# Patient Record
Sex: Male | Born: 1945 | Race: White | Hispanic: No | Smoking: Former smoker
Health system: Southern US, Community
[De-identification: ages and names within clinical notes are randomized; demographics above are authoritative.]

## PROBLEM LIST (undated history)

## (undated) DIAGNOSIS — I219 Acute myocardial infarction, unspecified: Secondary | ICD-10-CM

## (undated) DIAGNOSIS — D759 Disease of blood and blood-forming organs, unspecified: Secondary | ICD-10-CM

## (undated) DIAGNOSIS — I10 Essential (primary) hypertension: Secondary | ICD-10-CM

## (undated) DIAGNOSIS — C801 Malignant (primary) neoplasm, unspecified: Secondary | ICD-10-CM

## (undated) DIAGNOSIS — J189 Pneumonia, unspecified organism: Secondary | ICD-10-CM

## (undated) DIAGNOSIS — M1990 Unspecified osteoarthritis, unspecified site: Secondary | ICD-10-CM

## (undated) DIAGNOSIS — I2510 Atherosclerotic heart disease of native coronary artery without angina pectoris: Secondary | ICD-10-CM

## (undated) DIAGNOSIS — K219 Gastro-esophageal reflux disease without esophagitis: Secondary | ICD-10-CM

## (undated) DIAGNOSIS — E785 Hyperlipidemia, unspecified: Secondary | ICD-10-CM

---
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
## 1998-04-30 DIAGNOSIS — I219 Acute myocardial infarction, unspecified: Secondary | ICD-10-CM

## 1998-04-30 HISTORY — DX: Acute myocardial infarction, unspecified: I21.9

## 1998-04-30 HISTORY — PX: CORONARY ARTERY BYPASS GRAFT: SHX141

## 1998-08-15 ENCOUNTER — Inpatient Hospital Stay (HOSPITAL_COMMUNITY): Admission: EM | Admit: 1998-08-15 | Discharge: 1998-08-25 | Payer: Self-pay | Admitting: Emergency Medicine

## 1998-08-15 ENCOUNTER — Encounter: Payer: Self-pay | Admitting: Cardiology

## 1998-08-17 ENCOUNTER — Encounter: Payer: Self-pay | Admitting: Cardiology

## 1998-08-18 ENCOUNTER — Encounter: Payer: Self-pay | Admitting: Thoracic Surgery (Cardiothoracic Vascular Surgery)

## 1998-08-18 ENCOUNTER — Encounter: Payer: Self-pay | Admitting: Cardiology

## 1998-08-19 ENCOUNTER — Encounter: Payer: Self-pay | Admitting: Thoracic Surgery (Cardiothoracic Vascular Surgery)

## 1998-08-20 ENCOUNTER — Encounter: Payer: Self-pay | Admitting: Thoracic Surgery (Cardiothoracic Vascular Surgery)

## 1998-08-22 ENCOUNTER — Encounter: Payer: Self-pay | Admitting: Thoracic Surgery (Cardiothoracic Vascular Surgery)

## 1998-10-18 ENCOUNTER — Encounter (HOSPITAL_COMMUNITY): Admission: RE | Admit: 1998-10-18 | Discharge: 1999-01-16 | Payer: Self-pay | Admitting: Cardiology

## 1999-01-17 ENCOUNTER — Encounter (HOSPITAL_COMMUNITY): Admission: RE | Admit: 1999-01-17 | Discharge: 1999-04-17 | Payer: Self-pay | Admitting: Cardiology

## 1999-10-19 ENCOUNTER — Emergency Department (HOSPITAL_COMMUNITY): Admission: EM | Admit: 1999-10-19 | Discharge: 1999-10-19 | Payer: Self-pay | Admitting: Emergency Medicine

## 1999-10-19 ENCOUNTER — Encounter: Payer: Self-pay | Admitting: Emergency Medicine

## 2002-05-15 ENCOUNTER — Encounter: Admission: RE | Admit: 2002-05-15 | Discharge: 2002-05-15 | Payer: Self-pay | Admitting: Occupational Medicine

## 2002-05-15 ENCOUNTER — Encounter: Payer: Self-pay | Admitting: Occupational Medicine

## 2002-12-14 ENCOUNTER — Encounter: Payer: Self-pay | Admitting: Emergency Medicine

## 2002-12-14 ENCOUNTER — Emergency Department (HOSPITAL_COMMUNITY): Admission: EM | Admit: 2002-12-14 | Discharge: 2002-12-14 | Payer: Self-pay | Admitting: Emergency Medicine

## 2004-02-25 ENCOUNTER — Ambulatory Visit (HOSPITAL_COMMUNITY): Admission: RE | Admit: 2004-02-25 | Discharge: 2004-02-25 | Payer: Self-pay | Admitting: Family Medicine

## 2004-07-08 ENCOUNTER — Emergency Department (HOSPITAL_COMMUNITY): Admission: EM | Admit: 2004-07-08 | Discharge: 2004-07-08 | Payer: Self-pay | Admitting: Emergency Medicine

## 2006-05-24 ENCOUNTER — Emergency Department (HOSPITAL_COMMUNITY): Admission: EM | Admit: 2006-05-24 | Discharge: 2006-05-24 | Payer: Self-pay | Admitting: Family Medicine

## 2009-06-14 ENCOUNTER — Emergency Department (HOSPITAL_COMMUNITY): Admission: EM | Admit: 2009-06-14 | Discharge: 2009-06-14 | Payer: Self-pay | Admitting: Emergency Medicine

## 2011-09-27 ENCOUNTER — Other Ambulatory Visit: Payer: Self-pay | Admitting: Family Medicine

## 2011-09-27 ENCOUNTER — Ambulatory Visit
Admission: RE | Admit: 2011-09-27 | Discharge: 2011-09-27 | Disposition: A | Discharge status: Home / Self Care | Payer: BC Managed Care – PPO | Source: Ambulatory Visit | Attending: Family Medicine | Admitting: Family Medicine

## 2011-09-27 DIAGNOSIS — T1490XA Injury, unspecified, initial encounter: Secondary | ICD-10-CM

## 2012-07-23 ENCOUNTER — Ambulatory Visit
Admission: RE | Admit: 2012-07-23 | Discharge: 2012-07-23 | Disposition: A | Discharge status: Home / Self Care | Payer: BC Managed Care – PPO | Source: Ambulatory Visit | Attending: Physician Assistant | Admitting: Physician Assistant

## 2012-07-23 ENCOUNTER — Other Ambulatory Visit: Payer: Self-pay | Admitting: Physician Assistant

## 2012-07-23 DIAGNOSIS — R109 Unspecified abdominal pain: Secondary | ICD-10-CM

## 2013-02-03 ENCOUNTER — Observation Stay (HOSPITAL_COMMUNITY)
Admission: EM | Admit: 2013-02-03 | Discharge: 2013-02-04 | Disposition: A | Discharge status: Home / Self Care | Payer: Medicare Other | Attending: Cardiology | Admitting: Cardiology

## 2013-02-03 ENCOUNTER — Emergency Department (HOSPITAL_COMMUNITY): Payer: Medicare Other

## 2013-02-03 ENCOUNTER — Encounter (HOSPITAL_COMMUNITY): Payer: Self-pay | Admitting: Nurse Practitioner

## 2013-02-03 ENCOUNTER — Other Ambulatory Visit: Payer: Self-pay

## 2013-02-03 DIAGNOSIS — R079 Chest pain, unspecified: Secondary | ICD-10-CM

## 2013-02-03 DIAGNOSIS — E785 Hyperlipidemia, unspecified: Secondary | ICD-10-CM | POA: Insufficient documentation

## 2013-02-03 DIAGNOSIS — Z951 Presence of aortocoronary bypass graft: Secondary | ICD-10-CM | POA: Insufficient documentation

## 2013-02-03 DIAGNOSIS — I2510 Atherosclerotic heart disease of native coronary artery without angina pectoris: Principal | ICD-10-CM | POA: Insufficient documentation

## 2013-02-03 DIAGNOSIS — I10 Essential (primary) hypertension: Secondary | ICD-10-CM | POA: Insufficient documentation

## 2013-02-03 DIAGNOSIS — E669 Obesity, unspecified: Secondary | ICD-10-CM | POA: Insufficient documentation

## 2013-02-03 DIAGNOSIS — I200 Unstable angina: Secondary | ICD-10-CM | POA: Insufficient documentation

## 2013-02-03 DIAGNOSIS — I252 Old myocardial infarction: Secondary | ICD-10-CM | POA: Insufficient documentation

## 2013-02-03 HISTORY — DX: Acute myocardial infarction, unspecified: I21.9

## 2013-02-03 HISTORY — DX: Atherosclerotic heart disease of native coronary artery without angina pectoris: I25.10

## 2013-02-03 HISTORY — DX: Disease of blood and blood-forming organs, unspecified: D75.9

## 2013-02-03 HISTORY — DX: Pneumonia, unspecified organism: J18.9

## 2013-02-03 HISTORY — DX: Gastro-esophageal reflux disease without esophagitis: K21.9

## 2013-02-03 HISTORY — DX: Unspecified osteoarthritis, unspecified site: M19.90

## 2013-02-03 HISTORY — DX: Hyperlipidemia, unspecified: E78.5

## 2013-02-03 HISTORY — DX: Essential (primary) hypertension: I10

## 2013-02-03 LAB — PROTIME-INR
INR: 0.93 (ref 0.00–1.49)
Prothrombin Time: 11.8 seconds (ref 11.6–15.2)
Prothrombin Time: 12.3 seconds (ref 11.6–15.2)

## 2013-02-03 LAB — CBC
HCT: 38.2 % — ABNORMAL LOW (ref 39.0–52.0)
Hemoglobin: 12.9 g/dL — ABNORMAL LOW (ref 13.0–17.0)
RBC: 4.16 MIL/uL — ABNORMAL LOW (ref 4.22–5.81)
RDW: 14.1 % (ref 11.5–15.5)
WBC: 6.3 10*3/uL (ref 4.0–10.5)

## 2013-02-03 LAB — POCT I-STAT TROPONIN I

## 2013-02-03 LAB — COMPREHENSIVE METABOLIC PANEL
AST: 33 U/L (ref 0–37)
Albumin: 3.2 g/dL — ABNORMAL LOW (ref 3.5–5.2)
Albumin: 3.4 g/dL — ABNORMAL LOW (ref 3.5–5.2)
Alkaline Phosphatase: 54 U/L (ref 39–117)
BUN: 13 mg/dL (ref 6–23)
BUN: 13 mg/dL (ref 6–23)
CO2: 23 mEq/L (ref 19–32)
Chloride: 106 mEq/L (ref 96–112)
Chloride: 108 mEq/L (ref 96–112)
Creatinine, Ser: 0.95 mg/dL (ref 0.50–1.35)
GFR calc Af Amer: >90 mL/min (ref >90)
Potassium: 4.1 mEq/L (ref 3.5–5.1)
Total Bilirubin: 0.4 mg/dL (ref 0.3–1.2)
Total Bilirubin: 0.4 mg/dL (ref 0.3–1.2)

## 2013-02-03 LAB — TSH: TSH: 3.477 u[IU]/mL (ref 0.350–4.500)

## 2013-02-03 LAB — CBC WITH DIFFERENTIAL/PLATELET
Basophils Absolute: 0.1 10*3/uL (ref 0.0–0.1)
Eosinophils Relative: 5 % (ref 0–5)
Lymphocytes Relative: 30 % (ref 12–46)
Lymphs Abs: 2 10*3/uL (ref 0.7–4.0)
MCV: 92.6 fL (ref 78.0–100.0)
Monocytes Relative: 6 % (ref 3–12)
Neutro Abs: 4.1 10*3/uL (ref 1.7–7.7)
Platelets: 201 10*3/uL (ref 150–400)
RDW: 14.2 % (ref 11.5–15.5)
WBC: 6.8 10*3/uL (ref 4.0–10.5)

## 2013-02-03 LAB — APTT: aPTT: 27 seconds (ref 24–37)

## 2013-02-03 MED ORDER — SODIUM CHLORIDE 0.9 % IJ SOLN: 3.0000 mL | Freq: Two times a day (BID) | INTRAMUSCULAR | Status: DC

## 2013-02-03 MED ORDER — NITROGLYCERIN 0.4 MG SL SUBL: 0.4000 mg | SUBLINGUAL_TABLET | SUBLINGUAL | Status: DC | PRN

## 2013-02-03 MED ORDER — NITROGLYCERIN 2 % TD OINT
1.0000 [in_us] | TOPICAL_OINTMENT | Freq: Once | TRANSDERMAL | Status: AC
  Administered 2013-02-03: 1 [in_us] via TOPICAL
  Filled 2013-02-03: qty 1

## 2013-02-03 MED ORDER — MORPHINE SULFATE 4 MG/ML IJ SOLN
4.0000 mg | Freq: Once | INTRAMUSCULAR | Status: AC
  Administered 2013-02-03: 4 mg via INTRAVENOUS
  Filled 2013-02-03: qty 1

## 2013-02-03 MED ORDER — SODIUM CHLORIDE 0.9 % IV SOLN
1000.0000 mL | INTRAVENOUS | Status: DC
  Administered 2013-02-03: 1000 mL via INTRAVENOUS

## 2013-02-03 MED ORDER — ONDANSETRON HCL 4 MG/2ML IJ SOLN: 4.0000 mg | Freq: Four times a day (QID) | INTRAMUSCULAR | Status: DC | PRN

## 2013-02-03 MED ORDER — SODIUM CHLORIDE 0.9 % IJ SOLN: 3.0000 mL | INTRAMUSCULAR | Status: DC | PRN

## 2013-02-03 MED ORDER — HEPARIN (PORCINE) IN NACL 100-0.45 UNIT/ML-% IJ SOLN
1200.0000 [IU]/h | INTRAMUSCULAR | Status: DC
  Filled 2013-02-03: qty 250

## 2013-02-03 MED ORDER — MORPHINE SULFATE 2 MG/ML IJ SOLN: 2.0000 mg | INTRAMUSCULAR | Status: DC | PRN

## 2013-02-03 MED ORDER — NITROGLYCERIN 0.4 MG SL SUBL
0.4000 mg | SUBLINGUAL_TABLET | SUBLINGUAL | Status: DC | PRN
  Administered 2013-02-03: 0.4 mg via SUBLINGUAL

## 2013-02-03 MED ORDER — HEPARIN BOLUS VIA INFUSION
3000.0000 [IU] | Freq: Once | INTRAVENOUS | Status: DC
  Filled 2013-02-03: qty 3000

## 2013-02-03 MED ORDER — ACETAMINOPHEN 325 MG PO TABS: 650.0000 mg | ORAL_TABLET | ORAL | Status: DC | PRN

## 2013-02-03 MED ORDER — ASPIRIN 81 MG PO CHEW
324.0000 mg | CHEWABLE_TABLET | Freq: Once | ORAL | Status: DC
  Filled 2013-02-03 (×2): qty 4

## 2013-02-03 MED ORDER — SODIUM CHLORIDE 0.9 % IV SOLN: 250.0000 mL | INTRAVENOUS | Status: DC | PRN

## 2013-02-03 NOTE — H&P (Addendum)
Admit date: 02/03/2013 Referring Physician Dr. Lynelle Doctor Primary Cardiologist Dr. Anne Fu Chief complaint/reason for admission:chest pain  HPI: This is a 67yo WM with a history of CAD and CABG who was in his USOH when he develeoped chest pain this am while eating breakfast.   The pain was similar to his pain with his MI in the past but less severe.  The pain radiated into his arms bilaterally.  He denied any SOB, nausea or diaphoresis.  He took a SL NTG with no improvement and came to the ER.  Currently he is pain free.  He sees Dr. Anne Fu and recently had a nuclear stress test although he had not been having any anginal symptoms.  He also has been having some nose bleeds over the past month and had one the other night.    PMH:    Past Medical History  Diagnosis Date  . Myocardial infarct   . Hypertension   . Coronary artery disease     s/p CABG  . Hyperlipidemia     PSH:    Past Surgical History  Procedure Laterality Date  . Coronary artery bypass graft      ALLERGIES:   Review of patient's allergies indicates no known allergies.  Prior to Admit Meds:   (Not in a hospital admission) Family HX:    Family History  Problem Relation Age of Onset  . Heart disease Mother   . Heart disease Father    Social HX:    History   Social History  . Marital Status: Divorced    Spouse Name: N/A    Number of Children: N/A  . Years of Education: N/A   Occupational History  . Not on file.   Social History Main Topics  . Smoking status: Former Games developer  . Smokeless tobacco: Not on file  . Alcohol Use: No  . Drug Use: No  . Sexual Activity: Not on file   Other Topics Concern  . Not on file   Social History Narrative  . No narrative on file     ROS:  All 11 ROS were addressed and are negative except what is stated in the HPI  PHYSICAL EXAM Filed Vitals:   02/03/13 1200  BP: 106/57  Pulse: 46  Temp:   Resp: 15   General: Well developed, well nourished, in no acute distress Head:  Eyes PERRLA, No xanthomas.   Normal cephalic and atramatic  Lungs:   Clear bilaterally to auscultation and percussion. Heart:   HRRR S1 S2 Pulses are 2+ & equal.            No carotid bruit. No JVD.  No abdominal bruits. No femoral bruits. Abdomen: Bowel sounds are positive, abdomen soft and non-tender without masses  Extremities:   No clubbing, cyanosis or edema.  DP +1 Neuro: Alert and oriented X 3. Psych:  Good affect, responds appropriately   Labs:   Lab Results  Component Value Date   WBC 6.3 02/03/2013   HGB 12.9* 02/03/2013   HCT 38.2* 02/03/2013   MCV 91.8 02/03/2013   PLT 195 02/03/2013    Recent Labs Lab 02/03/13 1010  NA 139  K 4.1  CL 106  CO2 23  BUN 13  CREATININE 0.94  CALCIUM 8.7  PROT 6.2  BILITOT 0.4  ALKPHOS 54  ALT 36  AST 32  GLUCOSE 99   No results found for this basename: CKTOTAL, CKMB, CKMBINDEX, TROPONINI   No results found for this basename: PTT  Lab Results  Component Value Date   INR 0.88 02/03/2013         Radiology:  CLINICAL DATA: Chest pain  EXAM:  PORTABLE CHEST - 1 VIEW  COMPARISON: None  FINDINGS:  Cardiomediastinal silhouette is unremarkable. Status post CABG. No  acute infiltrate or pleural effusion. No pulmonary edema.  IMPRESSION:  No active disease. Status post CABG.    EKG:  NSR with sinus arrhythmia  ASSESSMENT:  1.  Chest pain with no ischemic changes on EKG.  Currently pain free.  Initial troponin is normal. 2.  CAD with history of remote MI s/p CABG 3.  Dyslipidemia 4.  HTN 5.  Epistaxis - currently no bleeding  PLAN:   1.  Admit for 23 hour observation 2.  Cycle cardiac enzymes 3.  NPO after MN in case cardiac cath is needed 4.  Continue current medications 5.  Will hold on anticoagulation given recent epistaxis unless enzymes become positive 6.  Consider ENT eval  Quintella Reichert, MD  02/03/2013  12:42 PM

## 2013-02-03 NOTE — Progress Notes (Signed)
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 Cardiologist notified. Heparin drip d/c'd. Will continue to monitor the pt. Sanda Linger, RN

## 2013-02-03 NOTE — ED Notes (Signed)
Attempted report.  Awaiting return phone call. 

## 2013-02-03 NOTE — ED Notes (Signed)
Pt reports after waking this am he noticed pain in bilateral arms, took 1 nitro with no relief so he drove to ER. While driving here he began to have "burning pain in my heart." denies sob, nausea, diaphoresis.

## 2013-02-03 NOTE — ED Notes (Signed)
Report called to RN.

## 2013-02-03 NOTE — Progress Notes (Signed)
CRITICAL VALUE ALERT  Critical value received: Troponin 1.79  Date of notification:  02/03/2013  Time of notification:  2025  Critical value read back:yes  Nurse who received alert:  Eliane Decree RN  MD notified (1st page):  Cardiologist [place] Callas  Time of first page:  2026  MD notified (2nd page):  Time of second page:  Responding MD: Cardiologist Sharma,MD  Time MD responded:  2034  New orders to get a follow-up Ekg & VS. Also give nitro & start on IV hep.   Sanda Linger, RN

## 2013-02-03 NOTE — Progress Notes (Addendum)
Addendum 10/8 a.m. Heparin not started due to ?bleeding disorder as child. MD wants to continue to hold off on heparin. Troponins now negative. ?accuracy of first troponin.  Christoper Fabian, PharmD, BCPS Clinical pharmacist, pager [phone] 02/04/2013  9:33 AM   ANTICOAGULATION CONSULT NOTE - Initial Consult  Pharmacy Consult for Heparin Indication: chest pain/ACS  No Known Allergies  Patient Measurements: Height: 5\' 8"  (172.7 cm) Weight: 230 lb (104.327 kg) IBW/kg (Calculated) : 68.4 Heparin Dosing Weight: 91 kg  Vital Signs: Temp: 97.8 F (36.6 C) (10/07 1919) Temp src: Oral (10/07 1919) BP: 109/58 mmHg (10/07 1919) Pulse Rate: 63 (10/07 1919)  Labs:  Recent Labs  02/03/13 1010 02/03/13 1414  HGB 12.9* 13.0  HCT 38.2* 38.8*  PLT 195 201  APTT 27 27  LABPROT 11.8 12.3  INR 0.88 0.93  CREATININE 0.94 0.95  TROPONINI  --  1.79*    Estimated Creatinine Clearance: 87.2 ml/min (by C-G formula based on Cr of 0.95).   Medical History: Past Medical History  Diagnosis Date  . Hypertension   . Coronary artery disease     s/p CABG  . Hyperlipidemia   . Myocardial infarct 2000  . Pneumonia     "couple times" (02/03/2013)  . 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 GERD (gastroesophageal reflux disease)   . Arthritis     "hands, fingers" (02/03/2013)    Assessment: 67 y.o. M with significant cardiac history including prior CABG who presented to the [facility] on 10/7 with CP. Cards on board -- wanted to hold off on anticoagulation given recent epistaxis -- however troponins now positive and pharmacy consulted to start heparin while awaiting further cardiology work-up.   The patient has no hx CVA or recent surgeries. He is noted to have recent nose bleeds over the past month. Baseline Hgb/Hct/Plt okay. Hep Wt: 91 kg  Goal of Therapy:  Heparin level 0.3-0.7 units/ml Monitor platelets by  anticoagulation protocol: Yes   Plan:  1. Heparin bolus of 3000 units x 1 (lower bolus in setting of recent nosebleeds) 2. Initiate heparin drip at a rate of 1200 units/hr  3. Daily heparin levels 4.Will continue to monitor for any signs/symptoms of bleeding (including nosebleeds) and will follow up with heparin level in 6 hours   Georgina Pillion, PharmD, BCPS Clinical Pharmacist Pager: [phone] 02/03/2013 9:10 PM

## 2013-02-03 NOTE — ED Notes (Signed)
Pt brought back to room; pt getting undressed and into a gown at this time 

## 2013-02-03 NOTE — ED Provider Notes (Signed)
CSN: [id number]     Arrival date & time 02/03/13  0932 History  First MD Initiated Contact with Patient 02/03/13 [phone]     Chief Complaint  Patient presents with  . Chest Pain    The history is provided by the patient.  Pt was sitting down watching tv eating breakfast.  He suddenly had pain in both arms.  He also developed a burning in the left chest.  This started about 0900.  He took NTG but it did not immediately relieve it.  The arm pain is gone but the burning in the chest is still present.  The discomfort is mild.  No nausea or shortness of breath.  Hx of MI in 2000.  Pt had CABG in 2000.  No stents since that time. This episode today feels similar to his MI in the past.  [facility] cardiology is his PCP. He also took an ASA when it started. Past Medical History  Diagnosis Date  . Myocardial infarct   . Hypertension    Past Surgical History  Procedure Laterality Date  . Coronary artery bypass graft     History reviewed. No pertinent family history. History  Substance Use Topics  . Smoking status: Never Smoker   . Smokeless tobacco: Not on file  . Alcohol Use: No    Review of Systems  All other systems reviewed and are negative.    Allergies  Review of patient's allergies indicates no known allergies.  Home Medications   Current Outpatient Rx  Name  Route  Sig  Dispense  Refill  . dutasteride (AVODART) 0.5 MG capsule   Oral   Take 0.5 mg by mouth daily.         Marland Kitchen ezetimibe-simvastatin (VYTORIN) 10-40 MG per tablet   Oral   Take 1 tablet by mouth at bedtime.         . fish oil-omega-3 fatty acids 1000 MG capsule   Oral   Take 1 g by mouth daily.         Marland Kitchen lisinopril (PRINIVIL,ZESTRIL) 20 MG tablet   Oral   Take 20 tablets by mouth daily.         . Multiple Vitamin (MULTIVITAMIN WITH MINERALS) TABS tablet   Oral   Take 1 tablet by mouth daily.         Marland Kitchen omeprazole (PRILOSEC) 40 MG capsule   Oral   Take 40 mg by mouth daily.         .  tamsulosin (FLOMAX) 0.4 MG CAPS capsule   Oral   Take 0.4 mg by mouth daily.          BP 139/84  Pulse 46  Temp(Src) 97.8 F (36.6 C) (Oral)  Resp 13  SpO2 94% Physical Exam  Nursing note and vitals reviewed. Constitutional: He appears well-developed and well-nourished. No distress.  HENT:  Head: Normocephalic and atraumatic.  Right Ear: External ear normal.  Left Ear: External ear normal.  Eyes: Conjunctivae are normal. Right eye exhibits no discharge. Left eye exhibits no discharge. No scleral icterus.  Neck: Neck supple. No tracheal deviation present.  Cardiovascular: Normal rate, regular rhythm and intact distal pulses.   Pulmonary/Chest: Effort normal and breath sounds normal. No stridor. No respiratory distress. He has no wheezes. He has no rales.  Abdominal: Soft. Bowel sounds are normal. He exhibits no distension. There is no tenderness. There is no rebound and no guarding.  Musculoskeletal: He exhibits no edema and no tenderness.  Old vein graft  scars lower legs   Neurological: He is alert. He has normal strength. No sensory deficit. Cranial nerve deficit:  no gross defecits noted. He exhibits normal muscle tone. He displays no seizure activity. Coordination normal.  Skin: Skin is warm and dry. No rash noted.  Psychiatric: He has a normal mood and affect.    ED Course  Procedures (including critical care time) EKG Ratee 56 Sinus bradycardia with Premature atrial complexes Cannot rule out Inferior infarct , age undetermined Nonspecific t wave changes, nl st segments Abnormal ECG No prior EKG for comparison Labs Review Labs Reviewed  CBC - Abnormal; Notable for the following:    RBC 4.16 (*)    Hemoglobin 12.9 (*)    HCT 38.2 (*)    All other components within normal limits  COMPREHENSIVE METABOLIC PANEL - Abnormal; Notable for the following:    Albumin 3.2 (*)    GFR calc non Af Amer 84 (*)    All other components within normal limits  PROTIME-INR  APTT   POCT I-STAT TROPONIN I   Imaging Review Dg Chest Portable 1 View  02/03/2013   CLINICAL DATA:  Chest pain  EXAM: PORTABLE CHEST - 1 VIEW  COMPARISON:  None  FINDINGS: Cardiomediastinal silhouette is unremarkable. Status post CABG. No acute infiltrate or pleural effusion. No pulmonary edema.  IMPRESSION: No active disease. Status post CABG.   Electronically Signed   By: Natasha Mead M.D.   On: 02/03/2013 10:23   Medications  0.9 %  sodium chloride infusion (1,000 mLs Intravenous New Bag/Given 02/03/13 1007)  aspirin chewable tablet 324 mg (324 mg Oral Not Given 02/03/13 1009)  nitroGLYCERIN (NITROSTAT) SL tablet 0.4 mg (not administered)  nitroGLYCERIN (NITROGLYN) 2 % ointment 1 inch (1 inch Topical Given 02/03/13 1008)  morphine 4 MG/ML injection 4 mg (4 mg Intravenous Given 02/03/13 1008)    1108  Pain free.    MDM   1. Chest pain    Pt has history of CAD.  States last stress test was in the last 6 months and was normal however his pain today was similar to prior cardiac events.  Will consult with his cardiologist regarding possible admission rule out, serial testing.    Celene Kras, MD 02/03/13 1110

## 2013-02-03 NOTE — Progress Notes (Signed)
Pt c/o of 3-4 out of 10 LA pain non-radiating. Pt describes the pain as dull. EKG showed SB & VS were stable. Pt refusing to take anything at this time. Cardiologist on call Turner made aware. Pt informed to call if pain gets worse or is accompanied by diaphoresis or SOB. Will continue to monitor the pt. Sanda Linger, RN

## 2013-02-03 NOTE — Progress Notes (Signed)
New orders to give nitro & if the arm pain gets worse or persist to give morphine. Pt was given 1 nitro at 2121 arm pain was a 3 out of 10. VS charted & stable. Pt's pain level decreased to 2 out of 10 but bp also dropped to 98/56 so the 2nd nitro was not given. Will notify cardiologist on call & continue to monitor the pt. Sanda Linger, RN

## 2013-02-03 NOTE — Progress Notes (Signed)
Utilization review completed.  

## 2013-02-04 ENCOUNTER — Telehealth: Payer: Self-pay | Admitting: Nurse Practitioner

## 2013-02-04 ENCOUNTER — Encounter (HOSPITAL_COMMUNITY)
Admission: EM | Disposition: A | Discharge status: Home / Self Care | Payer: Self-pay | Source: Home / Self Care | Attending: Emergency Medicine

## 2013-02-04 DIAGNOSIS — I2510 Atherosclerotic heart disease of native coronary artery without angina pectoris: Secondary | ICD-10-CM

## 2013-02-04 HISTORY — PX: LEFT HEART CATHETERIZATION WITH CORONARY/GRAFT ANGIOGRAM: SHX5450

## 2013-02-04 LAB — LIPID PANEL
Cholesterol: 157 mg/dL (ref 0–200)
HDL: 53 mg/dL (ref >39)
LDL Cholesterol: 74 mg/dL (ref 0–99)
Total CHOL/HDL Ratio: 3 RATIO
Triglycerides: 151 mg/dL — ABNORMAL HIGH (ref <150)
VLDL: 30 mg/dL (ref 0–40)

## 2013-02-04 LAB — TROPONIN I: Troponin I: <0.3 ng/mL (ref <0.30)

## 2013-02-04 LAB — BASIC METABOLIC PANEL
BUN: 15 mg/dL (ref 6–23)
CO2: 27 mEq/L (ref 19–32)
Calcium: 8.8 mg/dL (ref 8.4–10.5)
Chloride: 107 mEq/L (ref 96–112)
Creatinine, Ser: 1.1 mg/dL (ref 0.50–1.35)
GFR calc Af Amer: 78 mL/min — ABNORMAL LOW (ref >90)

## 2013-02-04 SURGERY — LEFT HEART CATHETERIZATION WITH CORONARY/GRAFT ANGIOGRAM
Anesthesia: LOCAL

## 2013-02-04 MED ORDER — NITROGLYCERIN 0.4 MG SL SUBL: 0.4000 mg | SUBLINGUAL_TABLET | SUBLINGUAL | Status: DC | PRN

## 2013-02-04 MED ORDER — LIDOCAINE HCL (PF) 1 % IJ SOLN
INTRAMUSCULAR | Status: AC
  Filled 2013-02-04: qty 30

## 2013-02-04 MED ORDER — ISOSORBIDE MONONITRATE ER 30 MG PO TB24: 30.0000 mg | ORAL_TABLET | Freq: Every day | ORAL | Status: DC

## 2013-02-04 MED ORDER — HEPARIN (PORCINE) IN NACL 2-0.9 UNIT/ML-% IJ SOLN
INTRAMUSCULAR | Status: AC
  Filled 2013-02-04: qty 1000

## 2013-02-04 MED ORDER — VERAPAMIL HCL 2.5 MG/ML IV SOLN
INTRAVENOUS | Status: AC
  Filled 2013-02-04: qty 2

## 2013-02-04 MED ORDER — HEPARIN SODIUM (PORCINE) 1000 UNIT/ML IJ SOLN
INTRAMUSCULAR | Status: AC
  Filled 2013-02-04: qty 1

## 2013-02-04 MED ORDER — MIDAZOLAM HCL 2 MG/2ML IJ SOLN
INTRAMUSCULAR | Status: AC
  Filled 2013-02-04: qty 2

## 2013-02-04 MED ORDER — ACETAMINOPHEN 325 MG PO TABS: 650.0000 mg | ORAL_TABLET | ORAL | Status: DC | PRN

## 2013-02-04 MED ORDER — SODIUM CHLORIDE 0.9 % IV SOLN
1.0000 mL/kg/h | INTRAVENOUS | Status: AC
  Administered 2013-02-04: 1 mL/kg/h via INTRAVENOUS

## 2013-02-04 MED ORDER — ASPIRIN 81 MG PO CHEW: 324.0000 mg | CHEWABLE_TABLET | ORAL | Status: DC

## 2013-02-04 MED ORDER — ONDANSETRON HCL 4 MG/2ML IJ SOLN: 4.0000 mg | Freq: Four times a day (QID) | INTRAMUSCULAR | Status: DC | PRN

## 2013-02-04 MED ORDER — SODIUM CHLORIDE 0.9 % IV SOLN: 250.0000 mL | INTRAVENOUS | Status: DC | PRN

## 2013-02-04 MED ORDER — SODIUM CHLORIDE 0.9 % IJ SOLN: 3.0000 mL | INTRAMUSCULAR | Status: DC | PRN

## 2013-02-04 MED ORDER — FENTANYL CITRATE 0.05 MG/ML IJ SOLN
INTRAMUSCULAR | Status: AC
  Filled 2013-02-04: qty 2

## 2013-02-04 MED ORDER — SODIUM CHLORIDE 0.9 % IV SOLN: INTRAVENOUS | Status: DC

## 2013-02-04 MED ORDER — NITROGLYCERIN 0.2 MG/ML ON CALL CATH LAB
INTRAVENOUS | Status: AC
  Filled 2013-02-04: qty 1

## 2013-02-04 MED ORDER — SODIUM CHLORIDE 0.9 % IJ SOLN: 3.0000 mL | Freq: Two times a day (BID) | INTRAMUSCULAR | Status: DC

## 2013-02-04 MED ORDER — ASPIRIN EC 81 MG PO TBEC: 81.0000 mg | DELAYED_RELEASE_TABLET | Freq: Every day | ORAL | Status: DC

## 2013-02-04 NOTE — CV Procedure (Signed)
    CARDIAC CATHETERIZATION  PROCEDURE:  Left heart catheterization with selective coronary angiography, left ventriculogram via the left radial artery approach  INDICATIONS:  67 year old male with non-ST elevation myocardial infarction, isolated troponin of 1.7 with subsequent normal. Question spurious lab. Chest discomfort similar to his prior anginal episodes. He is nuclear stress test in April, low-risk but no ischemia. Normal ejection fraction. With his prior coronary artery disease status post bypass, he was brought to the catheterization lab for further evaluation.  The risks, benefits, and details of the procedure were explained to the patient, including possibilities of stroke, heart attack, death, renal impairment, arterial damage, bleeding.  The patient verbalized understanding and wanted to proceed.  Informed written consent was obtained.  PROCEDURE TECHNIQUE:  Allen's test was performed pre-and post procedure and was normal. The left radial artery site was prepped and draped in a sterile fashion. One percent lidocaine was used for local anesthesia. Using the modified Seldinger technique a 5 French hydrophilic sheath was inserted into the radial artery without difficulty. 3 mg of verapamil was administered via the sheath.  After traversing the aortic arch, 5000 units of heparin IV was administered. A LIMA catheter was used to selectively cannulate the LIMA graft, the native right coronary artery as well. Multiple views with Omnipaque. A Judkins right #4 catheter with the guidance of a Versicore wire was placed in the diagonal graft. A left bypass catheter was then used to cannulate the obtuse marginal graft. A Judkins left #4 catheter was used to selectively cannulate the left main artery. Multiple views with hand injection of Omnipaque were obtained. Catheter a pigtail catheter was used to cross into the left ventricle, hemodynamics were obtained, and a left ventriculogram was performed in the  RAO position with power injection. Following the procedure, sheath was removed, patient was hemodynamically stable, hemostasis was maintained with a Terumo T band.   CONTRAST:  Total of 120 ml.    FLOUROSCOPY TIME: 9 min.  COMPLICATIONS:  None.    HEMODYNAMICS:  Aortic pressure was 145/70 mmHg; LV systolic pressure was 140 mmHg; LVEDP 19 mmHg.  There was no gradient between the left ventricle and aorta.    ANGIOGRAPHIC DATA:    Left main: Widely patent branching into LAD and circumflex.  Left anterior descending (LAD): Occluded proximally, filling via the LIMA to LAD graft which is widely patent.  Circumflex artery (CIRC): Occluded in its midportion. SVG to ramus/OM graft widely patent. Native circumflex is ectatic.  Right coronary artery (RCA): Diffusely disease/calcified however no flow limiting stenosis identified. Gives rise to the PDA. Free RIMA to RCA graft is atretic.  LEFT VENTRICULOGRAM:  Left ventricular angiogram was done in the 30 RAO projection and revealed normal left ventricular wall motion and systolic function with an estimated ejection fraction of 55%.   IMPRESSIONS:  Severe native vessel coronary artery disease with patent I passed grafts except for atretic free RIMA to RCA Normal left ventricular systolic function.  LVEDP 19 mmHg.  Ejection fraction 55%.  RECOMMENDATION:  Continue with aggressive medical management. It is likely that the isolated troponin was spurious. It is my doubt now that this represented true acute coronary syndrome. We will see back in clinic in 10-14 days. I feel comfortable allowing him to be discharged this evening if doing well postoperatively.

## 2013-02-04 NOTE — Progress Notes (Signed)
Subjective:  Chest burning overnight. Prior stress test April, no significant ischemia. He was worried that his pain was similar to that prior angina experienced. Objective:  Vital Signs in the last 24 hours: Temp:  [97.4 F (36.3 C)-97.8 F (36.6 C)] 97.7 F (36.5 C) (10/08 0615) Pulse Rate:  [45-104] 104 (10/08 0615) Resp:  [14-19] 18 (10/08 0615) BP: (98-125)/(56-96) 109/62 mmHg (10/08 0615) SpO2:  [95 %-99 %] 95 % (10/08 0615) Weight:  [230 lb (104.327 kg)] 230 lb (104.327 kg) (10/07 1401)  Intake/Output from previous day: 10/07 0701 - 10/08 0700 In: 360 [P.O.:360] Out: -    Physical Exam: General: Well developed, well nourished, in no acute distress. Head:  Normocephalic and atraumatic. Lungs: Clear to auscultation and percussion. Heart: Normal S1 and S2.  No murmur, rubs or gallops. Bypass scar noted Abdomen: soft, non-tender, positive bowel sounds. Extremities: No clubbing or cyanosis. No edema. Neurologic: Alert and oriented x 3.    Lab Results:  Recent Labs  02/03/13 1010 02/03/13 1414  WBC 6.3 6.8  HGB 12.9* 13.0  PLT 195 201    Recent Labs  02/03/13 1414 02/04/13 0635  NA 143 141  K 4.2 4.6  CL 108 107  CO2 26 27  GLUCOSE 107* 103*  BUN 13 15  CREATININE 0.95 1.10    Recent Labs  02/03/13 2023 02/04/13 0055  TROPONINI <0.30 <0.30   Hepatic Function Panel  Recent Labs  02/03/13 1414  PROT 6.5  ALBUMIN 3.4*  AST 33  ALT 38  ALKPHOS 58  BILITOT 0.4    Recent Labs  02/04/13 0635  CHOL 157   No results found for this basename: PROTIME,  in the last 72 hours  Imaging: Dg Chest Portable 1 View  02/03/2013   CLINICAL DATA:  Chest pain  EXAM: PORTABLE CHEST - 1 VIEW  COMPARISON:  None  FINDINGS: Cardiomediastinal silhouette is unremarkable. Status post CABG. No acute infiltrate or pleural effusion. No pulmonary edema.  IMPRESSION: No active disease. Status post CABG.   Electronically Signed   By: Natasha Mead M.D.   On: 02/03/2013  10:23   Personally viewed.   Telemetry: Sinus bradycardia, occasional PACs Personally viewed.   EKG:  No ST segment changes  Cardiac Studies:  Isolated troponin of 1.6, subsequent normal  Assessment/Plan:  Principal Problem:   Unstable angina Active Problems:   CAD (coronary artery disease)   HTN (hypertension)   Dyslipidemia   -I will plan for cardiac catheterization. We will try left radial approach for bypass grafts. Risks and benefits of procedure including stroke, heart attack, death, bleeding, renal manifestations have been discussed with patient and wife. He is willing to proceed. He continues to have anginal symptoms despite reassuring stress test in the past. He has severe coronary artery disease underlying.  Kimbley Sprague 02/04/2013, 12:14 PM

## 2013-02-04 NOTE — Interval H&P Note (Signed)
History and Physical Interval Note:  02/04/2013 2:00 PM  Isaiah Patterson  has presented today for surgery, with the diagnosis of cp  The various methods of treatment have been discussed with the patient and family. After consideration of risks, benefits and other options for treatment, the patient has consented to  Procedure(s): LEFT HEART CATHETERIZATION WITH CORONARY/GRAFT ANGIOGRAM (N/A) as a surgical intervention .  The patient's history has been reviewed, patient examined, no change in status, stable for surgery.  I have reviewed the patient's chart and labs.  Questions were answered to the patient's satisfaction.     SKAINS, MARK

## 2013-02-04 NOTE — Plan of Care (Signed)
Called by RN staff on 02/03/13 evening regarding a positive Troponin ~1.7.  Patient had some c/o arm discomfort.  Repeat ECG with no clear diagnostic changes from prior.  Patient was treated with NTG-SL and IV morphine.  Upon further history-taking patient with unclear h/o bleeding disorder.  Patient states that he was told in childhood that he cannot play sports due to "bleeding problems."  We will hold off on anticoagulating with heparin due to this history and this may need to be further clarified in AM by way of a Hematology consultation if an invasive coronary assessment strategy is to be taken.  Subsequent troponin's normalized on later checks. No anginal complaints throughout the rest of the night.  Christie Nottingham, MD Cardiology Fellow, [employer]

## 2013-02-04 NOTE — Telephone Encounter (Signed)
TCM - has appointment for f/u with Dr. Anne Fu 10/17 at 12:00

## 2013-02-04 NOTE — Discharge Summary (Signed)
Patient ID: Isaiah Patterson MRN: [id number] DOB/AGE: [birth year] 67 y.o.  Admit date: 02/03/2013 Discharge date: 02/04/2013  Primary Discharge Diagnosis: Unstable angina Secondary Discharge Diagnosis: Severe coronary artery disease status post bypass, obesity, hypertension, hyperlipidemia  Significant Diagnostic Studies:   Cardiac catheterization 02/04/13: No intervention required. Atretic Free RIMA to RCA, patent SVG to diagonal, patent SVG to ramus/OM, patent LIMA to LAD. Normal ejection fraction 55%.   Hospital Course: 67 year old male with known coronary artery disease status post bypass who developed chest discomfort while eating breakfast yesterday morning that was similar to his pain with his prior myocardial infarction but less severe. His pain radiated to his arms bilaterally. Burning-like. No associated shortness of breath or diaphoresis. No relief with nitroglycerin. He's been having nosebleeds as well recently.  His first troponin was normal but his second troponin was 1.7. Subsequent troponins after that actually were normal.  He was brought to the catheterization lab the subsequent morning where findings were as above. No PCI.  Isosorbide was started. Discussion with family.  Overall reassuring cardiac catheterization however I did explain to he and his family that he still has native severe coronary artery disease and may very well have some angina symptoms. We will continue to treat medically. Fitness/lifestyle modification is very important for him. Weight loss is very important.   Discharge Exam: Blood pressure 112/87, pulse 58, temperature 97.9 F (36.6 C), temperature source Oral, resp. rate 20, height 5\' 8"  (1.727 m), weight 230 lb (104.327 kg), SpO2 98.00%.    Left radial artery cath site clean, dry, intact, normal pulse Cardiac regular rate and rhythm, no murmurs, no JVD Lungs clear to auscultation bilaterally Abdomen, obese nontender Neurologic: Ambulate  well. Labs:   Lab Results  Component Value Date   WBC 6.8 02/03/2013   HGB 13.0 02/03/2013   HCT 38.8* 02/03/2013   MCV 92.6 02/03/2013   PLT 201 02/03/2013    Recent Labs Lab 02/03/13 1414 02/04/13 0635  NA 143 141  K 4.2 4.6  CL 108 107  CO2 26 27  BUN 13 15  CREATININE 0.95 1.10  CALCIUM 8.8 8.8  PROT 6.5  --   BILITOT 0.4  --   ALKPHOS 58  --   ALT 38  --   AST 33  --   GLUCOSE 107* 103*   Lab Results  Component Value Date   TROPONINI <0.30 02/04/2013    Lab Results  Component Value Date   CHOL 157 02/04/2013   Lab Results  Component Value Date   HDL 53 02/04/2013   Lab Results  Component Value Date   LDLCALC 74 02/04/2013   Lab Results  Component Value Date   TRIG 151* 02/04/2013   Lab Results  Component Value Date   CHOLHDL 3.0 02/04/2013   No results found for this basename: LDLDIRECT       EKG: No significant ST segment changes  FOLLOW UP PLANS AND APPOINTMENTS Discharge Orders   Future Appointments Provider Department Dept Phone   02/13/2013 12:00 PM Donato Schultz, MD [facility] [phone]   07/15/2013 9:00 AM Donato Schultz, MD [facility] [phone]   Future Orders Complete By Expires   Diet - low sodium heart healthy  As directed    Increase activity slowly  As directed        Medication List         aspirin EC 81 MG tablet  Take 1 tablet (81 mg total) by  mouth daily.     dutasteride 0.5 MG capsule  Commonly known as:  AVODART  Take 0.5 mg by mouth daily.     ezetimibe-simvastatin 10-40 MG per tablet  Commonly known as:  VYTORIN  Take 1 tablet by mouth at bedtime.     fish oil-omega-3 fatty acids 1000 MG capsule  Take 1 g by mouth daily.     isosorbide mononitrate 30 MG 24 hr tablet  Commonly known as:  IMDUR  Take 1 tablet (30 mg total) by mouth daily.     lisinopril 20 MG tablet  Commonly known as:  PRINIVIL,ZESTRIL  Take 20 tablets by mouth daily.     multivitamin with minerals Tabs  tablet  Take 1 tablet by mouth daily.     nitroGLYCERIN 0.4 MG SL tablet  Commonly known as:  NITROSTAT  Place 1 tablet (0.4 mg total) under the tongue every 5 (five) minutes as needed for chest pain.     omeprazole 40 MG capsule  Commonly known as:  PRILOSEC  Take 40 mg by mouth daily.     tamsulosin 0.4 MG Caps capsule  Commonly known as:  FLOMAX  Take 0.4 mg by mouth daily.           Follow-up Information   Follow up with Donato Schultz, MD. (Office will call with appointment in 10-14 days)    Specialty:  Cardiology   Contact information:   [address] [place] [address] [phone]      New medications are isosorbide 30 mg once a day. Klimaszewski-acting nitrate to see if this helps with his anginal symptoms.  After cardiac catheterization, if he is feeling well this evening, he may be discharged. Transitional care visit in 10-14 days.  BRING ALL MEDICATIONS WITH YOU TO FOLLOW UP APPOINTMENTS  Time spent with patient to include physician time:27min Signed: Donato Schultz 02/04/2013, 3:25 PM

## 2013-02-04 NOTE — Progress Notes (Signed)
Patient's TR band removed with no complications; radial site level 0.  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  Patient to be DC home. [place], Mitzi Hansen

## 2013-02-05 NOTE — Telephone Encounter (Signed)
Pt wrist site/ dry, denies pain. Knows of f/u app with dr Anne Fu and will call with further questions or concerns.

## 2013-02-11 ENCOUNTER — Encounter: Payer: Self-pay | Admitting: Cardiology

## 2013-02-13 ENCOUNTER — Encounter: Payer: Medicare Other | Admitting: Cardiology

## 2013-02-27 ENCOUNTER — Encounter: Payer: Self-pay | Admitting: Cardiology

## 2013-02-27 ENCOUNTER — Ambulatory Visit (INDEPENDENT_AMBULATORY_CARE_PROVIDER_SITE_OTHER): Payer: Medicare Other | Admitting: Cardiology

## 2013-02-27 VITALS — BP 110/70 | HR 88 | Ht 68.0 in | Wt 248.0 lb

## 2013-02-27 DIAGNOSIS — E669 Obesity, unspecified: Secondary | ICD-10-CM | POA: Insufficient documentation

## 2013-02-27 DIAGNOSIS — E785 Hyperlipidemia, unspecified: Secondary | ICD-10-CM

## 2013-02-27 DIAGNOSIS — I10 Essential (primary) hypertension: Secondary | ICD-10-CM

## 2013-02-27 DIAGNOSIS — I2510 Atherosclerotic heart disease of native coronary artery without angina pectoris: Secondary | ICD-10-CM

## 2013-02-27 NOTE — Patient Instructions (Signed)
Your physician recommends that you continue on your current medications as directed. Please refer to the Current Medication list given to you today.  Your physician wants you to follow-up in: 6 months with Dr. Skains. You will receive a reminder letter in the mail two months in advance. If you don't receive a letter, please call our office to schedule the follow-up appointment.  

## 2013-02-27 NOTE — Progress Notes (Signed)
[address] [place], [place]  [address] Phone: [phone] Fax:  [phone]  Date:  02/27/2013   ID:  Isaiah Patterson, DOB 1945-11-19, MRN [id number]  PCP:  REDMON,NOELLE, PA-C   History of Present Illness: Isaiah Patterson is a 67 y.o. male with coronary artery disease status post bypass with recent cardiac catheterization on 02/04/13 with no intervention required:  Atretic Free RIMA to RCA, patent SVG to diagonal, patent SVG to ramus/OM, patent LIMA to LAD. Normal ejection fraction 55%.   Hospital Course: 67 year old male with known coronary artery disease status post bypass who developed chest discomfort while eating breakfast yesterday morning that was similar to his pain with his prior myocardial infarction but less severe. His pain radiated to his arms bilaterally. Burning-like. No associated shortness of breath or diaphoresis. No relief with nitroglycerin. He's been having nosebleeds as well recently.  His first troponin was normal but his second troponin was 1.7. Subsequent troponins after that actually were normal.  He was brought to the catheterization lab the subsequent morning where findings were as above. No PCI.   Isosorbide was started. Discussion with family.  Overall reassuring cardiac catheterization however I did explain to he and his family that he still has native severe coronary artery disease and may very well have some angina symptoms. We will continue to treat medically. Fitness/lifestyle modification is very important for him. Weight loss is very important.  He actually states that he has not been taking his Imdur. He has not had any chest pain. He was quite pleased with the left radial approach catheterization.   Wt Readings from Last 3 Encounters:  02/27/13 248 lb (112.492 kg)  02/03/13 230 lb (104.327 kg)  02/03/13 230 lb (104.327 kg)     Past Medical History  Diagnosis Date  . Hypertension   . Coronary artery disease     s/p CABG    . Hyperlipidemia   . Myocardial infarct 2000  . Pneumonia     "couple times" (02/03/2013)  . 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 GERD (gastroesophageal reflux disease)   . Arthritis     "hands, fingers" (02/03/2013)    Past Surgical History  Procedure Laterality Date  . Tonsillectomy  1950's  . Coronary artery bypass graft  2000    "CABG X5" (02/03/2013)    Current Outpatient Prescriptions  Medication Sig Dispense Refill  . aspirin EC 81 MG tablet Take 1 tablet (81 mg total) by mouth daily.  30 tablet  11  . ezetimibe-simvastatin (VYTORIN) 10-40 MG per tablet Take 1 tablet by mouth at bedtime.      . fish oil-omega-3 fatty acids 1000 MG capsule Take 1 g by mouth daily.      . isosorbide mononitrate (IMDUR) 30 MG 24 hr tablet Take 1 tablet (30 mg total) by mouth daily.  30 tablet  11  . lisinopril (PRINIVIL,ZESTRIL) 20 MG tablet Take by mouth daily.       . Multiple Vitamin (MULTIVITAMIN WITH MINERALS) TABS tablet Take 1 tablet by mouth daily.      . nitroGLYCERIN (NITROSTAT) 0.4 MG SL tablet Place 1 tablet (0.4 mg total) under the tongue every 5 (five) minutes as needed for chest pain.  30 tablet  12  . omeprazole (PRILOSEC) 40 MG capsule Take 40 mg by mouth daily.      Marland Kitchen  tamsulosin (FLOMAX) 0.4 MG CAPS capsule Take 0.4 mg by mouth daily.       No current facility-administered medications for this visit.    Allergies:   No Known Allergies  Social History:  The patient  reports that he has quit smoking. His smoking use included Cigarettes. He has a 25 pack-year smoking history. He has never used smokeless tobacco. He reports that he drinks alcohol. He reports that he does not use illicit drugs.   ROS:  Please see the history of present illness.   Denies any bleeding, seemed to be, orthopnea, PND   PHYSICAL EXAM: VS:  BP 110/70  Pulse 88  Ht 5\' 8"  (1.727 m)  Wt 248 lb (112.492 kg)  BMI 37.72  kg/m2 Well nourished, well developed, in no acute distress HEENT: normal Neck: no JVD Cardiac:  normal S1, S2; RRR; no murmur Lungs:  clear to auscultation bilaterally, no wheezing, rhonchi or rales Abd: soft, nontender, no hepatomegalyObese Ext: no edema, left radial catheterization site well healed. Normal pulses Skin: warm and dry Neuro: no focal abnormalities noted      ASSESSMENT AND PLAN:  1. Severe native vessel coronary artery disease-status post bypass. Recent hospitalization October 2014 with start of isosorbide. Overall cardiac catheterization showed that free RIMA to RCA was atretic. Only one isolated troponin was positive the others were normal. Question spurious. Has not taken Imdur but is doing well. Continue to promote secondary prevention. 2. Hyperlipidemia-continue Vytorin 3. Hypertension-no changes in lisinopril. 4. Obesity-encourage weight loss, exercise  Signed, Donato Schultz, MD [facility]  02/27/2013 3:02 PM

## 2013-07-15 ENCOUNTER — Encounter: Payer: Self-pay | Admitting: Cardiology

## 2013-07-15 ENCOUNTER — Ambulatory Visit (INDEPENDENT_AMBULATORY_CARE_PROVIDER_SITE_OTHER): Payer: Medicare Other | Admitting: Cardiology

## 2013-07-15 VITALS — BP 120/70 | HR 58 | Ht 68.0 in | Wt 237.0 lb

## 2013-07-15 DIAGNOSIS — E669 Obesity, unspecified: Secondary | ICD-10-CM

## 2013-07-15 DIAGNOSIS — I2510 Atherosclerotic heart disease of native coronary artery without angina pectoris: Secondary | ICD-10-CM

## 2013-07-15 DIAGNOSIS — I10 Essential (primary) hypertension: Secondary | ICD-10-CM

## 2013-07-15 DIAGNOSIS — E7800 Pure hypercholesterolemia, unspecified: Secondary | ICD-10-CM

## 2013-07-15 NOTE — Patient Instructions (Signed)
Your physician wants you to follow-up in: 1 year with Dr. Skains You will receive a reminder letter in the mail two months in advance. If you don't receive a letter, please call our office to schedule the follow-up appointment.  Your physician recommends that you continue on your current medications as directed. Please refer to the Current Medication list given to you today.  

## 2013-07-15 NOTE — Progress Notes (Signed)
[place], [address] [place], [place]  [address] Phone: [phone] Fax:  [phone]  Date:  07/15/2013   ID:  Isaiah Patterson, DOB 23-Apr-1946, MRN [id number]  PCP:  REDMON,NOELLE, PA-C   History of Present Illness: Isaiah Patterson is a 68 y.o. male with coronary artery disease status post bypass with cardiac catheterization on 02/04/13 with no intervention required:  Atretic Free RIMA to RCA, patent SVG to diagonal, patent SVG to ramus/OM, patent LIMA to LAD. Normal ejection fraction 55%.   Hospital Course: 68 year old male with known coronary artery disease status post bypass who developed chest discomfort while eating breakfast yesterday morning that was similar to his pain with his prior myocardial infarction but less severe. His pain radiated to his arms bilaterally. Burning-like. No associated shortness of breath or diaphoresis. No relief with nitroglycerin. He's been having nosebleeds as well recently.  His first troponin was normal but his second troponin was 1.7. Subsequent troponins after that actually were normal.  He was brought to the catheterization lab the subsequent morning where findings were as above. No PCI.   Isosorbide was started.  Overall reassuring cardiac catheterization however I did explain to he and his family that he still has native severe coronary artery disease and may very well have some angina symptoms. We will continue to treat medically. Fitness/lifestyle modification is very important for him. Weight loss is very important.  Still working with [employer] on possible compensation.  07/15/13-no anginal symptoms, he has lost approximately 12 pounds, walking daily. Doing well.   Wt Readings from Last 3 Encounters:  07/15/13 237 lb (107.502 kg)  02/27/13 248 lb (112.492 kg)  02/03/13 230 lb (104.327 kg)     Past Medical History  Diagnosis Date  . Hypertension   . Coronary artery disease     s/p CABG  . Hyperlipidemia   . Myocardial infarct  2000  . Pneumonia     "couple times" (02/03/2013)  . 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 GERD (gastroesophageal reflux disease)   . Arthritis     "hands, fingers" (02/03/2013)    Past Surgical History  Procedure Laterality Date  . Tonsillectomy  1950's  . Coronary artery bypass graft  2000    "CABG X5" (02/03/2013)    Current Outpatient Prescriptions  Medication Sig Dispense Refill  . aspirin EC 81 MG tablet Take 1 tablet (81 mg total) by mouth daily.  30 tablet  11  . ezetimibe-simvastatin (VYTORIN) 10-40 MG per tablet Take 1 tablet by mouth at bedtime.      . fish oil-omega-3 fatty acids 1000 MG capsule Take 1 g by mouth daily.      . isosorbide mononitrate (IMDUR) 30 MG 24 hr tablet Take 1 tablet (30 mg total) by mouth daily.  30 tablet  11  . lisinopril (PRINIVIL,ZESTRIL) 20 MG tablet Take by mouth daily.       . Multiple Vitamin (MULTIVITAMIN WITH MINERALS) TABS tablet Take 1 tablet by mouth daily.      . nitroGLYCERIN (NITROSTAT) 0.4 MG SL tablet Place 1 tablet (0.4 mg total) under the tongue every 5 (five) minutes as needed for chest pain.  30 tablet  12  . omeprazole (PRILOSEC) 40 MG capsule Take 40 mg by mouth daily.      . tamsulosin (FLOMAX) 0.4 MG CAPS capsule Take 0.4 mg by  mouth daily.      . traZODone (DESYREL) 50 MG tablet        No current facility-administered medications for this visit.    Allergies:   No Known Allergies  Social History:  The patient  reports that he has quit smoking. His smoking use included Cigarettes. He has a 25 pack-year smoking history. He has never used smokeless tobacco. He reports that he drinks alcohol. He reports that he does not use illicit drugs.   ROS:  Please see the history of present illness.   Denies any bleeding, seemed to be, orthopnea, PND   PHYSICAL EXAM: VS:  BP 120/70  Pulse 58  Ht 5\' 8"  (1.727 m)  Wt 237 lb (107.502 kg)  BMI 36.04  kg/m2 Well nourished, well developed, in no acute distress HEENT: normal Neck: no JVD Cardiac:  normal S1, S2; RRR; no murmuroccasional ectopy Lungs:  clear to auscultation bilaterally, no wheezing, rhonchi or rales Abd: soft, nontender, no hepatomegalyObese Ext: no edema, left radial catheterization site well healed. Normal pulses Skin: warm and dry Neuro: no focal abnormalities noted   Labs: LDL 74, 10/14    ASSESSMENT AND PLAN:  1. Severe native vessel coronary artery disease-status post bypass. Recent hospitalization October 2014 with start of isosorbide. Overall cardiac catheterization showed that free RIMA to RCA was atretic. Only one isolated troponin was positive the others were normal. Question spurious.  Continue to promote secondary prevention. 2. Hyperlipidemia-continue Vytorin 3. Hypertension-no changes in lisinopril. 4. Obesity-encourage weight loss, exercise, doing a good job  Signed, Candee Furbish, MD [facility]  07/15/2013 9:50 AM

## 2014-04-08 ENCOUNTER — Encounter (HOSPITAL_COMMUNITY): Payer: Self-pay | Admitting: Cardiology

## 2017-09-06 ENCOUNTER — Emergency Department
Admission: EM | Admit: 2017-09-06 | Discharge: 2017-09-06 | Disposition: A | Discharge status: Home / Self Care | Payer: Medicare Other | Attending: Emergency Medicine | Admitting: Emergency Medicine

## 2017-09-06 ENCOUNTER — Encounter: Payer: Self-pay | Admitting: Emergency Medicine

## 2017-09-06 DIAGNOSIS — Z5321 Procedure and treatment not carried out due to patient leaving prior to being seen by health care provider: Secondary | ICD-10-CM | POA: Diagnosis not present

## 2017-09-06 DIAGNOSIS — R0989 Other specified symptoms and signs involving the circulatory and respiratory systems: Secondary | ICD-10-CM | POA: Diagnosis not present

## 2017-09-06 DIAGNOSIS — R040 Epistaxis: Secondary | ICD-10-CM | POA: Diagnosis not present

## 2017-09-06 NOTE — ED Notes (Signed)
Pt informed registration that he was leaving and no longer wanted to be evaluated.

## 2017-09-06 NOTE — ED Triage Notes (Signed)
Pt was eating at restaurant about an hour ago and blew his nose in the restroom and had "a lot of blood come out of his nose" and he feels like something is stuck in his throat right now.  Pt is unsure whether it is food or a clot from the epistaxis.  Pt is unsure if he is on blood thinners but says he takes 81mg  baby aspirin daily.  Pt has no sign of blood around his nose now.

## 2019-03-31 ENCOUNTER — Inpatient Hospital Stay (HOSPITAL_COMMUNITY)
Admission: EM | Admit: 2019-03-31 | Discharge: 2019-04-04 | DRG: 436 | Disposition: A | Discharge status: Home / Self Care | Payer: No Typology Code available for payment source | Attending: Internal Medicine | Admitting: Internal Medicine

## 2019-03-31 ENCOUNTER — Other Ambulatory Visit: Payer: Self-pay

## 2019-03-31 ENCOUNTER — Encounter (HOSPITAL_COMMUNITY): Payer: Self-pay | Admitting: Emergency Medicine

## 2019-03-31 ENCOUNTER — Emergency Department (HOSPITAL_COMMUNITY): Payer: No Typology Code available for payment source

## 2019-03-31 DIAGNOSIS — E278 Other specified disorders of adrenal gland: Secondary | ICD-10-CM | POA: Diagnosis present

## 2019-03-31 DIAGNOSIS — I2510 Atherosclerotic heart disease of native coronary artery without angina pectoris: Secondary | ICD-10-CM | POA: Diagnosis present

## 2019-03-31 DIAGNOSIS — K219 Gastro-esophageal reflux disease without esophagitis: Secondary | ICD-10-CM | POA: Diagnosis present

## 2019-03-31 DIAGNOSIS — K869 Disease of pancreas, unspecified: Secondary | ICD-10-CM | POA: Diagnosis present

## 2019-03-31 DIAGNOSIS — Z951 Presence of aortocoronary bypass graft: Secondary | ICD-10-CM | POA: Diagnosis not present

## 2019-03-31 DIAGNOSIS — Z7982 Long term (current) use of aspirin: Secondary | ICD-10-CM | POA: Diagnosis not present

## 2019-03-31 DIAGNOSIS — K831 Obstruction of bile duct: Secondary | ICD-10-CM

## 2019-03-31 DIAGNOSIS — I10 Essential (primary) hypertension: Secondary | ICD-10-CM | POA: Diagnosis present

## 2019-03-31 DIAGNOSIS — I252 Old myocardial infarction: Secondary | ICD-10-CM

## 2019-03-31 DIAGNOSIS — K8020 Calculus of gallbladder without cholecystitis without obstruction: Secondary | ICD-10-CM | POA: Diagnosis present

## 2019-03-31 DIAGNOSIS — Z791 Long term (current) use of non-steroidal anti-inflammatories (NSAID): Secondary | ICD-10-CM

## 2019-03-31 DIAGNOSIS — Z79899 Other long term (current) drug therapy: Secondary | ICD-10-CM | POA: Diagnosis not present

## 2019-03-31 DIAGNOSIS — Z8249 Family history of ischemic heart disease and other diseases of the circulatory system: Secondary | ICD-10-CM | POA: Diagnosis not present

## 2019-03-31 DIAGNOSIS — C259 Malignant neoplasm of pancreas, unspecified: Principal | ICD-10-CM | POA: Diagnosis present

## 2019-03-31 DIAGNOSIS — R7401 Elevation of levels of liver transaminase levels: Secondary | ICD-10-CM | POA: Diagnosis not present

## 2019-03-31 DIAGNOSIS — R748 Abnormal levels of other serum enzymes: Secondary | ICD-10-CM | POA: Diagnosis not present

## 2019-03-31 DIAGNOSIS — I159 Secondary hypertension, unspecified: Secondary | ICD-10-CM | POA: Diagnosis not present

## 2019-03-31 DIAGNOSIS — R17 Unspecified jaundice: Secondary | ICD-10-CM | POA: Diagnosis present

## 2019-03-31 DIAGNOSIS — Z20828 Contact with and (suspected) exposure to other viral communicable diseases: Secondary | ICD-10-CM | POA: Diagnosis present

## 2019-03-31 DIAGNOSIS — E785 Hyperlipidemia, unspecified: Secondary | ICD-10-CM | POA: Diagnosis present

## 2019-03-31 LAB — URINALYSIS, ROUTINE W REFLEX MICROSCOPIC
Bacteria, UA: NONE SEEN
Glucose, UA: NEGATIVE mg/dL
Ketones, ur: NEGATIVE mg/dL
Leukocytes,Ua: NEGATIVE
Nitrite: NEGATIVE
Protein, ur: NEGATIVE mg/dL
Specific Gravity, Urine: 1.012 (ref 1.005–1.030)
pH: 5 (ref 5.0–8.0)

## 2019-03-31 LAB — COMPREHENSIVE METABOLIC PANEL
ALT: 324 U/L — ABNORMAL HIGH (ref 0–44)
AST: 411 U/L — ABNORMAL HIGH (ref 15–41)
Albumin: 3 g/dL — ABNORMAL LOW (ref 3.5–5.0)
Alkaline Phosphatase: 1166 U/L — ABNORMAL HIGH (ref 38–126)
Anion gap: 10 (ref 5–15)
BUN: 8 mg/dL (ref 8–23)
CO2: 25 mmol/L (ref 22–32)
Calcium: 9.2 mg/dL (ref 8.9–10.3)
Chloride: 104 mmol/L (ref 98–111)
Creatinine, Ser: 0.9 mg/dL (ref 0.61–1.24)
GFR calc Af Amer: >60 mL/min (ref >60)
GFR calc non Af Amer: >60 mL/min (ref >60)
Glucose, Bld: 117 mg/dL — ABNORMAL HIGH (ref 70–99)
Potassium: 4.3 mmol/L (ref 3.5–5.1)
Sodium: 139 mmol/L (ref 135–145)
Total Bilirubin: 14.1 mg/dL — ABNORMAL HIGH (ref 0.3–1.2)
Total Protein: 6.9 g/dL (ref 6.5–8.1)

## 2019-03-31 LAB — CBC
HCT: 38.8 % — ABNORMAL LOW (ref 39.0–52.0)
Hemoglobin: 13.2 g/dL (ref 13.0–17.0)
MCH: 32 pg (ref 26.0–34.0)
MCHC: 34 g/dL (ref 30.0–36.0)
MCV: 94.2 fL (ref 80.0–100.0)
Platelets: 191 10*3/uL (ref 150–400)
RBC: 4.12 MIL/uL — ABNORMAL LOW (ref 4.22–5.81)
RDW: 16.6 % — ABNORMAL HIGH (ref 11.5–15.5)
WBC: 7.4 10*3/uL (ref 4.0–10.5)
nRBC: 0 % (ref 0.0–0.2)

## 2019-03-31 LAB — ACETAMINOPHEN LEVEL: Acetaminophen (Tylenol), Serum: <10 ug/mL — ABNORMAL LOW (ref 10–30)

## 2019-03-31 LAB — PROTIME-INR
INR: 1.1 (ref 0.8–1.2)
Prothrombin Time: 13.7 seconds (ref 11.4–15.2)

## 2019-03-31 LAB — HEPATITIS PANEL, ACUTE
HCV Ab: NONREACTIVE
Hep A IgM: NONREACTIVE
Hep B C IgM: NONREACTIVE
Hepatitis B Surface Ag: NONREACTIVE

## 2019-03-31 LAB — LIPASE, BLOOD: Lipase: 65 U/L — ABNORMAL HIGH (ref 11–51)

## 2019-03-31 LAB — HIV ANTIBODY (ROUTINE TESTING W REFLEX): HIV Screen 4th Generation wRfx: NONREACTIVE

## 2019-03-31 MED ORDER — NICOTINE 21 MG/24HR TD PT24: 21.0000 mg | MEDICATED_PATCH | Freq: Every day | TRANSDERMAL | Status: DC | PRN

## 2019-03-31 MED ORDER — DEXTROSE-NACL 5-0.45 % IV SOLN
INTRAVENOUS | Status: DC
  Administered 2019-03-31 – 2019-04-01 (×2): via INTRAVENOUS

## 2019-03-31 MED ORDER — ATORVASTATIN CALCIUM 80 MG PO TABS
80.0000 mg | ORAL_TABLET | Freq: Every day | ORAL | Status: DC
  Administered 2019-03-31: 21:00:00 80 mg via ORAL
  Filled 2019-03-31: qty 1

## 2019-03-31 MED ORDER — EZETIMIBE-SIMVASTATIN 10-40 MG PO TABS: 1.0000 | ORAL_TABLET | Freq: Every day | ORAL | Status: DC

## 2019-03-31 MED ORDER — ENOXAPARIN SODIUM 40 MG/0.4ML ~~LOC~~ SOLN
40.0000 mg | SUBCUTANEOUS | Status: DC
  Administered 2019-04-01 (×2): 40 mg via SUBCUTANEOUS
  Filled 2019-03-31: qty 0.4

## 2019-03-31 MED ORDER — PANTOPRAZOLE SODIUM 40 MG PO TBEC
40.0000 mg | DELAYED_RELEASE_TABLET | Freq: Every day | ORAL | Status: DC
  Administered 2019-04-01 – 2019-04-04 (×3): 40 mg via ORAL
  Filled 2019-03-31 (×4): qty 1

## 2019-03-31 MED ORDER — NITROGLYCERIN 0.4 MG SL SUBL: 0.4000 mg | SUBLINGUAL_TABLET | SUBLINGUAL | Status: DC | PRN

## 2019-03-31 MED ORDER — KETOROLAC TROMETHAMINE 15 MG/ML IJ SOLN
15.0000 mg | Freq: Four times a day (QID) | INTRAMUSCULAR | Status: DC | PRN
  Administered 2019-04-01 – 2019-04-02 (×2): 15 mg via INTRAVENOUS
  Filled 2019-03-31 (×2): qty 1

## 2019-03-31 MED ORDER — LISINOPRIL 20 MG PO TABS
20.0000 mg | ORAL_TABLET | Freq: Every day | ORAL | Status: DC
  Administered 2019-04-01 – 2019-04-04 (×4): 20 mg via ORAL
  Filled 2019-03-31: qty 1
  Filled 2019-03-31: qty 2
  Filled 2019-03-31 (×2): qty 1

## 2019-03-31 MED ORDER — TRAMADOL HCL 50 MG PO TABS
50.0000 mg | ORAL_TABLET | Freq: Four times a day (QID) | ORAL | Status: DC | PRN
  Administered 2019-04-01 – 2019-04-03 (×3): 50 mg via ORAL
  Filled 2019-03-31 (×3): qty 1

## 2019-03-31 MED ORDER — SENNA 8.6 MG PO TABS
1.0000 | ORAL_TABLET | Freq: Two times a day (BID) | ORAL | Status: DC
  Administered 2019-04-01 – 2019-04-04 (×5): 8.6 mg via ORAL
  Filled 2019-03-31 (×6): qty 1

## 2019-03-31 MED ORDER — ISOSORBIDE MONONITRATE ER 30 MG PO TB24
30.0000 mg | ORAL_TABLET | Freq: Every day | ORAL | Status: DC
  Administered 2019-03-31 – 2019-04-04 (×5): 30 mg via ORAL
  Filled 2019-03-31 (×5): qty 1

## 2019-03-31 MED ORDER — IOHEXOL 300 MG/ML  SOLN
100.0000 mL | Freq: Once | INTRAMUSCULAR | Status: AC | PRN
  Administered 2019-03-31: 18:00:00 100 mL via INTRAVENOUS

## 2019-03-31 MED ORDER — SERTRALINE HCL 100 MG PO TABS
200.0000 mg | ORAL_TABLET | Freq: Every day | ORAL | Status: DC
  Administered 2019-04-01 – 2019-04-04 (×4): 200 mg via ORAL
  Filled 2019-03-31 (×4): qty 2

## 2019-03-31 NOTE — ED Triage Notes (Signed)
Patient presents ambulatory stating the [facility] sent him here due to him being jaundice and having blood in his urine. Patient states his urine has been dark for about 30 days and just thought he was dehydrated. C/o dull lower abdominal pain.

## 2019-03-31 NOTE — ED Provider Notes (Signed)
  Physical Exam  BP (!) 158/86   Pulse (!) 48   Temp 97.7 F (36.5 C) (Oral)   Resp 20   SpO2 100%   Physical Exam  ED Course/Procedures   Clinical Course as of Mar 31 2015  Tue Mar 31, 2019  1524 Alkaline Phosphatase(!): 1,166 [CG]  1524 Total Bilirubin(!): 14.1 [CG]  1524 ALT(!): 324 [CG]  1524 AST(!): 411 [CG]  1524 Albumin(!): 3.0 [CG]  1524 Lipase(!): 65 [CG]  1524 Bilirubin Urine(!): MODERATE [CG]  1813 IMPRESSION: 1. 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 At this time, this appears to be obstructing the common bile duct which is dilated measuring 17 mm in the porta hepatis. There is also mild intrahepatic biliary ductal dilatation. 2. 1.5 x 1.1 cm nodule in the medial limb of the left adrenal gland, indeterminate. The possibility of a metastatic lesion should be considered, and close attention on any future follow-up imaging is recommended to ensure stability. 3. Cholelithiasis without evidence of acute cholecystitis at this time. 4. Aortic atherosclerosis. Status post median sternotomy for CABG. 5. Additional incidental findings, as above.  CT ABDOMEN PELVIS W CONTRAST [CG]  1836 Patient updated on CT results. GI consult pending    [CG]  1900 Dr. Linda Hedges hospitalist to consult on this patient. GI consult pending   [KM]  2015 Spoke with Dr. Penelope Coop from GI who will consult on this patient tomorrow. They report nothing emergent to be done, they will see him in the AM.   [KM]    Clinical Course User Index [CG] [name], PA-C [KM] [name], PA-C    Procedures  MDM  Patient care assumed from [place], [place] due to change of shift.  See her note for full HPI.  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  Spoke with GI who stated no need for any emergent interventions at this time but GI will come and consult on the patient tomorrow.      Isaiah Patterson 03/31/19 2017    Isaiah Patterson Isaiah Overland, MD 03/31/19 2127

## 2019-03-31 NOTE — ED Notes (Signed)
Sent down a dark green on ice

## 2019-03-31 NOTE — ED Provider Notes (Addendum)
[facility] EMERGENCY DEPARTMENT Provider Note   CSN: [id number] Arrival date & time: 03/31/19  1313     History   Chief Complaint Chief Complaint  Patient presents with  . Abdominal Pain    HPI Isaiah Patterson is a 73 y.o. male with pertinent past medical history of acid reflux, CAD s/p CABG and MI presents to the ER for evaluation of orange urine and yellow discoloration to his skin.  Patient had appointment at the [facility] and had blood work done, they called him and told him his labs were abnormal and to go to the ER.  Reports [place] history of "acid reflux" for most a year describes it as abdominal discomfort that has become constant but usually worsens after meals associated with increased belching, bloating, regurgitation, nausea.  Several weeks ago his stool was tan-colored.  He has had to change his diet and now mostly eating soups because anything else will exacerbate his acid reflux symptoms.  He has been compliant with famotidine and Protonix without any relief.  Currently reports mild generalized abdominal "soreness" and discomfort.  Reports 7 pound weight loss but states he has been trying by watching his diet.  No history of abdominal surgeries.  He denies fever, vomiting, chest pain.  No dysuria, urinary urgency or frequency, flank pain.  Denies alcohol use or frequent Tylenol use.  3 to 4 weeks ago he injured his back and took ibuprofen once daily for about a week but has since stopped.  No history of ulcers.     HPI  Past Medical History:  Diagnosis Date  . Arthritis    "hands, fingers" (02/03/2013)  . Blood dyscrasia    "told I was a free bleeder when I was young; my blood does have trouble clotting" (02/03/2013)  . Coronary artery disease    s/p CABG  . GERD (gastroesophageal reflux disease)   . Hyperlipidemia   . Hypertension   . Myocardial infarct ([place]) 2000  . Pneumonia    "couple times" (02/03/2013)    Patient Active Problem List   Diagnosis Date  Noted  . Obese 02/27/2013  . Unstable angina ([place]) 02/03/2013  . CAD (coronary artery disease) 02/03/2013  . HTN (hypertension) 02/03/2013  . Dyslipidemia 02/03/2013    Past Surgical History:  Procedure Laterality Date  . CORONARY ARTERY BYPASS GRAFT  2000   "CABG X5" (02/03/2013)  . LEFT HEART CATHETERIZATION WITH CORONARY/GRAFT ANGIOGRAM N/A 02/04/2013   Procedure: LEFT HEART CATHETERIZATION WITH Beatrix Fetters;  Surgeon: Candee Furbish, MD;  Location: [facility] CATH LAB;  Service: Cardiovascular;  Laterality: N/A;  . TONSILLECTOMY  1950's        Home Medications    Prior to Admission medications   Medication Sig Start Date End Date Taking? Authorizing Provider  aspirin EC 81 MG tablet Take 1 tablet (81 mg total) by mouth daily. 02/04/13  Yes Jerline Pain, MD  atorvastatin (LIPITOR) 80 MG tablet Take 80 mg by mouth daily.   Yes [provider]  ezetimibe-simvastatin (VYTORIN) 10-40 MG per tablet Take 1 tablet by mouth at bedtime.   Yes [provider]  famotidine (PEPCID) 20 MG tablet Take 20 mg by mouth 2 (two) times daily.   Yes [provider]  ferrous gluconate (FERGON) 324 MG tablet Take 324 mg by mouth daily with breakfast.   Yes [provider]  isosorbide mononitrate (IMDUR) 30 MG 24 hr tablet Take 1 tablet (30 mg total) by mouth daily. 02/04/13  Yes  Jerline Pain, MD  lisinopril (PRINIVIL,ZESTRIL) 20 MG tablet Take 20 mg by mouth daily.  11/17/12  Yes [provider]  Multiple Vitamin (MULTIVITAMIN WITH MINERALS) TABS tablet Take 1 tablet by mouth daily.   Yes [provider]  nicotine (NICODERM CQ - DOSED IN MG/24 HOURS) 21 mg/24hr patch Place 21 mg onto the skin daily as needed (smoking cession).    Yes [provider]  nitroGLYCERIN (NITROSTAT) 0.4 MG SL tablet Place 1 tablet (0.4 mg total) under the tongue every 5 (five) minutes as needed for chest pain. 02/04/13  Yes Jerline Pain, MD  omeprazole (PRILOSEC)  40 MG capsule Take 40 mg by mouth daily. 11/22/12  Yes [provider]  pantoprazole (PROTONIX) 40 MG tablet Take 40 mg by mouth daily.   Yes [provider]  sertraline (ZOLOFT) 100 MG tablet Take 200 mg by mouth daily.   Yes [provider]    Family History Family History  Problem Relation Age of Onset  . Heart disease Mother   . Heart disease Father     Social History Social History   Tobacco Use  . Smoking status: Former Smoker    Packs/day: 1.00    Years: 25.00    Pack years: 25.00    Types: Cigarettes  . Smokeless tobacco: Never Used  . Tobacco comment: 02/03/2013 "quit smoking ~ 25 yr ago"  Substance Use Topics  . Alcohol use: Not Currently    Comment: 02/03/2013 "stopped drinking alcohol in the 1990's; never had problem w/it"  . Drug use: No     Allergies   Patient has no known allergies.   Review of Systems Review of Systems  Gastrointestinal: Positive for abdominal pain and nausea.       Increased belching, regurgitation  Genitourinary:       Darker urine   All other systems reviewed and are negative.    Physical Exam Updated Vital Signs BP (!) 161/82   Pulse (!) 39   Temp 97.7 F (36.5 C) (Oral)   Resp 16   SpO2 99%   Physical Exam Vitals signs and nursing note reviewed.  Constitutional:      Appearance: He is well-developed.     Comments: Non toxic.  HENT:     Head: Normocephalic and atraumatic.     Nose: Nose normal.  Eyes:     Conjunctiva/sclera: Conjunctivae normal.  Neck:     Musculoskeletal: Normal range of motion.  Cardiovascular:     Rate and Rhythm: Normal rate and regular rhythm.     Heart sounds: Normal heart sounds.  Pulmonary:     Effort: Pulmonary effort is normal.     Breath sounds: Normal breath sounds.  Chest:     Comments: Large sternotomy scar noted Abdominal:     General: Bowel sounds are normal.     Palpations: Abdomen is soft.     Tenderness: There is no abdominal tenderness.      Comments: Protuberant but soft abdomen, soft. No distention or fluid wave. No G/R/R. No suprapubic or CVA tenderness. Negative Murphy's and McBurney's  Musculoskeletal: Normal range of motion.  Skin:    General: Skin is warm and dry.     Capillary Refill: Capillary refill takes less than 2 seconds.     Comments: Jaundiced skin   Neurological:     Mental Status: He is alert.  Psychiatric:        Behavior: Behavior normal.      ED Treatments /  Results  Labs (all labs ordered are listed, but only abnormal results are displayed) Labs Reviewed  LIPASE, BLOOD - Abnormal; Notable for the following components:      Result Value   Lipase 65 (*)    All other components within normal limits  COMPREHENSIVE METABOLIC PANEL - Abnormal; Notable for the following components:   Glucose, Bld 117 (*)    Albumin 3.0 (*)    AST 411 (*)    ALT 324 (*)    Alkaline Phosphatase 1,166 (*)    Total Bilirubin 14.1 (*)    All other components within normal limits  CBC - Abnormal; Notable for the following components:   RBC 4.12 (*)    HCT 38.8 (*)    RDW 16.6 (*)    All other components within normal limits  URINALYSIS, ROUTINE W REFLEX MICROSCOPIC - Abnormal; Notable for the following components:   Color, Urine AMBER (*)    Hgb urine dipstick SMALL (*)    Bilirubin Urine MODERATE (*)    All other components within normal limits  ACETAMINOPHEN LEVEL - Abnormal; Notable for the following components:   Acetaminophen (Tylenol), Serum <10 (*)    All other components within normal limits  HEPATITIS PANEL, ACUTE  HIV ANTIBODY (ROUTINE TESTING W REFLEX)  PROTIME-INR    EKG None  Radiology Ct Abdomen Pelvis W Contrast  Result Date: 03/31/2019 CLINICAL DATA:  73 year old male with history of elevated liver function tests. Hyperbilirubinemia. Jaundice. Unintentional weight loss. EXAM: CT ABDOMEN AND PELVIS WITH CONTRAST TECHNIQUE: Multidetector CT imaging of the abdomen and pelvis was performed using  the standard protocol following bolus administration of intravenous contrast. CONTRAST:  13mL OMNIPAQUE IOHEXOL 300 MG/ML  SOLN COMPARISON:  No priors. FINDINGS: Lower chest: Mild scarring in the lung bases bilaterally. Aortic atherosclerosis. Status post median sternotomy for CABG. Hepatobiliary: Subcentimeter low-attenuation lesion in segment 5 of the liver, too small to characterize, but statistically likely to represent a cyst. No other suspicious hepatic lesions. Mild intra hepatic biliary ductal dilatation. Common bile duct is also markedly dilated measuring 17 mm in the porta hepatis. Common bile duct abruptly terminates in the region of the head of the pancreas where there is a poorly defined slightly hyperenhancing area estimated to measure approximately 2.8 x 1.5 cm (axial image 34 of series 3). Small calcified gallstones lying dependently in the gallbladder. No findings to suggest an acute cholecystitis at this time. Pancreas: Subtle hyperenhancing lesion in the superior aspect of the pancreatic head (axial image 34 of series 3) measuring up to 2.8 x 1.5 cm. This mass is well separated from the superior mesenteric artery and vein, splenic vein, splenoportal confluence and portal vein. Body and tail of the pancreas are normal in appearance. No pancreatic ductal dilatation. No peripancreatic fluid collections or inflammatory changes. Spleen: Unremarkable. Adrenals/Urinary Tract: 1.5 x 1.1 cm nodule in the medial limb of the left adrenal gland (axial image 21 of series 3), indeterminate. Bilateral kidneys and the right adrenal gland are normal in appearance. No hydroureteronephrosis. Urinary bladder is normal in appearance. Stomach/Bowel: Normal appearance of the stomach. No pathologic dilatation of small bowel or colon. Normal appendix. Vascular/Lymphatic: Aortic atherosclerosis, without evidence of aneurysm or dissection in the abdominal or pelvic vasculature. Vascular findings relevant to the potential  pancreatic neoplasm, as detailed above. No lymphadenopathy noted in the abdomen or pelvis. Reproductive: Prostate gland and seminal vesicles are unremarkable in appearance. Other: No significant volume of ascites.  No pneumoperitoneum. Musculoskeletal: There are no aggressive  appearing lytic or blastic lesions noted in the visualized portions of the skeleton. T12 compression fracture with 40% loss of anterior vertebral body height which appears chronic. IMPRESSION: 1. 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 At this time, this appears to be obstructing the common bile duct which is dilated measuring 17 mm in the porta hepatis. There is also mild intrahepatic biliary ductal dilatation. 2. 1.5 x 1.1 cm nodule in the medial limb of the left adrenal gland, indeterminate. The possibility of a metastatic lesion should be considered, and close attention on any future follow-up imaging is recommended to ensure stability. 3. Cholelithiasis without evidence of acute cholecystitis at this time. 4. Aortic atherosclerosis.  Status post median sternotomy for CABG. 5. Additional incidental findings, as above. Electronically Signed   By: Vinnie Langton M.D.   On: 03/31/2019 18:04    Procedures Procedures (including critical care time)  Medications Ordered in ED Medications  iohexol (OMNIPAQUE) 300 MG/ML solution 100 mL (100 mLs Intravenous Contrast Given 03/31/19 1752)     Initial Impression / Assessment and Plan / ED Course  I have reviewed the triage vital signs and the nursing notes.  Pertinent labs & imaging results that were available during my care of the patient were reviewed by me and considered in my medical decision making (see chart for details).  Clinical Course as of Mar 31 1726  Tue Mar 31, 2019  1524 Alkaline Phosphatase(!): 1,166 [CG]   1524 Total Bilirubin(!): 14.1 [CG]  1524 ALT(!): 324 [CG]  1524 AST(!): 411 [CG]  1524 Albumin(!): 3.0 [CG]  1524 Lipase(!): 65 [CG]  1524 Bilirubin Urine(!): MODERATE [CG]  1813 IMPRESSION: 1. 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 At this time, this appears to be obstructing the common bile duct which is dilated measuring 17 mm in the porta hepatis. There is also mild intrahepatic biliary ductal dilatation. 2. 1.5 x 1.1 cm nodule in the medial limb of the left adrenal gland, indeterminate. The possibility of a metastatic lesion should be considered, and close attention on any future follow-up imaging is recommended to ensure stability. 3. Cholelithiasis without evidence of acute cholecystitis at this time. 4. Aortic atherosclerosis. Status post median sternotomy for CABG. 5. Additional incidental findings, as above.  CT ABDOMEN PELVIS W CONTRAST [CG]  1836 Patient updated on CT results. GI consult pending    [CG]  1900 Dr. Linda Hedges hospitalist to consult on this patient. GI consult pending   [KM]  2015 Spoke with Dr. Penelope Coop from GI who will consult on this patient tomorrow. They report nothing emergent to be done, they will see him in the AM.   [KM]    Clinical Course User Index [CG] [name], PA-C [KM] [name], PA-C   EMR reviewed to assist with MDM.  ER work-up initiated in triage reviewed and interpreted personally.  LFTs are abnormal as above.  Total bilirubin is 14.1.  There is bilirubin in the urine as well.  Lipase is slightly elevated.  Concern for hepatobiliary/pancreatic process.  His symptoms have been gradual for months.  No acute changes, fever, right upper quadrant tenderness.  Denies any medications or recreational drug use.  Denies EtOH use.  No hepatitis risk factors or symptoms to suggest  this.  No recent abdominal surgeries.  No known liver or hemolytic disorders.  Concern for malignancy given his age and chronicity of symptoms.  Will add Tylenol, HIV, acute hepatitis, PT/INR and obtain CT A/P.  1845: CTAP shows pancreatic lesion concerning for primary neoplasm resulting in CBD dilation 75mm, left adrenal gland nodule.  Discussed findings with patient.  Re-evaluated and no clinical decline.  GI and medicine consults pending.    1907: Spoke to Dr Linda Hedges and I recommended admission for further work up, GI evaluation.  Dr Linda Hedges will see patient.  GI consult pending.  Final Clinical Impressions(s) / ED Diagnoses   Final diagnoses:  Pancreatic lesion  Hyperbilirubinemia    ED Discharge Orders    None       [name], PA-C 03/31/19 1908    [name], PA-C 04/01/19 1727    Veryl Speak, MD 04/02/19 [phone]

## 2019-03-31 NOTE — ED Notes (Signed)
ED TO INPATIENT HANDOFF REPORT  ED Nurse Name and Phone #: Lorrin Goodell [phone]  S Name/Age/Gender Bradley Ferris Klingel 73 y.o. male Room/Bed: [id number]  Code Status   Code Status: Full Code  Home/SNF/Other Home Patient oriented to: self, place, time and situation Is this baseline? Yes   Triage Complete: Triage complete  Chief Complaint jaudice  Triage Note Patient presents ambulatory stating the [facility] sent him here due to him being jaundice and having blood in his urine. Patient states his urine has been dark for about 30 days and just thought he was dehydrated. C/o dull lower abdominal pain.   Allergies No Known Allergies  Level of Care/Admitting Diagnosis ED Disposition    ED Disposition Condition [facility] Area: [facility] [[id number]]  Level of Care: Med-Surg [16]  Covid Evaluation: Asymptomatic Screening Protocol (No Symptoms)  Diagnosis: Painless jaundice [id number]  Admitting Physician: Neena Rhymes [[id number]]  Attending Physician: Adella Hare E [[id number]]  Estimated length of stay: past midnight tomorrow  Certification:: I certify this patient will need inpatient services for at least 2 midnights  PT Class (Do Not Modify): Inpatient [101]  PT Acc Code (Do Not Modify): Private [1]       B Medical/Surgery History Past Medical History:  Diagnosis Date  . Arthritis    "hands, fingers" (02/03/2013)  . Blood dyscrasia    "told I was a free bleeder when I was young; my blood does have trouble clotting" (02/03/2013)  . Coronary artery disease    s/p CABG  . GERD (gastroesophageal reflux disease)   . Hyperlipidemia   . Hypertension   . Myocardial infarct ([place]) 2000  . Pneumonia    "couple times" (02/03/2013)   Past Surgical History:  Procedure Laterality Date  . CORONARY ARTERY BYPASS GRAFT  2000   "CABG X5" (02/03/2013)  . LEFT HEART CATHETERIZATION WITH CORONARY/GRAFT ANGIOGRAM N/A 02/04/2013   Procedure: LEFT HEART CATHETERIZATION WITH  Beatrix Fetters;  Surgeon: Candee Furbish, MD;  Location[facility] CATH LAB;  Service: Cardiovascular;  Laterality: N/A;  . TONSILLECTOMY  1950's     A IV Location/Drains/Wounds Patient Lines/Drains/Airways Status   Active Line/Drains/Airways    Name:   Placement date:   Placement time:   Site:   Days:   Peripheral IV 03/31/19 Left Antecubital   03/31/19    1540    Antecubital   less than 1          Intake/Output Last 24 hours No intake or output data in the 24 hours ending 03/31/19 2225  Labs/Imaging Results for orders placed or performed during the hospital encounter of 03/31/19 (from the past 48 hour(s))  Lipase, blood     Status: Abnormal   Collection Time: 03/31/19  1:32 PM  Result Value Ref Range   Lipase 65 (H) 11 - 51 U/L    Comment: Performed at [facility] [address] [place], [place] [address]  Comprehensive metabolic panel     Status: Abnormal   Collection Time: 03/31/19  1:32 PM  Result Value Ref Range   Sodium 139 135 - 145 mmol/L   Potassium 4.3 3.5 - 5.1 mmol/L   Chloride 104 98 - 111 mmol/L   CO2 25 22 - 32 mmol/L   Glucose, Bld 117 (H) 70 - 99 mg/dL   BUN 8 8 - 23 mg/dL   Creatinine, Ser 0.90 0.61 - 1.24 mg/dL   Calcium 9.2 8.9 - 10.3 mg/dL   Total Protein  6.9 6.5 - 8.1 g/dL   Albumin 3.0 (L) 3.5 - 5.0 g/dL   AST 411 (H) 15 - 41 U/L   ALT 324 (H) 0 - 44 U/L   Alkaline Phosphatase 1,166 (H) 38 - 126 U/L   Total Bilirubin 14.1 (H) 0.3 - 1.2 mg/dL   GFR calc non Af Amer >60 >60 mL/min   GFR calc Af Amer >60 >60 mL/min   Anion gap 10 5 - 15    Comment: Performed at [facility] [address], [place], [place] [address]  CBC     Status: Abnormal   Collection Time: 03/31/19  1:32 PM  Result Value Ref Range   WBC 7.4 4.0 - 10.5 K/uL   RBC 4.12 (L) 4.22 - 5.81 MIL/uL   Hemoglobin 13.2 13.0 - 17.0 g/dL   HCT 38.8 (L) 39.0 - 52.0 %   MCV 94.2 80.0 - 100.0 fL   MCH 32.0 26.0 - 34.0 pg   MCHC 34.0 30.0 - 36.0 g/dL   RDW 16.6 (H) 11.5 -  15.5 %   Platelets 191 150 - 400 K/uL    Comment: REPEATED TO VERIFY   nRBC 0.0 0.0 - 0.2 %    Comment: Performed at [facility], [place] [address], [place], [place] [address]  Urinalysis, Routine w reflex microscopic     Status: Abnormal   Collection Time: 03/31/19  1:33 PM  Result Value Ref Range   Color, Urine AMBER (A) YELLOW    Comment: BIOCHEMICALS MAY BE AFFECTED BY COLOR   APPearance CLEAR CLEAR   Specific Gravity, Urine 1.012 1.005 - 1.030   pH 5.0 5.0 - 8.0   Glucose, UA NEGATIVE NEGATIVE mg/dL   Hgb urine dipstick SMALL (A) NEGATIVE   Bilirubin Urine MODERATE (A) NEGATIVE   Ketones, ur NEGATIVE NEGATIVE mg/dL   Protein, ur NEGATIVE NEGATIVE mg/dL   Nitrite NEGATIVE NEGATIVE   Leukocytes,Ua NEGATIVE NEGATIVE   RBC / HPF 0-5 0 - 5 RBC/hpf   WBC, UA 0-5 0 - 5 WBC/hpf   Bacteria, UA NONE SEEN NONE SEEN   Squamous Epithelial / LPF 0-5 0 - 5    Comment: Performed at [facility], [place] [address], [place], [place] [address]  Hepatitis panel, acute     Status: None   Collection Time: 03/31/19  3:28 PM  Result Value Ref Range   Hepatitis B Surface Ag NON REACTIVE NON REACTIVE   HCV Ab NON REACTIVE NON REACTIVE    Comment: (NOTE) Nonreactive HCV antibody screen is consistent with no HCV infections,  unless recent infection is suspected or other evidence exists to indicate HCV infection.    Hep A IgM NON REACTIVE NON REACTIVE   Hep B C IgM NON REACTIVE NON REACTIVE    Comment: Performed at [facility], [place] [address], [place], [place] [address]  HIV Antibody (routine testing w rflx)     Status: None   Collection Time: 03/31/19  3:28 PM  Result Value Ref Range   HIV Screen 4th Generation wRfx NON REACTIVE NON REACTIVE    Comment: Performed at [facility], [place] [address], [place], [place] [address]  Acetaminophen level     Status: Abnormal   Collection Time: 03/31/19  3:29 PM  Result Value Ref Range   Acetaminophen (Tylenol), Serum <10 (L) 10 - 30  ug/mL    Comment: (NOTE) Therapeutic concentrations vary significantly. A range of 10-30 ug/mL  may be  an effective concentration for many patients. However, some  are best treated at concentrations outside of this range. Acetaminophen concentrations >150 ug/mL at 4 hours after ingestion  and >50 ug/mL at 12 hours after ingestion are often associated with  toxic reactions. Performed at [facility], [place] [address] [place], [place] [address]   Protime-INR     Status: None   Collection Time: 03/31/19  4:30 PM  Result Value Ref Range   Prothrombin Time 13.7 11.4 - 15.2 seconds   INR 1.1 0.8 - 1.2    Comment: (NOTE) INR goal varies based on device and disease states. Performed at [facility], [place] [address] [place], [place] [address]    Ct Abdomen Pelvis W Contrast  Result Date: 03/31/2019 CLINICAL DATA:  73 year old male with history of elevated liver function tests. Hyperbilirubinemia. Jaundice. Unintentional weight loss. EXAM: CT ABDOMEN AND PELVIS WITH CONTRAST TECHNIQUE: Multidetector CT imaging of the abdomen and pelvis was performed using the standard protocol following bolus administration of intravenous contrast. CONTRAST:  164mL OMNIPAQUE IOHEXOL 300 MG/ML  SOLN COMPARISON:  No priors. FINDINGS: Lower chest: Mild scarring in the lung bases bilaterally. Aortic atherosclerosis. Status post median sternotomy for CABG. Hepatobiliary: Subcentimeter low-attenuation lesion in segment 5 of the liver, too small to characterize, but statistically likely to represent a cyst. No other suspicious hepatic lesions. Mild intra hepatic biliary ductal dilatation. Common bile duct is also markedly dilated measuring 17 mm in the porta hepatis. Common bile duct abruptly terminates in the region of the head of the pancreas where there is a poorly defined slightly hyperenhancing area estimated to measure approximately 2.8 x 1.5 cm (axial image 34 of series 3). Small calcified gallstones lying  dependently in the gallbladder. No findings to suggest an acute cholecystitis at this time. Pancreas: Subtle hyperenhancing lesion in the superior aspect of the pancreatic head (axial image 34 of series 3) measuring up to 2.8 x 1.5 cm. This mass is well separated from the superior mesenteric artery and vein, splenic vein, splenoportal confluence and portal vein. Body and tail of the pancreas are normal in appearance. No pancreatic ductal dilatation. No peripancreatic fluid collections or inflammatory changes. Spleen: Unremarkable. Adrenals/Urinary Tract: 1.5 x 1.1 cm nodule in the medial limb of the left adrenal gland (axial image 21 of series 3), indeterminate. Bilateral kidneys and the right adrenal gland are normal in appearance. No hydroureteronephrosis. Urinary bladder is normal in appearance. Stomach/Bowel: Normal appearance of the stomach. No pathologic dilatation of small bowel or colon. Normal appendix. Vascular/Lymphatic: Aortic atherosclerosis, without evidence of aneurysm or dissection in the abdominal or pelvic vasculature. Vascular findings relevant to the potential pancreatic neoplasm, as detailed above. No lymphadenopathy noted in the abdomen or pelvis. Reproductive: Prostate gland and seminal vesicles are unremarkable in appearance. Other: No significant volume of ascites.  No pneumoperitoneum. Musculoskeletal: There are no aggressive appearing lytic or blastic lesions noted in the visualized portions of the skeleton. T12 compression fracture with 40% loss of anterior vertebral body height which appears chronic. IMPRESSION: 1. 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 At this time, this appears to be obstructing the common bile duct which is dilated measuring 17 mm in the porta hepatis. There is also mild intrahepatic biliary ductal  dilatation. 2. 1.5 x 1.1 cm nodule in the medial limb of the left adrenal gland, indeterminate. The possibility of  a metastatic lesion should be considered, and close attention on any future follow-up imaging is recommended to ensure stability. 3. Cholelithiasis without evidence of acute cholecystitis at this time. 4. Aortic atherosclerosis.  Status post median sternotomy for CABG. 5. Additional incidental findings, as above. Electronically Signed   By: Vinnie Langton M.D.   On: 03/31/2019 18:04    Pending Labs Unresulted Labs (From admission, onward)    Start     Ordered   04/07/19 0500  Creatinine, serum  (enoxaparin (LOVENOX)    CrCl >/= 30 ml/min)  Weekly,   R    Comments: while on enoxaparin therapy    03/31/19 1919   03/31/19 2219  SARS CORONAVIRUS 2 (TAT 6-24 HRS) Nasopharyngeal Nasopharyngeal Swab  (Asymptomatic/Tier 3)  Once,   STAT    Question Answer Comment  Is this test for diagnosis or screening Screening   Symptomatic for COVID-19 as defined by CDC No   Hospitalized for COVID-19 No   Admitted to ICU for COVID-19 No   Previously tested for COVID-19 No   Resident in a congregate (group) care setting No   Employed in healthcare setting No      03/31/19 2218          Vitals/Pain Today's Vitals   03/31/19 1930 03/31/19 2000 03/31/19 2157 03/31/19 2200  BP: (!) 158/86  (!) 151/76 138/73  Pulse:  (!) 48    Resp: 16 20 16 20   Temp:      TempSrc:      SpO2:  100%    PainSc:        Isolation Precautions No active isolations  Medications Medications  atorvastatin (LIPITOR) tablet 80 mg (80 mg Oral Given 03/31/19 2049)  ezetimibe-simvastatin (VYTORIN) 10-40 MG per tablet 1 tablet (has no administration in time range)  isosorbide mononitrate (IMDUR) 24 hr tablet 30 mg (30 mg Oral Given 03/31/19 2156)  lisinopril (ZESTRIL) tablet 20 mg (has no administration in time range)  nitroGLYCERIN (NITROSTAT) SL tablet 0.4 mg (has no administration in time range)  nicotine  (NICODERM CQ - dosed in mg/24 hours) patch 21 mg (has no administration in time range)  sertraline (ZOLOFT) tablet 200 mg (has no administration in time range)  pantoprazole (PROTONIX) EC tablet 40 mg (has no administration in time range)  enoxaparin (LOVENOX) injection 40 mg (has no administration in time range)  dextrose 5 %-0.45 % sodium chloride infusion ( Intravenous New Bag/Given 03/31/19 2051)  traMADol (ULTRAM) tablet 50 mg (has no administration in time range)  ketorolac (TORADOL) 15 MG/ML injection 15 mg (has no administration in time range)  senna (SENOKOT) tablet 8.6 mg (8.6 mg Oral Refused 03/31/19 2156)  iohexol (OMNIPAQUE) 300 MG/ML solution 100 mL (100 mLs Intravenous Contrast Given 03/31/19 1752)    Mobility walks Low fall risk   Focused Assessments    R Recommendations: See Admitting Provider Note  Report given to:   Additional Notes:

## 2019-03-31 NOTE — ED Notes (Signed)
Report given to Bryon Lions on 5c

## 2019-03-31 NOTE — H&P (Signed)
History and Physical    Isaiah Patterson VFI:[id number] DOB: Oct 16, 1945 DOA: 03/31/2019  PCP: Lennie Odor, PA-C (Confirm with patient/family/NH records and if not entered, this has to be entered at [facility] point of entry) Patient coming from:  Coming from home  I have personally briefly reviewed patient's old medical records in [place]  Chief Complaint: Painless Jaundice  HPI: Isaiah Patterson is a 73 y.o. male with medical history significant of  acid reflux, CAD s/p CABG and MI presents to the ER for evaluation of orange urine and diffuse painless jaundice.  He reports that he has not been feeling well for there last month Patient was seen at [facility] clinic 11/30 and had blood work done. They called him and told him his labs were abnormal and to go to the ER.  Reports Neddo history of "acid reflux" for most a year describes it as abdominal discomfort that has become constant but usually worsens after meals associated with increased belching, bloating, regurgitation, nausea.  Several weeks ago his stool was tan-colored.  He has had to change his diet and now mostly eating soups because anything else will exacerbate his acid reflux symptoms.  He has been compliant with famotidine and Protonix without any relief.  He does endorse moderate epigastric pain with radiation to mid-back.  Reports 7 pound weight loss but states he has been trying by watching his diet.  No history of abdominal surgeries.  He denies fever, vomiting, chest pain.  No dysuria, urinary urgency or frequency, flank pain.  Denies alcohol use or frequent Tylenol use.  No history of ulcers.   ED Course: Hemodynamically stable in the ED. He is jaundiced. Labs revealed elevated transminases: AST 411, ALT 324, Alk Phos 1,166, lipase 65. CT reveals hepatic cyst, dilated CBD at porta hepatits 17 mm, 2.8 x 1.5 hyperintensifying lesion head of pancrease w/ sudden cut off of pancreatic duct, w/o vascular involvement, w/o adenopathy.  He is referred to [facility] for admission for further GI work-up.  Review of Systems: As per HPI otherwise 10 point review of systems negative.    Past Medical History:  Diagnosis Date  . Arthritis    "hands, fingers" (02/03/2013)  . Blood dyscrasia    "told I was a free bleeder when I was young; my blood does have trouble clotting" (02/03/2013)  . Coronary artery disease    s/p CABG  . GERD (gastroesophageal reflux disease)   . Hyperlipidemia   . Hypertension   . Myocardial infarct ([place]) 2000  . Pneumonia    "couple times" (02/03/2013)    Past Surgical History:  Procedure Laterality Date  . CORONARY ARTERY BYPASS GRAFT  2000   "CABG X5" (02/03/2013)  . LEFT HEART CATHETERIZATION WITH CORONARY/GRAFT ANGIOGRAM N/A 02/04/2013   Procedure: LEFT HEART CATHETERIZATION WITH Beatrix Fetters;  Surgeon: Candee Furbish, MD;  Location: [facility] CATH LAB;  Service: Cardiovascular;  Laterality: N/A;  . 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 He then worked for [employer] until retirement. He was married then divorced. He has two daughters and six grandchildren. He lives alone but is engaged.   reports that he has quit smoking. His smoking use included cigarettes. He has a 25.00 pack-year smoking history. He has never used smokeless tobacco. He reports previous alcohol use. He reports that he does not use drugs.  No Known  Allergies  Family History  Problem Relation Age of Onset  . Heart disease Mother   . Heart disease Father    Unacceptable: Noncontributory, unremarkable, or negative. Acceptable: Family history reviewed and not pertinent (If you reviewed it)  Prior to Admission medications   Medication Sig Start Date End Date Taking? Authorizing Provider  aspirin EC 81 MG tablet Take 1 tablet (81 mg total) by mouth daily. 02/04/13  Yes Jerline Pain, MD  atorvastatin (LIPITOR) 80 MG tablet  Take 80 mg by mouth daily.   Yes [provider]  ezetimibe-simvastatin (VYTORIN) 10-40 MG per tablet Take 1 tablet by mouth at bedtime.   Yes [provider]  famotidine (PEPCID) 20 MG tablet Take 20 mg by mouth 2 (two) times daily.   Yes [provider]  ferrous gluconate (FERGON) 324 MG tablet Take 324 mg by mouth daily with breakfast.   Yes [provider]  isosorbide mononitrate (IMDUR) 30 MG 24 hr tablet Take 1 tablet (30 mg total) by mouth daily. 02/04/13  Yes Jerline Pain, MD  lisinopril (PRINIVIL,ZESTRIL) 20 MG tablet Take 20 mg by mouth daily.  11/17/12  Yes [provider]  Multiple Vitamin (MULTIVITAMIN WITH MINERALS) TABS tablet Take 1 tablet by mouth daily.   Yes [provider]  nicotine (NICODERM CQ - DOSED IN MG/24 HOURS) 21 mg/24hr patch Place 21 mg onto the skin daily as needed (smoking cession).    Yes [provider]  nitroGLYCERIN (NITROSTAT) 0.4 MG SL tablet Place 1 tablet (0.4 mg total) under the tongue every 5 (five) minutes as needed for chest pain. 02/04/13  Yes Jerline Pain, MD  omeprazole (PRILOSEC) 40 MG capsule Take 40 mg by mouth daily. 11/22/12  Yes [provider]  pantoprazole (PROTONIX) 40 MG tablet Take 40 mg by mouth daily.   Yes [provider]  sertraline (ZOLOFT) 100 MG tablet Take 200 mg by mouth daily.   Yes [provider]    Physical Exam: Vitals:   03/31/19 1700 03/31/19 1909 03/31/19 1930 03/31/19 2000  BP: (!) 161/82 (!) 160/86 (!) 158/86   Pulse:    (!) 48  Resp: 16 (!) _0 Temp:      TempSrc:      SpO2:    100%    Constitutional: NAD, calm, comfortable Vitals:   03/31/19 1700 03/31/19 1909 03/31/19 1930 03/31/19 2000  BP: (!) 161/82 (!) 160/86 (!) 158/86   Pulse:    (!) 48  Resp: 16 (!) _1 Temp:      TempSrc:      SpO2:    100%   General:  WNWD jaundiced man in no distress Eyes: PERRL, lids normal, bulbar and palpebral conjunctiva  icteric. ENMT: Mucous membranes are moist. Posterior pharynx clear of any exudate or lesions.Edentulous with full dentures in place..  Neck: normal, supple, no masses, no thyromegaly Respiratory: clear to auscultation bilaterally, no wheezing, no crackles. Normal respiratory effort. No accessory muscle use.  Cardiovascular: Irregular rhythm, II/VI  systolic murmur best a LSB /no  rubs / gallops. No extremity edema. 2+ pedal pulses. No carotid bruits.  Abdomen: obese, soft, BS+ x 4, no hepatomegaly, tender epigastrum to deep palpation. Musculoskeletal: no clubbing / cyanosis. No joint deformity upper and lower extremities. Good ROM, no contractures. Normal muscle tone.  Skin: no rashes, lesions, ulcers. No induration Neurologic: CN 2-12 grossly intact. Sensation intact, DTR normal. Strength 5/5 in all 4.  Psychiatric: Normal  judgment and insight. Alert and oriented x 3. Normal mood.     Labs on Admission: I have personally reviewed following labs and imaging studies  CBC: Recent Labs  Lab 03/31/19 1332  WBC 7.4  HGB 13.2  HCT 38.8*  MCV 94.2  PLT 676   Basic Metabolic Panel: Recent Labs  Lab 03/31/19 1332  NA 139  K 4.3  CL 104  CO2 25  GLUCOSE 117*  BUN 8  CREATININE 0.90  CALCIUM 9.2   GFR: CrCl cannot be calculated (Unknown ideal weight.). Liver Function Tests: Recent Labs  Lab 03/31/19 1332  AST 411*  ALT 324*  ALKPHOS 1,166*  BILITOT 14.1*  PROT 6.9  ALBUMIN 3.0*   Recent Labs  Lab 03/31/19 1332  LIPASE 65*   No results for input(s): AMMONIA in the last 168 hours. Coagulation Profile: Recent Labs  Lab 03/31/19 1630  INR 1.1   Cardiac Enzymes: No results for input(s): CKTOTAL, CKMB, CKMBINDEX, TROPONINI in the last 168 hours. BNP (last 3 results) No results for input(s): PROBNP in the last 8760 hours. HbA1C: No results for input(s): HGBA1C in the last 72 hours. CBG: No results for input(s): GLUCAP in the last 168 hours. Lipid Profile: No  results for input(s): CHOL, HDL, LDLCALC, TRIG, CHOLHDL, LDLDIRECT in the last 72 hours. Thyroid Function Tests: No results for input(s): TSH, T4TOTAL, FREET4, T3FREE, THYROIDAB in the last 72 hours. Anemia Panel: No results for input(s): VITAMINB12, FOLATE, FERRITIN, TIBC, IRON, RETICCTPCT in the last 72 hours. Urine analysis:    Component Value Date/Time   COLORURINE AMBER (A) 03/31/2019 1333   APPEARANCEUR CLEAR 03/31/2019 1333   LABSPEC 1.012 03/31/2019 1333   PHURINE 5.0 03/31/2019 1333   GLUCOSEU NEGATIVE 03/31/2019 1333   HGBUR SMALL (A) 03/31/2019 1333   BILIRUBINUR MODERATE (A) 03/31/2019 1333   KETONESUR NEGATIVE 03/31/2019 1333   PROTEINUR NEGATIVE 03/31/2019 1333   NITRITE NEGATIVE 03/31/2019 1333   LEUKOCYTESUR NEGATIVE 03/31/2019 1333    Radiological Exams on Admission: Ct Abdomen Pelvis W Contrast  Result Date: 03/31/2019 CLINICAL DATA:  73 year old male with history of elevated liver function tests. Hyperbilirubinemia. Jaundice. Unintentional weight loss. EXAM: CT ABDOMEN AND PELVIS WITH CONTRAST TECHNIQUE: Multidetector CT imaging of the abdomen and pelvis was performed using the standard protocol following bolus administration of intravenous contrast. CONTRAST:  145m OMNIPAQUE IOHEXOL 300 MG/ML  SOLN COMPARISON:  No priors. FINDINGS: Lower chest: Mild scarring in the lung bases bilaterally. Aortic atherosclerosis. Status post median sternotomy for CABG. Hepatobiliary: Subcentimeter low-attenuation lesion in segment 5 of the liver, too small to characterize, but statistically likely to represent a cyst. No other suspicious hepatic lesions. Mild intra hepatic biliary ductal dilatation. Common bile duct is also markedly dilated measuring 17 mm in the porta hepatis. Common bile duct abruptly terminates in the region of the head of the pancreas where there is a poorly defined slightly hyperenhancing area estimated to measure approximately 2.8 x 1.5 cm (axial image 34 of series  3). Small calcified gallstones lying dependently in the gallbladder. No findings to suggest an acute cholecystitis at this time. Pancreas: Subtle hyperenhancing lesion in the superior aspect of the pancreatic head (axial image 34 of series 3) measuring up to 2.8 x 1.5 cm. This mass is well separated from the superior mesenteric artery and vein, splenic vein, splenoportal confluence and portal vein. Body and tail of the pancreas are normal in appearance. No pancreatic ductal dilatation. No peripancreatic fluid collections or inflammatory changes. Spleen: Unremarkable. Adrenals/Urinary Tract:  1.5 x 1.1 cm nodule in the medial limb of the left adrenal gland (axial image 21 of series 3), indeterminate. Bilateral kidneys and the right adrenal gland are normal in appearance. No hydroureteronephrosis. Urinary bladder is normal in appearance. Stomach/Bowel: Normal appearance of the stomach. No pathologic dilatation of small bowel or colon. Normal appendix. Vascular/Lymphatic: Aortic atherosclerosis, without evidence of aneurysm or dissection in the abdominal or pelvic vasculature. Vascular findings relevant to the potential pancreatic neoplasm, as detailed above. No lymphadenopathy noted in the abdomen or pelvis. Reproductive: Prostate gland and seminal vesicles are unremarkable in appearance. Other: No significant volume of ascites.  No pneumoperitoneum. Musculoskeletal: There are no aggressive appearing lytic or blastic lesions noted in the visualized portions of the skeleton. T12 compression fracture with 40% loss of anterior vertebral body height which appears chronic. IMPRESSION: 1. 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 At this time, this appears to be obstructing the common bile duct which is dilated measuring 17 mm in the porta hepatis. There is also mild  intrahepatic biliary ductal dilatation. 2. 1.5 x 1.1 cm nodule in the medial limb of the left adrenal gland, indeterminate. The possibility of a metastatic lesion should be considered, and close attention on any future follow-up imaging is recommended to ensure stability. 3. Cholelithiasis without evidence of acute cholecystitis at this time. 4. Aortic atherosclerosis.  Status post median sternotomy for CABG. 5. Additional incidental findings, as above. Electronically Signed   By: Vinnie Langton M.D.   On: 03/31/2019 18:04    EKG: Independently reviewed. pending  Assessment/Plan Active Problems:   Painless jaundice   Pancreatic lesion   HTN (hypertension)  (please populate well all problems here in Problem List. (For example, if patient is on BP meds at home and you resume or decide to hold them, it is a problem that needs to be her. Same for CAD, COPD, HLD and so on)   1. Pancreatic lesion with painless jaundice - patient with 2.8 x 1.5 lesion head of pancrease which radiology favors for neuroendocrine tumor. No vascular involvement, no adenopathy. Dilated/obstructed CBD with subsequent elevation Transaminases. Normal lipase. Plan Med-surg admit ` GI consult: may need ERCP for tissue diagnosis. ? Candidate for whipple  Pain control: tramadol for moderate pain, ketorolac for severe pain  NPO /p MN  2. HTN - modestly elevated in ED. Plan Continue home medications  3. HDL - continue home meds  4. CV - no chest pain or cardiac complaints. On exam irregular rhythm - PAC, sinus arrythmia vs mobitz. Plan  12 lead EKG ordered  DVT prophylaxis: lovenox (Lovenox/Heparin/SCD's/anticoagulated/None (if comfort care) Code Status: full code (Full/Partial (specify details) Family Communication: spoke with SO cheryl Love - explained working diagnosis and treatment plan (Specify name, relationship. Do not write "discussed with patient". Specify tel # if discussed over the phone) Disposition Plan: home  when medically stable (specify when and where you expect patient to be discharged) Consults called: GI - to be consulted (ED-MD may have called) (with names) Admission status: inpatient (inpatient / obs / tele / medical floor / SDU)   Adella Hare MD [facility] Pager [phone]  If 7PM-7AM, please contact night-coverage [email or web address] Password [email or web address]  03/31/2019, 8:02 PM

## 2019-04-01 DIAGNOSIS — R7401 Elevation of levels of liver transaminase levels: Secondary | ICD-10-CM

## 2019-04-01 DIAGNOSIS — R748 Abnormal levels of other serum enzymes: Secondary | ICD-10-CM

## 2019-04-01 LAB — SARS CORONAVIRUS 2 (TAT 6-24 HRS): SARS Coronavirus 2: NEGATIVE

## 2019-04-01 MED ORDER — ONDANSETRON HCL 4 MG/2ML IJ SOLN
4.0000 mg | Freq: Four times a day (QID) | INTRAMUSCULAR | Status: DC | PRN
  Administered 2019-04-01: 4 mg via INTRAVENOUS
  Filled 2019-04-01: qty 2

## 2019-04-01 MED ORDER — ONDANSETRON HCL 4 MG/2ML IJ SOLN: 4.0000 mg | Freq: Four times a day (QID) | INTRAMUSCULAR | Status: DC

## 2019-04-01 MED ORDER — ENOXAPARIN SODIUM 40 MG/0.4ML ~~LOC~~ SOLN: 40.0000 mg | SUBCUTANEOUS | Status: DC

## 2019-04-01 NOTE — Consult Note (Signed)
Subjective:   HPI  The patient is a 73 year old male who we are asked to see in regards to obstructive jaundice and a mass in the pancreas. He was found to have abnormal liver enzymes and was told to go to the emergency room. He has noticed yellow jaundice recently as well as recent light-colored stools and dark urine. Liver enzymes were reviewed and are elevated. A CT scan of the abdomen was done showing a subtle hyper vascular lesion in the superior aspect of the pancreatic head concerning for primary pancreatic neoplasm or potentially a hypervascular pancreatic metastases. This lesion appears to be obstructing the common bile duct which was dilated up to 17 mm. Gallstones were also noted. A left adrenal nodule also noted.  Review of Systems No chest pain or shortness of breath   Past Medical History:  Diagnosis Date  . Arthritis    "hands, fingers" (02/03/2013)  . Blood dyscrasia    "told I was a free bleeder when I was young; my blood does have trouble clotting" (02/03/2013)  . Coronary artery disease    s/p CABG  . GERD (gastroesophageal reflux disease)   . Hyperlipidemia   . Hypertension   . Myocardial infarct ([place]) 2000  . Pneumonia    "couple times" (02/03/2013)   Past Surgical History:  Procedure Laterality Date  . CORONARY ARTERY BYPASS GRAFT  2000   "CABG X5" (02/03/2013)  . LEFT HEART CATHETERIZATION WITH CORONARY/GRAFT ANGIOGRAM N/A 02/04/2013   Procedure: LEFT HEART CATHETERIZATION WITH Beatrix Fetters;  Surgeon: Candee Furbish, MD;  [facility] CATH LAB;  Service: Cardiovascular;  Laterality: N/A;  . TONSILLECTOMY  1950's   Social History   Socioeconomic History  . Marital status: Divorced    Spouse name: Not on file  . Number of children: Not on file  . Years of education: Not on file  . Highest education level: Not on file  Occupational History  . Not on file  Social Needs  . Financial resource strain: Not on file  . Food insecurity    Worry: Not on file     Inability: Not on file  . Transportation needs    Medical: Not on file    Non-medical: Not on file  Tobacco Use  . Smoking status: Former Smoker    Packs/day: 1.00    Years: 25.00    Pack years: 25.00    Types: Cigarettes  . Smokeless tobacco: Never Used  . Tobacco comment: 02/03/2013 "quit smoking ~ 25 yr ago"  Substance and Sexual Activity  . Alcohol use: Not Currently    Comment: 02/03/2013 "stopped drinking alcohol in the 1990's; never had problem w/it"  . Drug use: No  . Sexual activity: Yes  Lifestyle  . Physical activity    Days per week: Not on file    Minutes per session: Not on file  . Stress: Not on file  Relationships  . Social Herbalist on phone: Not on file    Gets together: Not on file    Attends religious service: Not on file    Active member of club or organization: Not on file    Attends meetings of clubs or organizations: Not on file    Relationship status: Not on file  . Intimate partner violence    Fear of current or ex partner: Not on file    Emotionally abused: Not on file    Physically abused: Not on file    Forced sexual activity: Not on  file  Other Topics Concern  . Not on file  Social History Narrative  . Not on file   family history includes Heart disease in his father and mother.  Current Facility-Administered Medications:  .  dextrose 5 %-0.45 % sodium chloride infusion, , Intravenous, Continuous, Norins, Heinz Knuckles, MD, Last Rate: 50 mL/hr at 03/31/19 2051 .  enoxaparin (LOVENOX) injection 40 mg, 40 mg, Subcutaneous, Q24H, Norins, Heinz Knuckles, MD, 40 mg at 04/01/19 0946 .  isosorbide mononitrate (IMDUR) 24 hr tablet 30 mg, 30 mg, Oral, Daily, Norins, Heinz Knuckles, MD, 30 mg at 04/01/19 1004 .  ketorolac (TORADOL) 15 MG/ML injection 15 mg, 15 mg, Intravenous, Q6H PRN, Norins, Heinz Knuckles, MD .  lisinopril (ZESTRIL) tablet 20 mg, 20 mg, Oral, Daily, Norins, Heinz Knuckles, MD, 20 mg at 04/01/19 1004 .  nicotine (NICODERM CQ - dosed in mg/24  hours) patch 21 mg, 21 mg, Transdermal, Daily PRN, Norins, Heinz Knuckles, MD .  nitroGLYCERIN (NITROSTAT) SL tablet 0.4 mg, 0.4 mg, Sublingual, Q5 min PRN, Norins, Heinz Knuckles, MD .  pantoprazole (PROTONIX) EC tablet 40 mg, 40 mg, Oral, Daily, Norins, Heinz Knuckles, MD, 40 mg at 04/01/19 1005 .  senna (SENOKOT) tablet 8.6 mg, 1 tablet, Oral, BID, Norins, Heinz Knuckles, MD, 8.6 mg at 04/01/19 1005 .  sertraline (ZOLOFT) tablet 200 mg, 200 mg, Oral, Daily, Norins, Heinz Knuckles, MD, 200 mg at 04/01/19 1005 .  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 Paulita Fujita. I explained ERCP and EUS and biopsy in detail to the patient. I explained the risks of anesthesia, bleeding, infection, perforation, and pancreatitis. He understands and consents to the procedure.

## 2019-04-01 NOTE — Progress Notes (Signed)
PROGRESS NOTE    Isaiah Patterson  ZOX:[id number] DOB: [birth year] DOA: 03/31/2019 PCP: Lennie Odor, PA-C   Brief Narrative:  73 year old white male with a past male history notable for worsening reflux, CAD status post CABG and MI who presents to the ER because of painless jaundice.  Patient reports been feeling poorly for approximately a month and was seen at the [facility] clinic in [place] on November 30 and had some lab work done.  His lab work was found to be abnormal with an elevated bilirubin of 14 and he was subsequently sent to the ER for further eval and treatment.   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 -Notable 2.8x1.5 lesion with an obstructive pattern -CT did not identify vascular involvement -Presentation certainly concerning for pancreatic cancer versus endocrine tumor -GI consultation with plan ERCP and biopsy  Hypertension: -Continue home medications titrate as blood pressure tolerates  Hyperbilirubinemia/elevated alk phos/transaminitis -Consistent with obstructive pattern from mass-effect from the lesion -Anticipate this will improve with stent placement -Monitor closely  Hyperlipidemia: -Patient continue with statin controlled on discharge.  CAD: -No anginal symptomatology -Patient continue his home medications without acute change  DVT prophylaxis: Lovenox SQ  Code Status: Full code    Code Status Orders  (From admission, onward)         Start     Ordered   03/31/19 1918  Full code  Continuous     03/31/19 1919        Code Status History    This patient has a current code status but no historical code status.   Advance Care Planning Activity     Family Communication: discussed with Ms Love Disposition Plan:   pt will remain inpt for subspecialty evaluation and procedural intervention tomorrow Consults called:  gi Admission status: Inpatient   Consultants:   gi  Procedures:  Ct Abdomen Pelvis W Contrast  Result Date: 03/31/2019 CLINICAL DATA:  73 year old male with history of elevated liver function tests. Hyperbilirubinemia. Jaundice. Unintentional weight loss. EXAM: CT ABDOMEN AND PELVIS WITH CONTRAST TECHNIQUE: Multidetector CT imaging of the abdomen and pelvis was performed using the standard protocol following bolus administration of intravenous contrast. CONTRAST:  141m OMNIPAQUE IOHEXOL 300 MG/ML  SOLN COMPARISON:  No priors. FINDINGS: Lower chest: Mild scarring in the lung bases bilaterally. Aortic atherosclerosis. Status post median sternotomy for CABG. Hepatobiliary: Subcentimeter low-attenuation lesion in segment 5 of the liver, too small to characterize, but statistically likely to represent a cyst. No other suspicious hepatic lesions. Mild intra hepatic biliary ductal dilatation. Common bile duct is also markedly dilated measuring 17 mm in the porta hepatis. Common bile duct abruptly terminates in the region of the head of the pancreas where there is a poorly defined slightly hyperenhancing area estimated to measure approximately 2.8 x 1.5 cm (axial image 34 of series 3). Small calcified gallstones lying dependently in the gallbladder. No findings to suggest an acute cholecystitis at this time. Pancreas: Subtle hyperenhancing lesion in the superior aspect of the pancreatic head (axial image 34 of series 3) measuring up to 2.8 x 1.5 cm. This mass is well separated from the superior mesenteric artery and vein, splenic vein, splenoportal confluence and portal vein. Body and tail of the pancreas are normal in appearance. No pancreatic ductal dilatation. No peripancreatic fluid collections or inflammatory changes. Spleen: Unremarkable. Adrenals/Urinary Tract: 1.5 x  1.1 cm nodule in the medial limb of the left adrenal gland (axial image 21 of series 3), indeterminate. Bilateral kidneys and the right  adrenal gland are normal in appearance. No hydroureteronephrosis. Urinary bladder is normal in appearance. Stomach/Bowel: Normal appearance of the stomach. No pathologic dilatation of small bowel or colon. Normal appendix. Vascular/Lymphatic: Aortic atherosclerosis, without evidence of aneurysm or dissection in the abdominal or pelvic vasculature. Vascular findings relevant to the potential pancreatic neoplasm, as detailed above. No lymphadenopathy noted in the abdomen or pelvis. Reproductive: Prostate gland and seminal vesicles are unremarkable in appearance. Other: No significant volume of ascites.  No pneumoperitoneum. Musculoskeletal: There are no aggressive appearing lytic or blastic lesions noted in the visualized portions of the skeleton. T12 compression fracture with 40% loss of anterior vertebral body height which appears chronic. IMPRESSION: 1. 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 At this time, this appears to be obstructing the common bile duct which is dilated measuring 17 mm in the porta hepatis. There is also mild intrahepatic biliary ductal dilatation. 2. 1.5 x 1.1 cm nodule in the medial limb of the left adrenal gland, indeterminate. The possibility of a metastatic lesion should be considered, and close attention on any future follow-up imaging is recommended to ensure stability. 3. Cholelithiasis without evidence of acute cholecystitis at this time. 4. Aortic atherosclerosis.  Status post median sternotomy for CABG. 5. Additional incidental findings, as above. Electronically Signed   By: Vinnie Langton M.D.   On: 03/31/2019 18:04     Antimicrobials:   none   Subjective: No acute events Did have some nausea in room this AM  Objective: Vitals:   03/31/19 2200 03/31/19 2259 03/31/19 2300 04/01/19 0540  BP: 138/73  (!)  145/73 (!) 142/72  Pulse:   (!) 51 (!) 49  Resp: '20  16 15  ' Temp:   98.2 F (36.8 C) 98.3 F (36.8 C)  TempSrc:   Oral Oral  SpO2:   95% 94%  Weight:  90.8 kg    Height:  '5\' 10"'  (1.778 m)      Intake/Output Summary (Last 24 hours) at 04/01/2019 1317 Last data filed at 04/01/2019 0500 Gross per 24 hour  Intake 407.5 ml  Output --  Net 407.5 ml   Filed Weights   03/31/19 2259  Weight: 90.8 kg    Examination:  General exam: Appears calm and comfortable, JAUNDICED Respiratory system: Clear to auscultation. Respiratory effort normal. Cardiovascular system: S1 & S2 heard, RRR. No JVD, murmurs, rubs, gallops or clicks. No pedal edema. Gastrointestinal system: Abdomen is nondistended, soft and MILDLY tender. No organomegaly or masses felt. Normal bowel sounds heard. Central nervous system: Alert and oriented. No focal neurological deficits. Extremities: WWP, neurovasc intact Skin: No rashes, lesions or ulcers, jaundiced Psychiatry: Judgement and insight appear normal. Mood & affect appropriate.     Data Reviewed: I have personally reviewed following labs and imaging studies  CBC: Recent Labs  Lab 03/31/19 1332  WBC 7.4  HGB 13.2  HCT 38.8*  MCV 94.2  PLT 889   Basic Metabolic Panel: Recent Labs  Lab 03/31/19 1332  NA 139  K 4.3  CL 104  CO2 25  GLUCOSE 117*  BUN 8  CREATININE 0.90  CALCIUM 9.2   GFR: Estimated Creatinine Clearance: 81.6 mL/min (by C-G formula based on SCr of 0.9 mg/dL). Liver Function Tests: Recent Labs  Lab 03/31/19 1332  AST 411*  ALT 324*  ALKPHOS 1,166*  BILITOT 14.1*  PROT 6.9  ALBUMIN 3.0*   Recent Labs  Lab 03/31/19 1332  LIPASE 65*   No results for input(s): AMMONIA in the last 168 hours. Coagulation Profile: Recent Labs  Lab 03/31/19 1630  INR 1.1   Cardiac Enzymes: No results for input(s): CKTOTAL, CKMB, CKMBINDEX, TROPONINI in the last 168 hours. BNP (last 3 results) No results for input(s): PROBNP in the last  8760 hours. HbA1C: No results for input(s): HGBA1C in the last 72 hours. CBG: No results for input(s): GLUCAP in the last 168 hours. Lipid Profile: No results for input(s): CHOL, HDL, LDLCALC, TRIG, CHOLHDL, LDLDIRECT in the last 72 hours. Thyroid Function Tests: No results for input(s): TSH, T4TOTAL, FREET4, T3FREE, THYROIDAB in the last 72 hours. Anemia Panel: No results for input(s): VITAMINB12, FOLATE, FERRITIN, TIBC, IRON, RETICCTPCT in the last 72 hours. Sepsis Labs: No results for input(s): PROCALCITON, LATICACIDVEN in the last 168 hours.  Recent Results (from the past 240 hour(s))  SARS CORONAVIRUS 2 (TAT 6-24 HRS) Nasopharyngeal Nasopharyngeal Swab     Status: None   Collection Time: 03/31/19 10:19 PM   Specimen: Nasopharyngeal Swab  Result Value Ref Range Status   SARS Coronavirus 2 NEGATIVE NEGATIVE Final    Comment: (NOTE) SARS-CoV-2 target nucleic acids are NOT DETECTED. The SARS-CoV-2 RNA is generally detectable in upper and lower respiratory specimens during the acute phase of infection. Negative results do not preclude SARS-CoV-2 infection, do not rule out co-infections with other pathogens, and should not be used as the sole basis for treatment or other patient management decisions. Negative results must be combined with clinical observations, patient history, and epidemiological information. The expected result is Negative. Fact Sheet for Patients: SugarRoll.be Fact Sheet for Healthcare Providers: https://www.woods-mathews.com/ This test is not yet approved or cleared by the [place] FDA and  has been authorized for detection and/or diagnosis of SARS-CoV-2 by FDA under an Emergency Use Authorization (EUA). This EUA will remain  in effect (meaning this test can be used) for the duration of the COVID-19 declaration under Section 56 4(b)(1) of the Act, 21 U.S.C. section 360bbb-3(b)(1), unless the authorization is  terminated or revoked sooner. Performed at [facility], [place], [place], [place] [address]          Radiology Studies: Ct Abdomen Pelvis W Contrast  Result Date: 03/31/2019 CLINICAL DATA:  73 year old male with history of elevated liver function tests. Hyperbilirubinemia. Jaundice. Unintentional weight loss. EXAM: CT ABDOMEN AND PELVIS WITH CONTRAST TECHNIQUE: Multidetector CT imaging of the abdomen and pelvis was performed using the standard protocol following bolus administration of intravenous contrast. CONTRAST:  124m OMNIPAQUE IOHEXOL 300 MG/ML  SOLN COMPARISON:  No priors. FINDINGS: Lower chest: Mild scarring in the lung bases bilaterally. Aortic atherosclerosis. Status post median sternotomy for CABG. Hepatobiliary: Subcentimeter low-attenuation lesion in segment 5 of the liver, too small to characterize, but statistically likely to represent a cyst. No other suspicious hepatic lesions. Mild intra hepatic biliary ductal dilatation. Common bile duct is also markedly dilated measuring 17 mm in the porta hepatis. Common bile duct abruptly terminates in the region of the head of the pancreas where there is a poorly defined slightly hyperenhancing area estimated to measure approximately 2.8 x 1.5 cm (axial image 34 of series 3). Small calcified gallstones lying dependently in the gallbladder. No findings to suggest an acute cholecystitis at this time. Pancreas: Subtle hyperenhancing lesion in  the superior aspect of the pancreatic head (axial image 34 of series 3) measuring up to 2.8 x 1.5 cm. This mass is well separated from the superior mesenteric artery and vein, splenic vein, splenoportal confluence and portal vein. Body and tail of the pancreas are normal in appearance. No pancreatic ductal dilatation. No peripancreatic fluid collections or inflammatory changes. Spleen: Unremarkable. Adrenals/Urinary Tract: 1.5 x 1.1 cm nodule in the medial limb of the left adrenal gland (axial  image 21 of series 3), indeterminate. Bilateral kidneys and the right adrenal gland are normal in appearance. No hydroureteronephrosis. Urinary bladder is normal in appearance. Stomach/Bowel: Normal appearance of the stomach. No pathologic dilatation of small bowel or colon. Normal appendix. Vascular/Lymphatic: Aortic atherosclerosis, without evidence of aneurysm or dissection in the abdominal or pelvic vasculature. Vascular findings relevant to the potential pancreatic neoplasm, as detailed above. No lymphadenopathy noted in the abdomen or pelvis. Reproductive: Prostate gland and seminal vesicles are unremarkable in appearance. Other: No significant volume of ascites.  No pneumoperitoneum. Musculoskeletal: There are no aggressive appearing lytic or blastic lesions noted in the visualized portions of the skeleton. T12 compression fracture with 40% loss of anterior vertebral body height which appears chronic. IMPRESSION: 1. 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 At this time, this appears to be obstructing the common bile duct which is dilated measuring 17 mm in the porta hepatis. There is also mild intrahepatic biliary ductal dilatation. 2. 1.5 x 1.1 cm nodule in the medial limb of the left adrenal gland, indeterminate. The possibility of a metastatic lesion should be considered, and close attention on any future follow-up imaging is recommended to ensure stability. 3. Cholelithiasis without evidence of acute cholecystitis at this time. 4. Aortic atherosclerosis.  Status post median sternotomy for CABG. 5. Additional incidental findings, as above. Electronically Signed   By: Vinnie Langton M.D.   On: 03/31/2019 18:04        Scheduled Meds:  [START ON 04/02/2019] enoxaparin (LOVENOX) injection  40 mg Subcutaneous Q24H   isosorbide mononitrate   30 mg Oral Daily   lisinopril  20 mg Oral Daily   pantoprazole  40 mg Oral Daily   senna  1 tablet Oral BID   sertraline  200 mg Oral Daily   Continuous Infusions:  dextrose 5 % and 0.45% NaCl 50 mL/hr at 03/31/19 2051     LOS: 1 day    Time spent: 35 min    Nicolette Bang, MD [facility]  If 7PM-7AM, please contact night-coverage  04/01/2019, 1:17 PM

## 2019-04-01 NOTE — Anesthesia Preprocedure Evaluation (Addendum)
Anesthesia Evaluation  Patient identified by MRN, date of birth, ID band Patient awake    Reviewed: Allergy & Precautions, NPO status , Patient's Chart, lab work & pertinent test results  Airway Mallampati: I  TM Distance: >3 FB Neck ROM: Full    Dental no notable dental hx. (+) Edentulous Upper, Edentulous Lower   Pulmonary former smoker,    Pulmonary exam normal breath sounds clear to auscultation       Cardiovascular hypertension, Pt. on medications + CAD, + Past MI and + CABG  Normal cardiovascular exam Rhythm:Regular Rate:Normal  03/31/19 EKG SR w PAC   Neuro/Psych negative psych ROS   GI/Hepatic Neg liver ROS, GERD  Medicated,  Endo/Other    Renal/GU K+ 4.3 Cr 0.90     Musculoskeletal   Abdominal   Peds  Hematology  (+) Blood dyscrasia, , Hgb 13.2 Plt 191   Anesthesia Other Findings Jaunidiced  Reproductive/Obstetrics                            Anesthesia Physical Anesthesia Plan  ASA: III  Anesthesia Plan: General   Post-op Pain Management:    Induction: Intravenous  PONV Risk Score and Plan: 3 and Treatment may vary due to age or medical condition, Ondansetron and Dexamethasone  Airway Management Planned: Oral ETT  Additional Equipment: None  Intra-op Plan:   Post-operative Plan: Extubation in OR  Informed Consent: I have reviewed the patients History and Physical, chart, labs and discussed the procedure including the risks, benefits and alternatives for the proposed anesthesia with the patient or authorized representative who has indicated his/her understanding and acceptance.     Dental advisory given  Plan Discussed with:   Anesthesia Plan Comments:         Anesthesia Quick Evaluation

## 2019-04-01 NOTE — Care Management (Addendum)
[place] Notification Line [phone] .   Referral number : [id number]

## 2019-04-01 NOTE — Progress Notes (Signed)
Patient has nose bleed. Put rolled gauze in nostril and applied ice. Patient reports he has a nose bleed almost every week. Continue to monitor.

## 2019-04-01 NOTE — H&P (View-Only) (Signed)
Subjective:   HPI  The patient is a 73 year old male who we are asked to see in regards to obstructive jaundice and a mass in the pancreas. He was found to have abnormal liver enzymes and was told to go to the emergency room. He has noticed yellow jaundice recently as well as recent light-colored stools and dark urine. Liver enzymes were reviewed and are elevated. A CT scan of the abdomen was done showing a subtle hyper vascular lesion in the superior aspect of the pancreatic head concerning for primary pancreatic neoplasm or potentially a hypervascular pancreatic metastases. This lesion appears to be obstructing the common bile duct which was dilated up to 17 mm. Gallstones were also noted. A left adrenal nodule also noted.  Review of Systems No chest pain or shortness of breath   Past Medical History:  Diagnosis Date  . Arthritis    "hands, fingers" (02/03/2013)  . Blood dyscrasia    "told I was a free bleeder when I was young; my blood does have trouble clotting" (02/03/2013)  . Coronary artery disease    s/p CABG  . GERD (gastroesophageal reflux disease)   . Hyperlipidemia   . Hypertension   . Myocardial infarct ([place]) 2000  . Pneumonia    "couple times" (02/03/2013)   Past Surgical History:  Procedure Laterality Date  . CORONARY ARTERY BYPASS GRAFT  2000   "CABG X5" (02/03/2013)  . LEFT HEART CATHETERIZATION WITH CORONARY/GRAFT ANGIOGRAM N/A 02/04/2013   Procedure: LEFT HEART CATHETERIZATION WITH Beatrix Fetters;  Surgeon: Candee Furbish, MD;  Location: [facility] CATH LAB;  Service: Cardiovascular;  Laterality: N/A;  . TONSILLECTOMY  1950's   Social History   Socioeconomic History  . Marital status: Divorced    Spouse name: Not on file  . Number of children: Not on file  . Years of education: Not on file  . Highest education level: Not on file  Occupational History  . Not on file  Social Needs  . Financial resource strain: Not on file  . Food insecurity    Worry: Not on file     Inability: Not on file  . Transportation needs    Medical: Not on file    Non-medical: Not on file  Tobacco Use  . Smoking status: Former Smoker    Packs/day: 1.00    Years: 25.00    Pack years: 25.00    Types: Cigarettes  . Smokeless tobacco: Never Used  . Tobacco comment: 02/03/2013 "quit smoking ~ 25 yr ago"  Substance and Sexual Activity  . Alcohol use: Not Currently    Comment: 02/03/2013 "stopped drinking alcohol in the 1990's; never had problem w/it"  . Drug use: No  . Sexual activity: Yes  Lifestyle  . Physical activity    Days per week: Not on file    Minutes per session: Not on file  . Stress: Not on file  Relationships  . Social Herbalist on phone: Not on file    Gets together: Not on file    Attends religious service: Not on file    Active member of club or organization: Not on file    Attends meetings of clubs or organizations: Not on file    Relationship status: Not on file  . Intimate partner violence    Fear of current or ex partner: Not on file    Emotionally abused: Not on file    Physically abused: Not on file    Forced sexual activity: Not on  file  Other Topics Concern  . Not on file  Social History Narrative  . Not on file   family history includes Heart disease in his father and mother.  Current Facility-Administered Medications:  .  dextrose 5 %-0.45 % sodium chloride infusion, , Intravenous, Continuous, Norins, Heinz Knuckles, MD, Last Rate: 50 mL/hr at 03/31/19 2051 .  enoxaparin (LOVENOX) injection 40 mg, 40 mg, Subcutaneous, Q24H, Norins, Heinz Knuckles, MD, 40 mg at 04/01/19 0946 .  isosorbide mononitrate (IMDUR) 24 hr tablet 30 mg, 30 mg, Oral, Daily, Norins, Heinz Knuckles, MD, 30 mg at 04/01/19 1004 .  ketorolac (TORADOL) 15 MG/ML injection 15 mg, 15 mg, Intravenous, Q6H PRN, Norins, Heinz Knuckles, MD .  lisinopril (ZESTRIL) tablet 20 mg, 20 mg, Oral, Daily, Norins, Heinz Knuckles, MD, 20 mg at 04/01/19 1004 .  nicotine (NICODERM CQ - dosed in mg/24  hours) patch 21 mg, 21 mg, Transdermal, Daily PRN, Norins, Heinz Knuckles, MD .  nitroGLYCERIN (NITROSTAT) SL tablet 0.4 mg, 0.4 mg, Sublingual, Q5 min PRN, Norins, Heinz Knuckles, MD .  pantoprazole (PROTONIX) EC tablet 40 mg, 40 mg, Oral, Daily, Norins, Heinz Knuckles, MD, 40 mg at 04/01/19 1005 .  senna (SENOKOT) tablet 8.6 mg, 1 tablet, Oral, BID, Norins, Heinz Knuckles, MD, 8.6 mg at 04/01/19 1005 .  sertraline (ZOLOFT) tablet 200 mg, 200 mg, Oral, Daily, Norins, Heinz Knuckles, MD, 200 mg at 04/01/19 1005 .  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 Paulita Fujita. I explained ERCP and EUS and biopsy in detail to the patient. I explained the risks of anesthesia, bleeding, infection, perforation, and pancreatitis. He understands and consents to the procedure.

## 2019-04-02 ENCOUNTER — Encounter (HOSPITAL_COMMUNITY)
Admission: EM | Disposition: A | Discharge status: Home / Self Care | Payer: Self-pay | Source: Home / Self Care | Attending: Internal Medicine

## 2019-04-02 ENCOUNTER — Inpatient Hospital Stay (HOSPITAL_COMMUNITY): Payer: No Typology Code available for payment source | Admitting: Anesthesiology

## 2019-04-02 ENCOUNTER — Inpatient Hospital Stay (HOSPITAL_COMMUNITY): Payer: No Typology Code available for payment source

## 2019-04-02 HISTORY — PX: SPHINCTEROTOMY: SHX5544

## 2019-04-02 HISTORY — PX: BILIARY DILATION: SHX6850

## 2019-04-02 HISTORY — PX: ERCP: SHX5425

## 2019-04-02 HISTORY — PX: ESOPHAGOGASTRODUODENOSCOPY (EGD) WITH PROPOFOL: SHX5813

## 2019-04-02 HISTORY — PX: EUS: SHX5427

## 2019-04-02 HISTORY — PX: FINE NEEDLE ASPIRATION: SHX5430

## 2019-04-02 HISTORY — PX: BILIARY STENT PLACEMENT: SHX5538

## 2019-04-02 HISTORY — PX: PANCREATIC STENT PLACEMENT: SHX5539

## 2019-04-02 SURGERY — UPPER ENDOSCOPIC ULTRASOUND (EUS) LINEAR
Anesthesia: General

## 2019-04-02 MED ORDER — CIPROFLOXACIN IN D5W 400 MG/200ML IV SOLN
400.0000 mg | INTRAVENOUS | Status: AC
  Administered 2019-04-02: 400 mg via INTRAVENOUS

## 2019-04-02 MED ORDER — SUGAMMADEX SODIUM 200 MG/2ML IV SOLN
INTRAVENOUS | Status: DC | PRN
  Administered 2019-04-02: 200 mg via INTRAVENOUS

## 2019-04-02 MED ORDER — GLUCAGON HCL RDNA (DIAGNOSTIC) 1 MG IJ SOLR
INTRAMUSCULAR | Status: DC | PRN
  Administered 2019-04-02: .5 mg via INTRAVENOUS
  Administered 2019-04-02: 0.25 mg via INTRAVENOUS

## 2019-04-02 MED ORDER — LACTATED RINGERS IV SOLN
INTRAVENOUS | Status: DC | PRN
  Administered 2019-04-02 (×2): via INTRAVENOUS

## 2019-04-02 MED ORDER — PROPOFOL 10 MG/ML IV BOLUS
INTRAVENOUS | Status: DC | PRN
  Administered 2019-04-02: 120 mg via INTRAVENOUS

## 2019-04-02 MED ORDER — ESMOLOL HCL 100 MG/10ML IV SOLN
INTRAVENOUS | Status: DC | PRN
  Administered 2019-04-02 (×2): 20 mg via INTRAVENOUS
  Administered 2019-04-02 (×2): 30 mg via INTRAVENOUS

## 2019-04-02 MED ORDER — SODIUM CHLORIDE 0.9 % IV SOLN: INTRAVENOUS | Status: DC

## 2019-04-02 MED ORDER — GLUCAGON HCL RDNA (DIAGNOSTIC) 1 MG IJ SOLR
INTRAMUSCULAR | Status: AC
  Filled 2019-04-02: qty 1

## 2019-04-02 MED ORDER — INDOMETHACIN 50 MG RE SUPP
RECTAL | Status: AC
  Filled 2019-04-02: qty 2

## 2019-04-02 MED ORDER — GLYCOPYRROLATE PF 0.2 MG/ML IJ SOSY
PREFILLED_SYRINGE | INTRAMUSCULAR | Status: DC | PRN
  Administered 2019-04-02: .1 mg via INTRAVENOUS

## 2019-04-02 MED ORDER — ONDANSETRON HCL 4 MG/2ML IJ SOLN
INTRAMUSCULAR | Status: DC | PRN
  Administered 2019-04-02: 4 mg via INTRAVENOUS

## 2019-04-02 MED ORDER — DEXAMETHASONE SODIUM PHOSPHATE 10 MG/ML IJ SOLN
INTRAMUSCULAR | Status: DC | PRN
  Administered 2019-04-02: 4 mg via INTRAVENOUS

## 2019-04-02 MED ORDER — CIPROFLOXACIN IN D5W 400 MG/200ML IV SOLN
INTRAVENOUS | Status: AC
  Filled 2019-04-02: qty 200

## 2019-04-02 MED ORDER — INDOMETHACIN 50 MG RE SUPP
RECTAL | Status: DC | PRN
  Administered 2019-04-02: 100 mg via RECTAL

## 2019-04-02 MED ORDER — DEXMEDETOMIDINE HCL 200 MCG/2ML IV SOLN
INTRAVENOUS | Status: DC | PRN
  Administered 2019-04-02: 8 ug via INTRAVENOUS

## 2019-04-02 MED ORDER — SODIUM CHLORIDE 0.9 % IV SOLN
INTRAVENOUS | Status: DC | PRN
  Administered 2019-04-02: 13:00:00 50 mL

## 2019-04-02 MED ORDER — LIDOCAINE 2% (20 MG/ML) 5 ML SYRINGE
INTRAMUSCULAR | Status: DC | PRN
  Administered 2019-04-02: 90 mg via INTRAVENOUS

## 2019-04-02 MED ORDER — ROCURONIUM BROMIDE 10 MG/ML (PF) SYRINGE
PREFILLED_SYRINGE | INTRAVENOUS | Status: DC | PRN
  Administered 2019-04-02 (×2): 10 mg via INTRAVENOUS
  Administered 2019-04-02: 50 mg via INTRAVENOUS
  Administered 2019-04-02: 20 mg via INTRAVENOUS

## 2019-04-02 MED ORDER — PHENYLEPHRINE 40 MCG/ML (10ML) SYRINGE FOR IV PUSH (FOR BLOOD PRESSURE SUPPORT)
PREFILLED_SYRINGE | INTRAVENOUS | Status: DC | PRN
  Administered 2019-04-02: 80 ug via INTRAVENOUS
  Administered 2019-04-02: 120 ug via INTRAVENOUS
  Administered 2019-04-02: 80 ug via INTRAVENOUS

## 2019-04-02 NOTE — Op Note (Signed)
[facility] Patient Name: Isaiah Patterson Procedure Date : 04/02/2019 MRN: [id number] Attending MD: Arta Silence , MD Date of Birth: 01/17/1946 CSN: [id number] Age: 73 Admit Type: Inpatient Procedure:                Upper EUS Indications:              Suspected mass in pancreas on CT scan, Elevated                            liver enzymes Providers:                Arta Silence, MD, Ashley Jacobs, RN, Elspeth Cho Tech., Technician, Lerry Paterson, CRNA Referring MD:             [facility] Medicines:                General Anesthesia Complications:            No immediate complications. Estimated Blood Loss:     Estimated blood loss was minimal. Procedure:                Pre-Anesthesia Assessment:                           - Prior to the procedure, a History and Physical                            was performed, and patient medications and                            allergies were reviewed. The patient's tolerance of                            previous anesthesia was also reviewed. The risks                            and benefits of the procedure and the sedation                            options and risks were discussed with the patient.                            All questions were answered, and informed consent                            was obtained. Prior Anticoagulants: The patient has                            taken no previous anticoagulant or antiplatelet                            agents except for aspirin. ASA Grade Assessment:  III - A patient with severe systemic disease. After                            reviewing the risks and benefits, the patient was                            deemed in satisfactory condition to undergo the                            procedure.                           After obtaining informed consent, the endoscope was                            passed under direct vision.  Throughout the                            procedure, the patient's blood pressure, pulse, and                            oxygen saturations were monitored continuously. The                            [id number]) Olympus Linear EUS scope was                            introduced through the mouth, and advanced to the                            second part of duodenum. The upper EUS was                            accomplished without difficulty. The patient                            tolerated the procedure well. Scope In: Scope Out: Findings:      ENDOSONOGRAPHIC FINDING: :      There was dilation in the common bile duct, in the common hepatic duct       and where the hepatic duct bifurcates into the right and left hepatic       ducts which measured up to 14 mm, abruptly terminating in a mass in the       head of pancreas (see below).      No lymphadenopathy seen.      A round mass was identified in the pancreatic head. The mass was       hypoechoic with upstream biliary ductal dilatation.. The mass measured       11 mm by 12 mm in maximal cross-sectional diameter. The endosonographic       borders were well-defined. Fine needle aspiration for cytology was       performed. Color Doppler imaging was utilized prior to needle puncture       to confirm a lack of significant vascular structures within the needle       path. Three  passes were made with the 25 gauge needle using a       transduodenal approach. A stylet was used. A cytotechnologist was       present to evaluate the adequacy of the specimen. Final cytology results       are pending.      There was no sign of significant endosonographic abnormality in the       ampulla. No pancreatic mass elsewhere. Ampulla appears normal. No       pancreatic ductal dilatation. Impression:               - There was dilation in the common bile duct, in                            the common hepatic duct and in the bifurcation of                             the common hepatic duct which measured up to 14 mm.                           - A mass was identified in the pancreatic head.                            This was staged T2 N0 Mx by endosonographic                            criteria. Fine needle aspiration performed.                           - There was no sign of significant pathology in the                            ampulla. Moderate Sedation:      None Recommendation:           - Await cytology results.                           - Perform an ERCP today. Procedure Code(s):        --- Professional ---                           [phone], Esophagogastroduodenoscopy, flexible,                            transoral; with transendoscopic ultrasound-guided                            intramural or transmural fine needle                            aspiration/biopsy(s) (includes endoscopic                            ultrasound examination of the esophagus, stomach,  and either the duodenum or a surgically altered                            stomach where the jejunum is examined distal to the                            anastomosis) Diagnosis Code(s):        --- Professional ---                           K86.89, Other specified diseases of pancreas                           R74.8, Abnormal levels of other serum enzymes                           K83.8, Other specified diseases of biliary tract                           R93.3, Abnormal findings on diagnostic imaging of                            other parts of digestive tract CPT copyright 2019 [employer]. All rights reserved. The codes documented in this report are preliminary and upon coder review may  be revised to meet current compliance requirements. Arta Silence, MD 04/02/2019 12:26:58 PM This report has been signed electronically. Number of Addenda: 0

## 2019-04-02 NOTE — Anesthesia Postprocedure Evaluation (Signed)
Anesthesia Post Note  Patient: Isaiah Patterson  Procedure(s) Performed: UPPER ENDOSCOPIC ULTRASOUND (EUS) LINEAR (N/A ) FINE NEEDLE ASPIRATION (FNA) LINEAR ENDOSCOPIC RETROGRADE CHOLANGIOPANCREATOGRAPHY (ERCP) (N/A ) PANCREATIC STENT PLACEMENT SPHINCTEROTOMY BILIARY DILATION BILIARY STENT PLACEMENT     Patient location during evaluation: PACU Anesthesia Type: General Level of consciousness: awake and alert and oriented Pain management: pain level controlled Vital Signs Assessment: post-procedure vital signs reviewed and stable Respiratory status: spontaneous breathing, nonlabored ventilation and respiratory function stable Cardiovascular status: blood pressure returned to baseline Postop Assessment: no apparent nausea or vomiting Anesthetic complications: no    Last Vitals:  Vitals:   04/02/19 1253 04/02/19 1315  BP: 131/69 (!) 146/74  Pulse: (!) 40 (!) 44  Resp: (!) 23 20  Temp:  36.9 C  SpO2: 93% 94%    Last Pain:  Vitals:   04/02/19 1315  TempSrc: Oral  PainSc:                  Brennan Bailey

## 2019-04-02 NOTE — Op Note (Signed)
Isaiah Patterson Patient Name: Isaiah Patterson Procedure Date : 04/02/2019 MRN: [id number] Attending MD: Isaiah Patterson , MD Date of Birth: 08-12-1945 CSN: [id number] Age: 73 Admit Type: Inpatient Procedure:                ERCP Indications:              Biliary dilation on Computed Tomogram Scan,                            Elevated liver enzymes, Tumor of the head of                            pancreas, obstructive jaundice Providers:                Isaiah Silence, MD, Isaiah Jacobs, RN, Isaiah Patterson Tech., Technician, Isaiah Paterson, CRNA Referring MD:             Isaiah Patterson Medicines:                General Anesthesia, Indomethacin 100 mg PR, Cipro                            400 mg IV, Glucagon 1 mg IV Complications:            No immediate complications. Estimated Blood Loss:     Estimated blood loss was minimal. Procedure:                Pre-Anesthesia Assessment:                           - Prior to the procedure, a History and Physical                            was performed, and patient medications and                            allergies were reviewed. The patient's tolerance of                            previous anesthesia was also reviewed. The risks                            and benefits of the procedure and the sedation                            options and risks were discussed with the patient.                            All questions were answered, and informed consent                            was obtained. Prior Anticoagulants: The patient has  taken no previous anticoagulant or antiplatelet                            agents except for aspirin. ASA Grade Assessment:                            III - A patient with severe systemic disease. After                            reviewing the risks and benefits, the patient was                            deemed in satisfactory condition to undergo the                         procedure.                           After obtaining informed consent, the scope was                            passed under direct vision. Throughout the                            procedure, the patient's blood pressure, pulse, and                            oxygen saturations were monitored continuously. The                            [id number] Olympus duodenoscope was                            introduced through the mouth, and used to inject                            contrast into and used to inject contrast into the                            bile duct. The ERCP was technically difficult and                            complex due to challenging cannulation. The patient                            tolerated the procedure well. Scope In: Scope Out: Findings:      The scout film was normal. The esophagus was successfully intubated       under direct vision. The scope was advanced to a normal major papilla in       the descending duodenum without detailed examination of the pharynx,       larynx and associated structures, and upper GI tract. The upper GI tract       was grossly normal. Wire preferentially passed into pancreas; as a  result, one 5 Fr by 5 cm plastic stent with a full external pigtail and       a single internal flap was placed 3.5 cm into the ventral pancreatic       duct. The stent was in good position. Attempts to cannulate the bile       duct with the sphincterotome over the pancreatic stent were       unsuccessful; as a result, needle knife was used to make sphincterotomy       over the pancreatic stent, and after deep biliary cannulation was       obtained (see below), the sphincterotomy was extended with standard       sphincterotome, for a total length 5 mm biliary sphincterotomy. The       sphincterotomy oozed blood which stopped with tincture of time. 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 To       complete the procedure, one 10 mm by 6 cm fully covered metal stent with       no external flaps and no internal flaps was placed 5 cm into the common       bile duct. Bile flowed through the stent. The stent was in good position       endoscopically and fluoroscopically. 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                           - One plastic stent was placed into the ventral                            pancreatic duct.                           - A biliary sphincterotomy was performed.                           - Common bile duct was successfully dilated.                           - One covered metal stent was placed into the                            common bile duct. Moderate Sedation:      None Recommendation:           - Avoid aspirin and nonsteroidal anti-inflammatory  medicines for 5 days.                           - Await cytology results.                           - Watch for pancreatitis, bleeding, perforation,                            and cholangitis.                           - Return patient to hospital ward for ongoing care.                           [facility] will follow. Procedure Code(s):        --- Professional ---                           [phone], Endoscopic retrograde                            cholangiopancreatography (ERCP); with placement of                            endoscopic stent into biliary or pancreatic duct,                            including pre- and post-dilation and guide wire                            passage,  when performed, including sphincterotomy,                            when performed, each stent                           [id number], 53, Endoscopic retrograde                            cholangiopancreatography (ERCP); with placement of                            endoscopic stent into biliary or pancreatic duct,                            including pre- and post-dilation and guide wire                            passage, when performed, including sphincterotomy,                            when performed, each stent Diagnosis Code(s):        --- Professional ---                           R74.8, Abnormal levels  of other serum enzymes                           D49.0, Neoplasm of unspecified behavior of                            digestive system                           K83.8, Other specified diseases of biliary tract CPT copyright 2019 [employer]. All rights reserved. The codes documented in this report are preliminary and upon coder review may  be revised to meet current compliance requirements. Isaiah Silence, MD 04/02/2019 12:43:40 PM This report has been signed electronically. Number of Addenda: 0

## 2019-04-02 NOTE — Progress Notes (Signed)
PROGRESS NOTE    BRIGG CAPE  EXH:[id number] DOB: [birth year] DOA: 03/31/2019 PCP: Lennie Odor, PA-C   Brief Narrative:  73 year old white male with a past male history notable for worsening reflux, CAD status post CABG and MI who presents to the ER because of painless jaundice.  Patient reports been feeling poorly for approximately a month and was seen at the [facility] clinic in [place] on November 30 and had some lab work done.  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 -Notable 2.8x1.5 lesion with an obstructive pattern -CT did not identify vascular involvement -Presentation certainly concerning for pancreatic cancer versus endocrine tumor -GI consultation now s/p ERCP and biopsy 12/3 concerning for adenocarcinoma. -GI following bx, possible gen surg consultation fri, f/up GI recs  Hypertension: -Continue home medications titrate as blood pressure tolerates  Hyperbilirubinemia/elevated alk phos/transaminitis -Consistent with obstructive pattern from mass-effect from the lesion -Anticipate this will improve with stent placement -Monitor closely  Hyperlipidemia: -Patient continue with statin controlled on discharge.  CAD: -No anginal symptomatology -Patient continue his home medications without acute change  DVT prophylaxis: Lovenox SQ  Code Status: full    Code Status Orders  (From admission, onward)         Start     Ordered   03/31/19 1918  Full code  Continuous     03/31/19 1919        Code Status History    This patient has a current code status but no historical code status.   Advance Care Planning  Activity     Family Communication: none today  Disposition Plan:   Patient remain inpatient for observation post procedure, possible surgical intervention. Consults called: None Admission status: Inpatient   Consultants:   GI  Procedures:  Ct Abdomen Pelvis W Contrast  Result Date: 03/31/2019 CLINICAL DATA:  73 year old male with history of elevated liver function tests. Hyperbilirubinemia. Jaundice. Unintentional weight loss. EXAM: CT ABDOMEN AND PELVIS WITH CONTRAST TECHNIQUE: Multidetector CT imaging of the abdomen and pelvis was performed using the standard protocol following bolus administration of intravenous contrast. CONTRAST:  173m OMNIPAQUE IOHEXOL 300 MG/ML  SOLN COMPARISON:  No priors. FINDINGS: Lower chest: Mild scarring in the lung bases bilaterally. Aortic atherosclerosis. Status post median sternotomy for CABG. Hepatobiliary: Subcentimeter low-attenuation lesion in segment 5 of the liver, too small to characterize, but statistically likely to represent a cyst. No other suspicious hepatic lesions. Mild intra hepatic biliary ductal dilatation. Common bile duct is also markedly dilated measuring 17 mm in the porta hepatis. Common bile duct abruptly terminates in the region of the head of the pancreas where there is a poorly defined slightly hyperenhancing area estimated to measure approximately 2.8 x 1.5 cm (axial image 34 of series 3). Small calcified gallstones lying dependently in the gallbladder. No findings to suggest an acute cholecystitis at this time. Pancreas: Subtle hyperenhancing lesion in the superior aspect of the pancreatic head (axial image 34 of series 3) measuring up to 2.8 x 1.5 cm. This mass is well separated from the superior mesenteric artery and vein, splenic vein, splenoportal confluence and portal vein. Body and tail  of the pancreas are normal in appearance. No pancreatic ductal dilatation. No peripancreatic fluid collections or inflammatory changes. Spleen:  Unremarkable. Adrenals/Urinary Tract: 1.5 x 1.1 cm nodule in the medial limb of the left adrenal gland (axial image 21 of series 3), indeterminate. Bilateral kidneys and the right adrenal gland are normal in appearance. No hydroureteronephrosis. Urinary bladder is normal in appearance. Stomach/Bowel: Normal appearance of the stomach. No pathologic dilatation of small bowel or colon. Normal appendix. Vascular/Lymphatic: Aortic atherosclerosis, without evidence of aneurysm or dissection in the abdominal or pelvic vasculature. Vascular findings relevant to the potential pancreatic neoplasm, as detailed above. No lymphadenopathy noted in the abdomen or pelvis. Reproductive: Prostate gland and seminal vesicles are unremarkable in appearance. Other: No significant volume of ascites.  No pneumoperitoneum. Musculoskeletal: There are no aggressive appearing lytic or blastic lesions noted in the visualized portions of the skeleton. T12 compression fracture with 40% loss of anterior vertebral body height which appears chronic. IMPRESSION: 1. 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 At this time, this appears to be obstructing the common bile duct which is dilated measuring 17 mm in the porta hepatis. There is also mild intrahepatic biliary ductal dilatation. 2. 1.5 x 1.1 cm nodule in the medial limb of the left adrenal gland, indeterminate. The possibility of a metastatic lesion should be considered, and close attention on any future follow-up imaging is recommended to ensure stability. 3. Cholelithiasis without evidence of acute cholecystitis at this time. 4. Aortic atherosclerosis.  Status post median sternotomy for CABG. 5. Additional incidental findings, as above. Electronically Signed   By: Vinnie Langton M.D.   On: 03/31/2019 18:04     Antimicrobials:    none    Subjective: No acute events overnight ERCP/EUS today  Objective: Vitals:   04/02/19 0848 04/02/19 1232 04/02/19 1243 04/02/19 1253  BP: (!) 145/59 132/60 132/64 131/69  Pulse: (!) 43 (!) 46 (!) 40 (!) 40  Resp: '16 17 16 ' (!) 23  Temp: 97.8 F (36.6 C)     TempSrc: Temporal Temporal    SpO2: 95% 92% 93% 93%  Weight: 91 kg     Height:        Intake/Output Summary (Last 24 hours) at 04/02/2019 1309 Last data filed at 04/02/2019 1235 Gross per 24 hour  Intake 1300 ml  Output --  Net 1300 ml   Filed Weights   03/31/19 2259 04/02/19 0848  Weight: 90.8 kg 91 kg    Examination:  General exam: Appears calm and comfortable, JAUNDICED Respiratory system: Clear to auscultation. Respiratory effort normal. Cardiovascular system: S1 & S2 heard, RRR. No JVD, murmurs, rubs, gallops or clicks. No pedal edema. Gastrointestinal system: Abdomen is nondistended, soft and MILDLY tender. No organomegaly or masses felt. Normal bowel sounds heard. Central nervous system: Alert and oriented. No focal neurological deficits. Extremities: WWP, neurovasc intact Skin: No rashes, lesions or ulcers, jaundiced Psychiatry: Judgement and insight appear normal. Mood & affect appropriateappropriate.     Data Reviewed: I have personally reviewed following labs and imaging studies  CBC: Recent Labs  Lab 03/31/19 1332  WBC 7.4  HGB 13.2  HCT 38.8*  MCV 94.2  PLT 045   Basic Metabolic Panel: Recent Labs  Lab 03/31/19 1332  NA 139  K 4.3  CL 104  CO2 25  GLUCOSE 117*  BUN 8  CREATININE 0.90  CALCIUM 9.2  GFR: Estimated Creatinine Clearance: 81.7 mL/min (by C-G formula based on SCr of 0.9 mg/dL). Liver Function Tests: Recent Labs  Lab 03/31/19 1332  AST 411*  ALT 324*  ALKPHOS 1,166*  BILITOT 14.1*  PROT 6.9  ALBUMIN 3.0*   Recent Labs  Lab 03/31/19 1332  LIPASE 65*   No results for input(s): AMMONIA in the last 168 hours. Coagulation Profile: Recent Labs  Lab  03/31/19 1630  INR 1.1   Cardiac Enzymes: No results for input(s): CKTOTAL, CKMB, CKMBINDEX, TROPONINI in the last 168 hours. BNP (last 3 results) No results for input(s): PROBNP in the last 8760 hours. HbA1C: No results for input(s): HGBA1C in the last 72 hours. CBG: No results for input(s): GLUCAP in the last 168 hours. Lipid Profile: No results for input(s): CHOL, HDL, LDLCALC, TRIG, CHOLHDL, LDLDIRECT in the last 72 hours. Thyroid Function Tests: No results for input(s): TSH, T4TOTAL, FREET4, T3FREE, THYROIDAB in the last 72 hours. Anemia Panel: No results for input(s): VITAMINB12, FOLATE, FERRITIN, TIBC, IRON, RETICCTPCT in the last 72 hours. Sepsis Labs: No results for input(s): PROCALCITON, LATICACIDVEN in the last 168 hours.  Recent Results (from the past 240 hour(s))  SARS CORONAVIRUS 2 (TAT 6-24 HRS) Nasopharyngeal Nasopharyngeal Swab     Status: None   Collection Time: 03/31/19 10:19 PM   Specimen: Nasopharyngeal Swab  Result Value Ref Range Status   SARS Coronavirus 2 NEGATIVE NEGATIVE Final    Comment: (NOTE) SARS-CoV-2 target nucleic acids are NOT DETECTED. The SARS-CoV-2 RNA is generally detectable in upper and lower respiratory specimens during the acute phase of infection. Negative results do not preclude SARS-CoV-2 infection, do not rule out co-infections with other pathogens, and should not be used as the sole basis for treatment or other patient management decisions. Negative results must be combined with clinical observations, patient history, and epidemiological information. The expected result is Negative. Fact Sheet for Patients: SugarRoll.be Fact Sheet for Healthcare Providers: https://www.woods-mathews.com/ This test is not yet approved or cleared by the [place] FDA and  has been authorized for detection and/or diagnosis of SARS-CoV-2 by FDA under an Emergency Use Authorization (EUA). This EUA will  remain  in effect (meaning this test can be used) for the duration of the COVID-19 declaration under Section 56 4(b)(1) of the Act, 21 U.S.C. section 360bbb-3(b)(1), unless the authorization is terminated or revoked sooner. Performed at [facility], [place] [address], [place], [place] [address]          Radiology Studies: Ct Abdomen Pelvis W Contrast  Result Date: 03/31/2019 CLINICAL DATA:  73 year old male with history of elevated liver function tests. Hyperbilirubinemia. Jaundice. Unintentional weight loss. EXAM: CT ABDOMEN AND PELVIS WITH CONTRAST TECHNIQUE: Multidetector CT imaging of the abdomen and pelvis was performed using the standard protocol following bolus administration of intravenous contrast. CONTRAST:  148m OMNIPAQUE IOHEXOL 300 MG/ML  SOLN COMPARISON:  No priors. FINDINGS: Lower chest: Mild scarring in the lung bases bilaterally. Aortic atherosclerosis. Status post median sternotomy for CABG. Hepatobiliary: Subcentimeter low-attenuation lesion in segment 5 of the liver, too small to characterize, but statistically likely to represent a cyst. No other suspicious hepatic lesions. Mild intra hepatic biliary ductal dilatation. Common bile duct is also markedly dilated measuring 17 mm in the porta hepatis. Common bile duct abruptly terminates in the region of the head of the pancreas where there is a poorly defined slightly hyperenhancing area estimated to measure approximately 2.8 x 1.5 cm (axial image 34 of series 3). Small calcified  gallstones lying dependently in the gallbladder. No findings to suggest an acute cholecystitis at this time. Pancreas: Subtle hyperenhancing lesion in the superior aspect of the pancreatic head (axial image 34 of series 3) measuring up to 2.8 x 1.5 cm. This mass is well separated from the superior mesenteric artery and vein, splenic vein, splenoportal confluence and portal vein. Body and tail of the pancreas are normal in appearance. No pancreatic  ductal dilatation. No peripancreatic fluid collections or inflammatory changes. Spleen: Unremarkable. Adrenals/Urinary Tract: 1.5 x 1.1 cm nodule in the medial limb of the left adrenal gland (axial image 21 of series 3), indeterminate. Bilateral kidneys and the right adrenal gland are normal in appearance. No hydroureteronephrosis. Urinary bladder is normal in appearance. Stomach/Bowel: Normal appearance of the stomach. No pathologic dilatation of small bowel or colon. Normal appendix. Vascular/Lymphatic: Aortic atherosclerosis, without evidence of aneurysm or dissection in the abdominal or pelvic vasculature. Vascular findings relevant to the potential pancreatic neoplasm, as detailed above. No lymphadenopathy noted in the abdomen or pelvis. Reproductive: Prostate gland and seminal vesicles are unremarkable in appearance. Other: No significant volume of ascites.  No pneumoperitoneum. Musculoskeletal: There are no aggressive appearing lytic or blastic lesions noted in the visualized portions of the skeleton. T12 compression fracture with 40% loss of anterior vertebral body height which appears chronic. IMPRESSION: 1. 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 At this time, this appears to be obstructing the common bile duct which is dilated measuring 17 mm in the porta hepatis. There is also mild intrahepatic biliary ductal dilatation. 2. 1.5 x 1.1 cm nodule in the medial limb of the left adrenal gland, indeterminate. The possibility of a metastatic lesion should be considered, and close attention on any future follow-up imaging is recommended to ensure stability. 3. Cholelithiasis without evidence of acute cholecystitis at this time. 4. Aortic atherosclerosis.  Status post median sternotomy for CABG. 5. Additional incidental findings, as above.  Electronically Signed   By: Vinnie Langton M.D.   On: 03/31/2019 18:04        Scheduled Meds:  [MAR Hold] enoxaparin (LOVENOX) injection  40 mg Subcutaneous Q24H   [MAR Hold] isosorbide mononitrate  30 mg Oral Daily   [MAR Hold] lisinopril  20 mg Oral Daily   [MAR Hold] pantoprazole  40 mg Oral Daily   [MAR Hold] senna  1 tablet Oral BID   [MAR Hold] sertraline  200 mg Oral Daily   Continuous Infusions:  sodium chloride     dextrose 5 % and 0.45% NaCl 50 mL/hr at 04/01/19 1733     LOS: 2 days    Time spent: 71 min    Nicolette Bang, MD [facility]  If 7PM-7AM, please contact night-coverage  04/02/2019, 1:09 PM

## 2019-04-02 NOTE — Interval H&P Note (Signed)
History and Physical Interval Note:  04/02/2019 9:37 AM  Isaiah Patterson  has presented today for surgery, with the diagnosis of pancreatic mass, obstructive jaundice.  The various methods of treatment have been discussed with the patient and family. After consideration of risks, benefits and other options for treatment, the patient has consented to  Procedure(s): UPPER ENDOSCOPIC ULTRASOUND (EUS) LINEAR (N/A) ENDOSCOPIC RETROGRADE CHOLANGIOPANCREATOGRAPHY (ERCP) (N/A) as a surgical intervention.  The patient's history has been reviewed, patient examined, no change in status, stable for surgery.  I have reviewed the patient's chart and labs.  Questions were answered to the patient's satisfaction.     Landry Dyke

## 2019-04-02 NOTE — Transfer of Care (Signed)
Immediate Anesthesia Transfer of Care Note  Patient: Isaiah Patterson  Procedure(s) Performed: UPPER ENDOSCOPIC ULTRASOUND (EUS) LINEAR (N/A ) FINE NEEDLE ASPIRATION (FNA) LINEAR ENDOSCOPIC RETROGRADE CHOLANGIOPANCREATOGRAPHY (ERCP) (N/A ) PANCREATIC STENT PLACEMENT SPHINCTEROTOMY BILIARY DILATION BILIARY STENT PLACEMENT  Patient Location: PACU  Anesthesia Type:General  Level of Consciousness: awake  Airway & Oxygen Therapy: Patient Spontanous Breathing and Patient connected to face mask oxygen  Post-op Assessment: Report given to RN and Post -op Vital signs reviewed and stable  Post vital signs: Reviewed and stable  Last Vitals:  Vitals Value Taken Time  BP 132/60 04/02/19 1232  Temp    Pulse 50 04/02/19 1235  Resp 20 04/02/19 1235  SpO2 93 % 04/02/19 1235  Vitals shown include unvalidated device data.  Last Pain:  Vitals:   04/02/19 0848  TempSrc: Temporal  PainSc: 0-No pain         Complications: No apparent anesthesia complications

## 2019-04-02 NOTE — Progress Notes (Signed)
IV fluids not resumed post ERCP today, pt refuses to have IV fluids restarted. Pt has been taking clear liquids in well this afternoon and evening. Message was sent to Dr Wyonia Hough this afternoon when pt was back from ERCP about the above.

## 2019-04-02 NOTE — Anesthesia Procedure Notes (Signed)
Procedure Name: Intubation Performed by: Charistopher Rumble H, CRNA Pre-anesthesia Checklist: Patient identified, Emergency Drugs available, Suction available and Patient being monitored Patient Re-evaluated:Patient Re-evaluated prior to induction Oxygen Delivery Method: Circle System Utilized Preoxygenation: Pre-oxygenation with 100% oxygen Induction Type: IV induction Ventilation: Mask ventilation without difficulty and Oral airway inserted - appropriate to patient size Laryngoscope Size: Mac and 4 Grade View: Grade I Tube type: Oral Tube size: 7.5 mm Number of attempts: 1 Airway Equipment and Method: Stylet and Oral airway Placement Confirmation: ETT inserted through vocal cords under direct vision,  positive ETCO2 and breath sounds checked- equal and bilateral Secured at: 23 cm Tube secured with: Tape Dental Injury: Teeth and Oropharynx as per pre-operative assessment        

## 2019-04-03 ENCOUNTER — Encounter (HOSPITAL_COMMUNITY): Payer: Self-pay | Admitting: Gastroenterology

## 2019-04-03 DIAGNOSIS — I159 Secondary hypertension, unspecified: Secondary | ICD-10-CM

## 2019-04-03 LAB — COMPREHENSIVE METABOLIC PANEL
ALT: 265 U/L — ABNORMAL HIGH (ref 0–44)
AST: 318 U/L — ABNORMAL HIGH (ref 15–41)
Albumin: 2.6 g/dL — ABNORMAL LOW (ref 3.5–5.0)
Alkaline Phosphatase: 921 U/L — ABNORMAL HIGH (ref 38–126)
Anion gap: 9 (ref 5–15)
BUN: 8 mg/dL (ref 8–23)
CO2: 25 mmol/L (ref 22–32)
Calcium: 8.5 mg/dL — ABNORMAL LOW (ref 8.9–10.3)
Chloride: 101 mmol/L (ref 98–111)
Creatinine, Ser: 1.05 mg/dL (ref 0.61–1.24)
GFR calc Af Amer: >60 mL/min (ref >60)
GFR calc non Af Amer: >60 mL/min (ref >60)
Glucose, Bld: 114 mg/dL — ABNORMAL HIGH (ref 70–99)
Potassium: 3 mmol/L — ABNORMAL LOW (ref 3.5–5.1)
Sodium: 135 mmol/L (ref 135–145)
Total Bilirubin: 5.1 mg/dL — ABNORMAL HIGH (ref 0.3–1.2)
Total Protein: 5.4 g/dL — ABNORMAL LOW (ref 6.5–8.1)

## 2019-04-03 LAB — CYTOLOGY - NON PAP

## 2019-04-03 MED ORDER — POTASSIUM CHLORIDE 10 MEQ/100ML IV SOLN
10.0000 meq | INTRAVENOUS | Status: AC
  Administered 2019-04-03 (×2): 10 meq via INTRAVENOUS
  Filled 2019-04-03: qty 100

## 2019-04-03 MED ORDER — POTASSIUM CHLORIDE 10 MEQ/100ML IV SOLN
10.0000 meq | INTRAVENOUS | Status: AC
  Administered 2019-04-03 – 2019-04-04 (×2): 10 meq via INTRAVENOUS

## 2019-04-03 MED ORDER — POTASSIUM CHLORIDE CRYS ER 20 MEQ PO TBCR
40.0000 meq | EXTENDED_RELEASE_TABLET | Freq: Once | ORAL | Status: AC
  Administered 2019-04-03: 40 meq via ORAL
  Filled 2019-04-03: qty 2

## 2019-04-03 NOTE — Progress Notes (Signed)
[facility] Progress Note  Subjective: The patient states he feels good today.  No abdominal pain.  I reviewed findings of his ERCP.  I explained this to the patient.  I told him there was a tumor in the head of the pancreas and that we are awaiting final results of cytology.  Discussed with Dr. Paulita Fujita and he is going to ask surgery to see the patient for consideration of surgery in the event this is confirmed to be a malignancy.  Patient is hungry I will advance his diet  Objective: Vital signs in last 24 hours: Temp:  [97.6 F (36.4 C)-98.4 F (36.9 C)] 98 F (36.7 C) (12/04 0553) Pulse Rate:  [40-57] 47 (12/04 0553) Resp:  [15-23] 15 (12/04 0553) BP: (131-153)/(60-81) 132/71 (12/04 0553) SpO2:  [92 %-94 %] 94 % (12/04 0553) Weight change:    PE:  No distress  Heart regular rhythm  Abdomen soft nontender  Lab Results: No results found for this or any previous visit (from the past 24 hour(s)).  Studies/Results: Dg Ercp With Sphincterotomy  Result Date: 04/02/2019 CLINICAL DATA:  Obstructive jaundice. EXAM: ERCP TECHNIQUE: Multiple spot images obtained with the fluoroscopic device and submitted for interpretation post-procedure. FLUOROSCOPY TIME:  Fluoroscopy Time:  6 minutes, 15 seconds Number of Acquired Spot Images: 5 COMPARISON:  Abdominal CT 03/31/2019 FINDINGS: Common bile duct was cannulated and retrograde cholangiogram was performed. Dilated extrahepatic biliary system. Evidence for balloon dilatation in the distal common bile duct. Placement of a metallic stent in the common bile duct. Narrowing of the distal common bile duct stent due to an underlying lesion. IMPRESSION: 1. Dilated biliary system due to a distal common bile duct obstruction. 2. Placement of metallic biliary stent across the biliary obstruction. These images were submitted for radiologic interpretation only. Please see the procedural report for the amount of contrast and the fluoroscopy time  utilized. Electronically Signed   By: Markus Daft M.D.   On: 04/02/2019 15:35      Assessment: Pancreatic tumor, awaiting cytology  Obstructive jaundice biliary stent placed  Plan:   Dr. Paulita Fujita will call surgery.  Advance diet today.    Isaiah Patterson 04/03/2019, 11:51 AM  Pager: [phone] If no answer or after 5 PM call [phone]

## 2019-04-03 NOTE — Progress Notes (Signed)
PROGRESS NOTE    Isaiah Patterson  ZTI:[id number] DOB: 06-30-1945 DOA: 03/31/2019 PCP: Lennie Odor, PA-C   Brief Narrative:  73 year old white male with a past male history notable for worsening reflux, CAD status post CABG and MI who presents to the ER because of painless jaundice.  Patient reports been feeling poorly for approximately a month and was seen at the [facility] clinic in [place] on November 30 and had some lab work done.  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 -Notable 2.8x1.5 lesion with an obstructive pattern -CT did not identify vascular involvement -Presentation certainly concerning for pancreatic cancer versus endocrine tumor -GI consultation patient is status post ERCP with pancreatic and biliary stent placement as well as pancreatic mass biopsy on 04/02/2019; cytology is pending -Surgery has been contacted for possible Whipple procedure in the future but they will likely arrange for outpatient follow-up prior to this -Advance diet as tolerated per GI  Hypertension: -Continue home medications titrate as blood pressure tolerates  Hyperbilirubinemia/elevated alk phos/transaminitis -Consistent with obstructive pattern from mass-effect from the lesion -Improving significantly status post pancreatic and biliary stent placement -Continue to monitor LFTs  Hyperlipidemia: -Patient continue with statin controlled on discharge.  CAD: -No anginal symptomatology -Patient continue his home medications without acute change  DVT prophylaxis: Lovenox SQ  Code Status: full    Code Status Orders  (From admission, onward)          Start     Ordered   03/31/19 1918  Full code  Continuous     03/31/19 1919        Code Status History    This patient has a current code status but no historical code status.   Advance Care Planning Activity     Family Communication: none today  Disposition Plan:   Patient remain inpatient for observation post procedure Consults called: None Admission status: Inpatient   Consultants:   GI  Procedures:  Ct Abdomen Pelvis W Contrast  Result Date: 03/31/2019 CLINICAL DATA:  73 year old male with history of elevated liver function tests. Hyperbilirubinemia. Jaundice. Unintentional weight loss. EXAM: CT ABDOMEN AND PELVIS WITH CONTRAST TECHNIQUE: Multidetector CT imaging of the abdomen and pelvis was performed using the standard protocol following bolus administration of intravenous contrast. CONTRAST:  121m OMNIPAQUE IOHEXOL 300 MG/ML  SOLN COMPARISON:  No priors. FINDINGS: Lower chest: Mild scarring in the lung bases bilaterally. Aortic atherosclerosis. Status post median sternotomy for CABG. Hepatobiliary: Subcentimeter low-attenuation lesion in segment 5 of the liver, too small to characterize, but statistically likely to represent a cyst. No other suspicious hepatic lesions. Mild intra hepatic biliary ductal dilatation. Common bile duct is also markedly dilated measuring 17 mm in the porta hepatis. Common bile duct abruptly terminates in the region of the head of the pancreas where there is a poorly defined slightly hyperenhancing area estimated to measure approximately 2.8 x 1.5 cm (axial image 34 of series 3). Small calcified gallstones lying dependently in the gallbladder. No findings to suggest an acute cholecystitis at this time. Pancreas: Subtle hyperenhancing lesion in the superior aspect of the pancreatic head (axial image 34 of  series 3) measuring up to 2.8 x 1.5 cm. This mass is well separated from the superior mesenteric artery and vein, splenic vein, splenoportal  confluence and portal vein. Body and tail of the pancreas are normal in appearance. No pancreatic ductal dilatation. No peripancreatic fluid collections or inflammatory changes. Spleen: Unremarkable. Adrenals/Urinary Tract: 1.5 x 1.1 cm nodule in the medial limb of the left adrenal gland (axial image 21 of series 3), indeterminate. Bilateral kidneys and the right adrenal gland are normal in appearance. No hydroureteronephrosis. Urinary bladder is normal in appearance. Stomach/Bowel: Normal appearance of the stomach. No pathologic dilatation of small bowel or colon. Normal appendix. Vascular/Lymphatic: Aortic atherosclerosis, without evidence of aneurysm or dissection in the abdominal or pelvic vasculature. Vascular findings relevant to the potential pancreatic neoplasm, as detailed above. No lymphadenopathy noted in the abdomen or pelvis. Reproductive: Prostate gland and seminal vesicles are unremarkable in appearance. Other: No significant volume of ascites.  No pneumoperitoneum. Musculoskeletal: There are no aggressive appearing lytic or blastic lesions noted in the visualized portions of the skeleton. T12 compression fracture with 40% loss of anterior vertebral body height which appears chronic. IMPRESSION: 1. 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 At this time, this appears to be obstructing the common bile duct which is dilated measuring 17 mm in the porta hepatis. There is also mild intrahepatic biliary ductal dilatation. 2. 1.5 x 1.1 cm nodule in the medial limb of the left adrenal gland, indeterminate. The possibility of a metastatic lesion should be considered, and close attention on any future follow-up imaging is recommended to ensure stability. 3. Cholelithiasis without evidence of acute cholecystitis at this time. 4. Aortic atherosclerosis.   Status post median sternotomy for CABG. 5. Additional incidental findings, as above. Electronically Signed   By: Vinnie Langton M.D.   On: 03/31/2019 18:04   Dg Ercp With Sphincterotomy  Result Date: 04/02/2019 CLINICAL DATA:  Obstructive jaundice. EXAM: ERCP TECHNIQUE: Multiple spot images obtained with the fluoroscopic device and submitted for interpretation post-procedure. FLUOROSCOPY TIME:  Fluoroscopy Time:  6 minutes, 15 seconds Number of Acquired Spot Images: 5 COMPARISON:  Abdominal CT 03/31/2019 FINDINGS: Common bile duct was cannulated and retrograde cholangiogram was performed. Dilated extrahepatic biliary system. Evidence for balloon dilatation in the distal common bile duct. Placement of a metallic stent in the common bile duct. Narrowing of the distal common bile duct stent due to an underlying lesion. IMPRESSION: 1. Dilated biliary system due to a distal common bile duct obstruction. 2. Placement of metallic biliary stent across the biliary obstruction. These images were submitted for radiologic interpretation only. Please see the procedural report for the amount of contrast and the fluoroscopy time utilized. Electronically Signed   By: Markus Daft M.D.   On: 04/02/2019 15:35    Antimicrobials:   none    Subjective: Patient denies any nausea, vomiting, diarrhea.  He states that his urine is lighter in color now.  He feels his skin is not as yellow anymore either.  Objective: Vitals:   04/02/19 1853 04/02/19 2354 04/03/19 0553 04/03/19 1224  BP: (!) 153/81 (!) 146/80 132/71 127/72  Pulse: (!) 57 (!) 55 (!) 47 60  Resp: '20 16 15 16  ' Temp: 98.3 F (36.8 C) 97.6 F (36.4 C) 98 F (36.7 C) 97.6 F (36.4 C)  TempSrc: Oral Oral Oral Oral  SpO2: 94% 93% 94% 97%  Weight:  Height:        Intake/Output Summary (Last 24 hours) at 04/03/2019 1418 Last data filed at 04/02/2019 1745 Gross per 24 hour  Intake 840 ml  Output 225 ml  Net 615 ml   Filed Weights   03/31/19 2259  04/02/19 0848  Weight: 90.8 kg 91 kg    Examination:  General exam: Appears calm and comfortable Respiratory system: Clear to auscultation. Respiratory effort normal. Cardiovascular system: S1 & S2 heard, RRR. No JVD, murmurs, rubs, gallops or clicks. No pedal edema. Gastrointestinal system: Abdomen is nondistended, soft and nontender. No organomegaly or masses felt. Normal bowel sounds heard. Central nervous system: Alert and oriented. No focal neurological deficits. Extremities: WWP, neurovasc intact Skin: No rashes, lesions or ulcers, last jaundiced than prior Psychiatry: Judgement and insight appear normal. Mood & affect appropriateappropriate.     Data Reviewed: I have personally reviewed following labs and imaging studies  CBC: Recent Labs  Lab 03/31/19 1332  WBC 7.4  HGB 13.2  HCT 38.8*  MCV 94.2  PLT 712   Basic Metabolic Panel: Recent Labs  Lab 03/31/19 1332 04/03/19 1119  NA 139 135  K 4.3 3.0*  CL 104 101  CO2 25 25  GLUCOSE 117* 114*  BUN 8 8  CREATININE 0.90 1.05  CALCIUM 9.2 8.5*   GFR: Estimated Creatinine Clearance: 70 mL/min (by C-G formula based on SCr of 1.05 mg/dL). Liver Function Tests: Recent Labs  Lab 03/31/19 1332 04/03/19 1119  AST 411* 318*  ALT 324* 265*  ALKPHOS 1,166* 921*  BILITOT 14.1* 5.1*  PROT 6.9 5.4*  ALBUMIN 3.0* 2.6*   Recent Labs  Lab 03/31/19 1332  LIPASE 65*   No results for input(s): AMMONIA in the last 168 hours. Coagulation Profile: Recent Labs  Lab 03/31/19 1630  INR 1.1   Cardiac Enzymes: No results for input(s): CKTOTAL, CKMB, CKMBINDEX, TROPONINI in the last 168 hours. BNP (last 3 results) No results for input(s): PROBNP in the last 8760 hours. HbA1C: No results for input(s): HGBA1C in the last 72 hours. CBG: No results for input(s): GLUCAP in the last 168 hours. Lipid Profile: No results for input(s): CHOL, HDL, LDLCALC, TRIG, CHOLHDL, LDLDIRECT in the last 72 hours. Thyroid Function  Tests: No results for input(s): TSH, T4TOTAL, FREET4, T3FREE, THYROIDAB in the last 72 hours. Anemia Panel: No results for input(s): VITAMINB12, FOLATE, FERRITIN, TIBC, IRON, RETICCTPCT in the last 72 hours. Sepsis Labs: No results for input(s): PROCALCITON, LATICACIDVEN in the last 168 hours.  Recent Results (from the past 240 hour(s))  SARS CORONAVIRUS 2 (TAT 6-24 HRS) Nasopharyngeal Nasopharyngeal Swab     Status: None   Collection Time: 03/31/19 10:19 PM   Specimen: Nasopharyngeal Swab  Result Value Ref Range Status   SARS Coronavirus 2 NEGATIVE NEGATIVE Final    Comment: (NOTE) SARS-CoV-2 target nucleic acids are NOT DETECTED. The SARS-CoV-2 RNA is generally detectable in upper and lower respiratory specimens during the acute phase of infection. Negative results do not preclude SARS-CoV-2 infection, do not rule out co-infections with other pathogens, and should not be used as the sole basis for treatment or other patient management decisions. Negative results must be combined with clinical observations, patient history, and epidemiological information. The expected result is Negative. Fact Sheet for Patients: SugarRoll.be Fact Sheet for Healthcare Providers: https://www.woods-mathews.com/ This test is not yet approved or cleared by the [place] FDA and  has been authorized for detection and/or diagnosis of SARS-CoV-2 by FDA under an Emergency Use Authorization (  EUA). This EUA will remain  in effect (meaning this test can be used) for the duration of the COVID-19 declaration under Section 56 4(b)(1) of the Act, 21 U.S.C. section 360bbb-3(b)(1), unless the authorization is terminated or revoked sooner. Performed at [facility], [place] [address] [place], [place] [address]          Radiology Studies: Dg Ercp With Sphincterotomy  Result Date: 04/02/2019 CLINICAL DATA:  Obstructive jaundice. EXAM: ERCP TECHNIQUE:  Multiple spot images obtained with the fluoroscopic device and submitted for interpretation post-procedure. FLUOROSCOPY TIME:  Fluoroscopy Time:  6 minutes, 15 seconds Number of Acquired Spot Images: 5 COMPARISON:  Abdominal CT 03/31/2019 FINDINGS: Common bile duct was cannulated and retrograde cholangiogram was performed. Dilated extrahepatic biliary system. Evidence for balloon dilatation in the distal common bile duct. Placement of a metallic stent in the common bile duct. Narrowing of the distal common bile duct stent due to an underlying lesion. IMPRESSION: 1. Dilated biliary system due to a distal common bile duct obstruction. 2. Placement of metallic biliary stent across the biliary obstruction. These images were submitted for radiologic interpretation only. Please see the procedural report for the amount of contrast and the fluoroscopy time utilized. Electronically Signed   By: Markus Daft M.D.   On: 04/02/2019 15:35        Scheduled Meds:  enoxaparin (LOVENOX) injection  40 mg Subcutaneous Q24H   isosorbide mononitrate  30 mg Oral Daily   lisinopril  20 mg Oral Daily   pantoprazole  40 mg Oral Daily   senna  1 tablet Oral BID   sertraline  200 mg Oral Daily   Continuous Infusions:  dextrose 5 % and 0.45% NaCl 50 mL/hr at 04/01/19 1733     LOS: 3 days    Time spent: 79 min    Charolotte Capuchin, MD [facility]  If 7PM-7AM, please contact night-coverage  04/03/2019, 2:18 PM

## 2019-04-03 NOTE — Plan of Care (Signed)
  Problem: Clinical Measurements: Goal: Ability to maintain clinical measurements within normal limits will improve Outcome: Progressing Goal: Will remain free from infection Outcome: Progressing Goal: Diagnostic test results will improve Outcome: Progressing Goal: Respiratory complications will improve Outcome: Progressing Goal: Cardiovascular complication will be avoided Outcome: Progressing   Problem: Pain Managment: Goal: General experience of comfort will improve Outcome: Progressing   Problem: Safety: Goal: Ability to remain free from injury will improve Outcome: Progressing   Problem: Skin Integrity: Goal: Risk for impaired skin integrity will decrease Outcome: Progressing   

## 2019-04-04 DIAGNOSIS — K831 Obstruction of bile duct: Secondary | ICD-10-CM

## 2019-04-04 LAB — COMPREHENSIVE METABOLIC PANEL
ALT: 216 U/L — ABNORMAL HIGH (ref 0–44)
AST: 215 U/L — ABNORMAL HIGH (ref 15–41)
Albumin: 2.6 g/dL — ABNORMAL LOW (ref 3.5–5.0)
Alkaline Phosphatase: 838 U/L — ABNORMAL HIGH (ref 38–126)
Anion gap: 8 (ref 5–15)
BUN: 11 mg/dL (ref 8–23)
CO2: 23 mmol/L (ref 22–32)
Calcium: 8.7 mg/dL — ABNORMAL LOW (ref 8.9–10.3)
Chloride: 106 mmol/L (ref 98–111)
Creatinine, Ser: 0.93 mg/dL (ref 0.61–1.24)
GFR calc Af Amer: >60 mL/min (ref >60)
GFR calc non Af Amer: >60 mL/min (ref >60)
Glucose, Bld: 166 mg/dL — ABNORMAL HIGH (ref 70–99)
Potassium: 3.4 mmol/L — ABNORMAL LOW (ref 3.5–5.1)
Sodium: 137 mmol/L (ref 135–145)
Total Bilirubin: 4.1 mg/dL — ABNORMAL HIGH (ref 0.3–1.2)
Total Protein: 5.6 g/dL — ABNORMAL LOW (ref 6.5–8.1)

## 2019-04-04 MED ORDER — TRAMADOL HCL 50 MG PO TABS: 50.0000 mg | ORAL_TABLET | Freq: Four times a day (QID) | ORAL | 0 refills | Status: DC | PRN

## 2019-04-04 NOTE — Plan of Care (Signed)

## 2019-04-04 NOTE — Progress Notes (Signed)
PROGRESS NOTE    Isaiah Patterson  [id number] DOB: [birth year] DOA: 03/31/2019 PCP: Lennie Odor, PA-C     Brief Narrative:  73 year old man initially admitted to the hospital on 12/1 due to painless jaundice.  He had been feeling poorly for approximately a month and finally went to the [place] in [place] on November 30 and had some lab work done.  Was found to have an elevated bilirubin of 14 and was subsequently sent to the ER for further evaluation and treatment.  His past medical history is significant for GERD, coronary artery disease status post CABG and MI.   Assessment & Plan:   Active Problems:   HTN (hypertension)   Painless jaundice   Pancreatic lesion   Hyperbilirubinemia   Transaminitis   Elevated alkaline phosphatase level   Pancreatic lesion in the head of the pancreas with painless jaundice -He had a notable 2.8x1.5 lesion with an obstructive pattern on his LFTs. -Presentation is certainly concerning for pancreatic cancer. -GI was consulted and he is now status post ERCP with biliary stent placement and biopsy on 12/3.  Pathology results remain pending. -Surgery has been contacted and will see patient in the outpatient setting. -He is tolerating diet. -Unfortunately labs were not drawn today.  Will order BMP for today and tomorrow to follow LFTs. -If LFTs trending downward and okay with GI, anticipate discharge home in a.m.  Hypertension -Systolic blood pressure remains in the 140s to 150s this admission. -He remains on home medications which include lisinopril 20 mg, isosorbide 30 mg. -See no need to increase medications while inpatient.  Hyperbilirubinemia/transaminitis -Stemming from principal problem, see above for details. -Labs ordered for today and tomorrow to follow.  Hyperlipidemia -At home he is on Lipitor 80 mg, statin remains on hold this admission due to transaminitis.  History of coronary artery disease -No anginal symptoms. -Continue  home medications.   DVT prophylaxis: Lovenox Code Status: Full code Family Communication: Patient only Disposition Plan: Likely discharge home in a.m. if LFTs continue to improve and okay with GI.  Plan for surgical evaluation in the outpatient setting.  Consultants:   GI, Dr. Narda Amber  Procedures:   ERCP on 12/3 with biopsy and stent placement  Antimicrobials:  Anti-infectives (From admission, onward)   Start     Dose/Rate Route Frequency Ordered Stop   04/02/19 0830  ciprofloxacin (CIPRO) IVPB 400 mg     400 mg 200 mL/hr over 60 Minutes Intravenous To Short Stay 04/02/19 [id number] 04/02/19 1132       Subjective: He is feeling well today, is in good spirits, has only minimal abdominal pain and is tolerating diet without incident.  Objective: Vitals:   04/03/19 1224 04/03/19 1819 04/03/19 2351 04/04/19 0520  BP: 127/72 (!) 141/68 (!) 152/73 (!) 159/80  Pulse: 60 67 88 (!) 53  Resp: 16 16 17 17   Temp: 97.6 F (36.4 C) 97.9 F (36.6 C) 99.1 F (37.3 C) 98.4 F (36.9 C)  TempSrc: Oral Oral Oral Oral  SpO2: 97% 95% 94% 98%  Weight:      Height:        Intake/Output Summary (Last 24 hours) at 04/04/2019 0857 Last data filed at 04/04/2019 0530 Gross per 24 hour  Intake -  Output 1000 ml  Net -1000 ml   Filed Weights   03/31/19 2259 04/02/19 0848  Weight: 90.8 kg 91 kg    Examination:  General exam: Alert, awake, oriented x 3 Respiratory system: Clear to auscultation. Respiratory effort normal.  Cardiovascular system:RRR. No murmurs, rubs, gallops. Gastrointestinal system: Abdomen is nondistended, soft and only mildly tender to palpation. Normal bowel sounds heard. Central nervous system: Alert and oriented. No focal neurological deficits. Extremities: No C/C/E, +pedal pulses Skin: No rashes, lesions or ulcers Psychiatry: Judgement and insight appear normal. Mood & affect appropriate.     Data Reviewed: I have personally reviewed following labs and imaging  studies  CBC: Recent Labs  Lab 03/31/19 1332  WBC 7.4  HGB 13.2  HCT 38.8*  MCV 94.2  PLT 99991111   Basic Metabolic Panel: Recent Labs  Lab 03/31/19 1332 04/03/19 1119  NA 139 135  K 4.3 3.0*  CL 104 101  CO2 25 25  GLUCOSE 117* 114*  BUN 8 8  CREATININE 0.90 1.05  CALCIUM 9.2 8.5*   GFR: Estimated Creatinine Clearance: 70 mL/min (by C-G formula based on SCr of 1.05 mg/dL). Liver Function Tests: Recent Labs  Lab 03/31/19 1332 04/03/19 1119  AST 411* 318*  ALT 324* 265*  ALKPHOS 1,166* 921*  BILITOT 14.1* 5.1*  PROT 6.9 5.4*  ALBUMIN 3.0* 2.6*   Recent Labs  Lab 03/31/19 1332  LIPASE 65*   No results for input(s): AMMONIA in the last 168 hours. Coagulation Profile: Recent Labs  Lab 03/31/19 1630  INR 1.1   Cardiac Enzymes: No results for input(s): CKTOTAL, CKMB, CKMBINDEX, TROPONINI in the last 168 hours. BNP (last 3 results) No results for input(s): PROBNP in the last 8760 hours. HbA1C: No results for input(s): HGBA1C in the last 72 hours. CBG: No results for input(s): GLUCAP in the last 168 hours. Lipid Profile: No results for input(s): CHOL, HDL, LDLCALC, TRIG, CHOLHDL, LDLDIRECT in the last 72 hours. Thyroid Function Tests: No results for input(s): TSH, T4TOTAL, FREET4, T3FREE, THYROIDAB in the last 72 hours. Anemia Panel: No results for input(s): VITAMINB12, FOLATE, FERRITIN, TIBC, IRON, RETICCTPCT in the last 72 hours. Urine analysis:    Component Value Date/Time   COLORURINE AMBER (A) 03/31/2019 1333   APPEARANCEUR CLEAR 03/31/2019 1333   LABSPEC 1.012 03/31/2019 1333   PHURINE 5.0 03/31/2019 1333   GLUCOSEU NEGATIVE 03/31/2019 1333   HGBUR SMALL (A) 03/31/2019 1333   BILIRUBINUR MODERATE (A) 03/31/2019 1333   KETONESUR NEGATIVE 03/31/2019 1333   PROTEINUR NEGATIVE 03/31/2019 1333   NITRITE NEGATIVE 03/31/2019 1333   LEUKOCYTESUR NEGATIVE 03/31/2019 1333   Sepsis Labs: @LABRCNTIP (procalcitonin:4,lacticidven:4)  ) Recent Results  (from the past 240 hour(s))  SARS CORONAVIRUS 2 (TAT 6-24 HRS) Nasopharyngeal Nasopharyngeal Swab     Status: None   Collection Time: 03/31/19 10:19 PM   Specimen: Nasopharyngeal Swab  Result Value Ref Range Status   SARS Coronavirus 2 NEGATIVE NEGATIVE Final    Comment: (NOTE) SARS-CoV-2 target nucleic acids are NOT DETECTED. The SARS-CoV-2 RNA is generally detectable in upper and lower respiratory specimens during the acute phase of infection. Negative results do not preclude SARS-CoV-2 infection, do not rule out co-infections with other pathogens, and should not be used as the sole basis for treatment or other patient management decisions. Negative results must be combined with clinical observations, patient history, and epidemiological information. The expected result is Negative. Fact Sheet for Patients: SugarRoll.be Fact Sheet for Healthcare Providers: https://www.woods-mathews.com/ This test is not yet approved or cleared by the [place] FDA and  has been authorized for detection and/or diagnosis of SARS-CoV-2 by FDA under an Emergency Use Authorization (EUA). This EUA will remain  in effect (meaning this test can be used) for the duration of  the COVID-19 declaration under Section 56 4(b)(1) of the Act, 21 U.S.C. section 360bbb-3(b)(1), unless the authorization is terminated or revoked sooner. Performed at [facility], [place] [address], [place], [place] [address]          Radiology Studies: Dg Ercp With Sphincterotomy  Result Date: 04/02/2019 CLINICAL DATA:  Obstructive jaundice. EXAM: ERCP TECHNIQUE: Multiple spot images obtained with the fluoroscopic device and submitted for interpretation post-procedure. FLUOROSCOPY TIME:  Fluoroscopy Time:  6 minutes, 15 seconds Number of Acquired Spot Images: 5 COMPARISON:  Abdominal CT 03/31/2019 FINDINGS: Common bile duct was cannulated and retrograde cholangiogram was performed.  Dilated extrahepatic biliary system. Evidence for balloon dilatation in the distal common bile duct. Placement of a metallic stent in the common bile duct. Narrowing of the distal common bile duct stent due to an underlying lesion. IMPRESSION: 1. Dilated biliary system due to a distal common bile duct obstruction. 2. Placement of metallic biliary stent across the biliary obstruction. These images were submitted for radiologic interpretation only. Please see the procedural report for the amount of contrast and the fluoroscopy time utilized. Electronically Signed   By: Markus Daft M.D.   On: 04/02/2019 15:35        Scheduled Meds: . enoxaparin (LOVENOX) injection  40 mg Subcutaneous Q24H  . isosorbide mononitrate  30 mg Oral Daily  . lisinopril  20 mg Oral Daily  . pantoprazole  40 mg Oral Daily  . senna  1 tablet Oral BID  . sertraline  200 mg Oral Daily   Continuous Infusions: . dextrose 5 % and 0.45% NaCl 50 mL/hr at 04/01/19 1733     LOS: 4 days    Time spent: 35 minutes. Greater than 50% of this time was spent in direct contact with the patient, coordinating care and discussing relevant ongoing clinical issues.     Lelon Frohlich, MD [facility] Pager [phone]  If 7PM-7AM, please contact night-coverage [email or web address] Password [email or web address] 04/04/2019, 8:57 AM

## 2019-04-04 NOTE — Progress Notes (Signed)
Subjective: No abdominal pain. Less jaundice.  Objective: Vital signs in last 24 hours: Temp:  [97.6 F (36.4 C)-99.1 F (37.3 C)] 98.4 F (36.9 C) (12/05 0520) Pulse Rate:  [53-88] 53 (12/05 0520) Resp:  [16-17] 17 (12/05 0520) BP: (127-159)/(68-80) 159/80 (12/05 0520) SpO2:  [94 %-98 %] 98 % (12/05 0520) Weight change:  Last BM Date: 04/02/19  PE: GEN:  NAD ABD:  Soft, non-tender  Lab Results: CBC    Component Value Date/Time   WBC 7.4 03/31/2019 1332   RBC 4.12 (L) 03/31/2019 1332   HGB 13.2 03/31/2019 1332   HCT 38.8 (L) 03/31/2019 1332   PLT 191 03/31/2019 1332   MCV 94.2 03/31/2019 1332   MCH 32.0 03/31/2019 1332   MCHC 34.0 03/31/2019 1332   RDW 16.6 (H) 03/31/2019 1332   LYMPHSABS 2.0 02/03/2013 1414   MONOABS 0.4 02/03/2013 1414   EOSABS 0.3 02/03/2013 1414   BASOSABS 0.1 02/03/2013 1414   CMP     Component Value Date/Time   NA 137 04/04/2019 0917   K 3.4 (L) 04/04/2019 0917   CL 106 04/04/2019 0917   CO2 23 04/04/2019 0917   GLUCOSE 166 (H) 04/04/2019 0917   BUN 11 04/04/2019 0917   CREATININE 0.93 04/04/2019 0917   CALCIUM 8.7 (L) 04/04/2019 0917   PROT 5.6 (L) 04/04/2019 0917   ALBUMIN 2.6 (L) 04/04/2019 0917   AST 215 (H) 04/04/2019 0917   ALT 216 (H) 04/04/2019 0917   ALKPHOS 838 (H) 04/04/2019 0917   BILITOT 4.1 (H) 04/04/2019 0917   GFRNONAA >60 04/04/2019 0917   GFRAA >60 04/04/2019 0917   Assessment:  1.  Obstructive jaundice. 2.  Pancreatic mass.  Pathology + adenocarcinoma. 3.  Elevated LFTs, downtrending after biliary stent placement.  Plan:  1.  OK to discharge home today from GI perspective. 2.  He will need outpatient surgical consultation (patient to think whether locally or at tertiary center). 3.  We will be in touch with patient early next week to make surgical referral and get follow-up outpatient liver enzymes. 4.  Case discussed extensively with patient (in person) and his wife (phone), and they are in agreement with  current plan of action. 5.  [facility] will sign-off; please call with questions; thank you for the consultation.   Landry Dyke 04/04/2019, 11:43 AM   Cell [phone] If no answer or after 5 PM call [phone]

## 2019-04-04 NOTE — Discharge Summary (Signed)
Physician Discharge Summary  Isaiah Patterson [id number] DOB: Mar 12, 1946 DOA: 03/31/2019  PCP: Lennie Odor, PA-C  Admit date: 03/31/2019 Discharge date: 04/04/2019  Time spent: 45 minutes  Recommendations for Outpatient Follow-up:  -To be discharged home today. -Advised follow-up LFTs at time of hospital follow-up.  Discharge Diagnoses:  Active Problems:   HTN (hypertension)   Painless jaundice   Pancreatic lesion   Hyperbilirubinemia   Transaminitis   Elevated alkaline phosphatase level   Discharge Condition: Stable  Filed Weights   03/31/19 2259 04/02/19 0848  Weight: 90.8 kg 91 kg    History of present illness:  As per Dr. Linda Hedges on 12/1: Isaiah Patterson is a 73 y.o. male with medical history significant of  acid reflux, CAD s/p CABG and MI presents to the ER for evaluation of orange urine and diffuse painless jaundice. He reports that he has not been feeling well for there last month Patient was seen at [facility] clinic 11/30 and had blood work done. They called him and told him his labs were abnormal and to go to the ER. Reports [place] history of "acid reflux" for most a year describes it as abdominal discomfort that has become constant but usually worsens after meals associated with increased belching, bloating, regurgitation, nausea. Several weeks ago his stool was tan-colored. He has had to change his diet and now mostly eating soups because anything else will exacerbate his acid reflux symptoms. He has been compliant with famotidine and Protonix without any relief. He does endorse moderate epigastric pain with radiation to mid-back. Reports 7 pound weight loss but states he has been trying by watching his diet. No history of abdominal surgeries. He denies fever, vomiting, chest pain. No dysuria, urinary urgency or frequency, flank pain. Denies alcohol use or frequent Tylenol use. No history of ulcers.   Hospital Course:   Pancreatic lesion in the  head of the pancreas with painless jaundice -He had a notable 2.8x1.5 lesion with an obstructive pattern on his LFTs. -GI was consulted and he is now status post ERCP with biliary stent placement and biopsy on 12/3.  Pathology results confirmed pancreatic adenocarcinoma -Surgery has been contacted and will see patient in the outpatient setting. -He is tolerating diet. -Labs show downward trend in LFTs after biliary stent placement  Hypertension -Systolic blood pressure remains in the 140s to 150s this admission. -He remains on home medications which include lisinopril 20 mg, isosorbide 30 mg. -Continue outpatient follow-up  Hyperbilirubinemia/transaminitis -Improved LFTs status post biliary stent placement  Hyperlipidemia -At home he is on Lipitor 80 mg, statin remains on hold due to transaminitis.  History of coronary artery disease -No anginal symptoms. -Continue home medications.   Procedures:  ERCP on 12/3 with pancreatic biopsy and stent placement.  Consultations:  GI Dr. Penelope Coop and Dr. Paulita Fujita  Discharge Instructions  Discharge Instructions    Diet - low sodium heart healthy   Complete by: As directed    Increase activity slowly   Complete by: As directed      Allergies as of 04/04/2019   No Known Allergies     Medication List    STOP taking these medications   atorvastatin 80 MG tablet Commonly known as: LIPITOR   ferrous gluconate 324 MG tablet Commonly known as: FERGON     TAKE these medications   aspirin EC 81 MG tablet Take 1 tablet (81 mg total) by mouth daily.   famotidine 20 MG tablet Commonly known as: PEPCID Take  20 mg by mouth 2 (two) times daily.   isosorbide mononitrate 30 MG 24 hr tablet Commonly known as: Imdur Take 1 tablet (30 mg total) by mouth daily.   lisinopril 20 MG tablet Commonly known as: ZESTRIL Take 20 mg by mouth daily.   multivitamin with minerals Tabs tablet Take 1 tablet by mouth daily.   nicotine 21  mg/24hr patch Commonly known as: NICODERM CQ - dosed in mg/24 hours Place 21 mg onto the skin daily as needed (smoking cession).   nitroGLYCERIN 0.4 MG SL tablet Commonly known as: Nitrostat Place 1 tablet (0.4 mg total) under the tongue every 5 (five) minutes as needed for chest pain.   omeprazole 40 MG capsule Commonly known as: PRILOSEC Take 40 mg by mouth daily.   pantoprazole 40 MG tablet Commonly known as: PROTONIX Take 40 mg by mouth daily.   sertraline 100 MG tablet Commonly known as: ZOLOFT Take 200 mg by mouth daily.   traMADol 50 MG tablet Commonly known as: ULTRAM Take 1 tablet (50 mg total) by mouth every 6 (six) hours as needed for moderate pain.      No Known Allergies    The results of significant diagnostics from this hospitalization (including imaging, microbiology, ancillary and laboratory) are listed below for reference.    Significant Diagnostic Studies: Ct Abdomen Pelvis W Contrast  Result Date: 03/31/2019 CLINICAL DATA:  73 year old male with history of elevated liver function tests. Hyperbilirubinemia. Jaundice. Unintentional weight loss. EXAM: CT ABDOMEN AND PELVIS WITH CONTRAST TECHNIQUE: Multidetector CT imaging of the abdomen and pelvis was performed using the standard protocol following bolus administration of intravenous contrast. CONTRAST:  123mL OMNIPAQUE IOHEXOL 300 MG/ML  SOLN COMPARISON:  No priors. FINDINGS: Lower chest: Mild scarring in the lung bases bilaterally. Aortic atherosclerosis. Status post median sternotomy for CABG. Hepatobiliary: Subcentimeter low-attenuation lesion in segment 5 of the liver, too small to characterize, but statistically likely to represent a cyst. No other suspicious hepatic lesions. Mild intra hepatic biliary ductal dilatation. Common bile duct is also markedly dilated measuring 17 mm in the porta hepatis. Common bile duct abruptly terminates in the region of the head of the pancreas where there is a poorly defined  slightly hyperenhancing area estimated to measure approximately 2.8 x 1.5 cm (axial image 34 of series 3). Small calcified gallstones lying dependently in the gallbladder. No findings to suggest an acute cholecystitis at this time. Pancreas: Subtle hyperenhancing lesion in the superior aspect of the pancreatic head (axial image 34 of series 3) measuring up to 2.8 x 1.5 cm. This mass is well separated from the superior mesenteric artery and vein, splenic vein, splenoportal confluence and portal vein. Body and tail of the pancreas are normal in appearance. No pancreatic ductal dilatation. No peripancreatic fluid collections or inflammatory changes. Spleen: Unremarkable. Adrenals/Urinary Tract: 1.5 x 1.1 cm nodule in the medial limb of the left adrenal gland (axial image 21 of series 3), indeterminate. Bilateral kidneys and the right adrenal gland are normal in appearance. No hydroureteronephrosis. Urinary bladder is normal in appearance. Stomach/Bowel: Normal appearance of the stomach. No pathologic dilatation of small bowel or colon. Normal appendix. Vascular/Lymphatic: Aortic atherosclerosis, without evidence of aneurysm or dissection in the abdominal or pelvic vasculature. Vascular findings relevant to the potential pancreatic neoplasm, as detailed above. No lymphadenopathy noted in the abdomen or pelvis. Reproductive: Prostate gland and seminal vesicles are unremarkable in appearance. Other: No significant volume of ascites.  No pneumoperitoneum. Musculoskeletal: There are no aggressive appearing lytic or  blastic lesions noted in the visualized portions of the skeleton. T12 compression fracture with 40% loss of anterior vertebral body height which appears chronic. IMPRESSION: 1. 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 At this time, this appears to be  obstructing the common bile duct which is dilated measuring 17 mm in the porta hepatis. There is also mild intrahepatic biliary ductal dilatation. 2. 1.5 x 1.1 cm nodule in the medial limb of the left adrenal gland, indeterminate. The possibility of a metastatic lesion should be considered, and close attention on any future follow-up imaging is recommended to ensure stability. 3. Cholelithiasis without evidence of acute cholecystitis at this time. 4. Aortic atherosclerosis.  Status post median sternotomy for CABG. 5. Additional incidental findings, as above. Electronically Signed   By: Vinnie Langton M.D.   On: 03/31/2019 18:04   Dg Ercp With Sphincterotomy  Result Date: 04/02/2019 CLINICAL DATA:  Obstructive jaundice. EXAM: ERCP TECHNIQUE: Multiple spot images obtained with the fluoroscopic device and submitted for interpretation post-procedure. FLUOROSCOPY TIME:  Fluoroscopy Time:  6 minutes, 15 seconds Number of Acquired Spot Images: 5 COMPARISON:  Abdominal CT 03/31/2019 FINDINGS: Common bile duct was cannulated and retrograde cholangiogram was performed. Dilated extrahepatic biliary system. Evidence for balloon dilatation in the distal common bile duct. Placement of a metallic stent in the common bile duct. Narrowing of the distal common bile duct stent due to an underlying lesion. IMPRESSION: 1. Dilated biliary system due to a distal common bile duct obstruction. 2. Placement of metallic biliary stent across the biliary obstruction. These images were submitted for radiologic interpretation only. Please see the procedural report for the amount of contrast and the fluoroscopy time utilized. Electronically Signed   By: Markus Daft M.D.   On: 04/02/2019 15:35    Microbiology: Recent Results (from the past 240 hour(s))  SARS CORONAVIRUS 2 (TAT 6-24 HRS) Nasopharyngeal Nasopharyngeal Swab     Status: None   Collection Time: 03/31/19 10:19 PM   Specimen: Nasopharyngeal Swab  Result Value Ref Range Status    SARS Coronavirus 2 NEGATIVE NEGATIVE Final    Comment: (NOTE) SARS-CoV-2 target nucleic acids are NOT DETECTED. The SARS-CoV-2 RNA is generally detectable in upper and lower respiratory specimens during the acute phase of infection. Negative results do not preclude SARS-CoV-2 infection, do not rule out co-infections with other pathogens, and should not be used as the sole basis for treatment or other patient management decisions. Negative results must be combined with clinical observations, patient history, and epidemiological information. The expected result is Negative. Fact Sheet for Patients: SugarRoll.be Fact Sheet for Healthcare Providers: https://www.woods-mathews.com/ This test is not yet approved or cleared by the [place] FDA and  has been authorized for detection and/or diagnosis of SARS-CoV-2 by FDA under an Emergency Use Authorization (EUA). This EUA will remain  in effect (meaning this test can be used) for the duration of the COVID-19 declaration under Section 56 4(b)(1) of the Act, 21 U.S.C. section 360bbb-3(b)(1), unless the authorization is terminated or revoked sooner. Performed at [facility], [place] [address], [place], [place] [address]      Labs: Basic Metabolic Panel: Recent Labs  Lab 03/31/19 1332 04/03/19 1119 04/04/19 0917  NA 139 135 137  K 4.3 3.0* 3.4*  CL 104 101 106  CO2 25 25 23   GLUCOSE 117* 114* 166*  BUN 8 8 11  CREATININE 0.90 1.05 0.93  CALCIUM 9.2 8.5* 8.7*   Liver Function Tests: Recent Labs  Lab 03/31/19 1332 04/03/19 1119 04/04/19 0917  AST 411* 318* 215*  ALT 324* 265* 216*  ALKPHOS 1,166* 921* 838*  BILITOT 14.1* 5.1* 4.1*  PROT 6.9 5.4* 5.6*  ALBUMIN 3.0* 2.6* 2.6*   Recent Labs  Lab 03/31/19 1332  LIPASE 65*   No results for input(s): AMMONIA in the last 168 hours. CBC: Recent Labs  Lab 03/31/19 1332  WBC 7.4  HGB 13.2  HCT 38.8*  MCV 94.2  PLT 191    Cardiac Enzymes: No results for input(s): CKTOTAL, CKMB, CKMBINDEX, TROPONINI in the last 168 hours. BNP: BNP (last 3 results) No results for input(s): BNP in the last 8760 hours.  ProBNP (last 3 results) No results for input(s): PROBNP in the last 8760 hours.  CBG: No results for input(s): GLUCAP in the last 168 hours.     Signed:  Lelon Frohlich  [facility] Pager: [phone] 04/04/2019, 12:08 PM

## 2019-12-26 ENCOUNTER — Encounter (HOSPITAL_COMMUNITY): Payer: Self-pay | Admitting: Emergency Medicine

## 2019-12-26 ENCOUNTER — Other Ambulatory Visit: Payer: Self-pay

## 2019-12-26 ENCOUNTER — Emergency Department (HOSPITAL_COMMUNITY)
Admission: EM | Admit: 2019-12-26 | Discharge: 2019-12-27 | Disposition: A | Discharge status: Home / Self Care | Payer: No Typology Code available for payment source | Attending: Emergency Medicine | Admitting: Emergency Medicine

## 2019-12-26 ENCOUNTER — Emergency Department (HOSPITAL_COMMUNITY): Payer: No Typology Code available for payment source

## 2019-12-26 DIAGNOSIS — R531 Weakness: Secondary | ICD-10-CM | POA: Insufficient documentation

## 2019-12-26 DIAGNOSIS — R0789 Other chest pain: Secondary | ICD-10-CM | POA: Diagnosis not present

## 2019-12-26 DIAGNOSIS — Z5321 Procedure and treatment not carried out due to patient leaving prior to being seen by health care provider: Secondary | ICD-10-CM | POA: Diagnosis not present

## 2019-12-26 HISTORY — DX: Malignant (primary) neoplasm, unspecified: C80.1

## 2019-12-26 LAB — CBG MONITORING, ED: Glucose-Capillary: 101 mg/dL — ABNORMAL HIGH (ref 70–99)

## 2019-12-26 LAB — CBC WITH DIFFERENTIAL/PLATELET
Abs Immature Granulocytes: 0 10*3/uL (ref 0.00–0.07)
Basophils Absolute: 0 10*3/uL (ref 0.0–0.1)
Basophils Relative: 1 %
Eosinophils Absolute: 0.2 10*3/uL (ref 0.0–0.5)
Eosinophils Relative: 3 %
HCT: 33 % — ABNORMAL LOW (ref 39.0–52.0)
Hemoglobin: 10.6 g/dL — ABNORMAL LOW (ref 13.0–17.0)
Immature Granulocytes: 0 %
Lymphocytes Relative: 49 %
Lymphs Abs: 2.3 10*3/uL (ref 0.7–4.0)
MCH: 32.3 pg (ref 26.0–34.0)
MCHC: 32.1 g/dL (ref 30.0–36.0)
MCV: 100.6 fL — ABNORMAL HIGH (ref 80.0–100.0)
Monocytes Absolute: 0.6 10*3/uL (ref 0.1–1.0)
Monocytes Relative: 14 %
Neutro Abs: 1.5 10*3/uL — ABNORMAL LOW (ref 1.7–7.7)
Neutrophils Relative %: 33 %
Platelets: 175 10*3/uL (ref 150–400)
RBC: 3.28 MIL/uL — ABNORMAL LOW (ref 4.22–5.81)
RDW: 15.2 % (ref 11.5–15.5)
WBC: 4.6 10*3/uL (ref 4.0–10.5)
nRBC: 0 % (ref 0.0–0.2)

## 2019-12-26 LAB — COMPREHENSIVE METABOLIC PANEL
ALT: 46 U/L — ABNORMAL HIGH (ref 0–44)
AST: 59 U/L — ABNORMAL HIGH (ref 15–41)
Albumin: 3.1 g/dL — ABNORMAL LOW (ref 3.5–5.0)
Alkaline Phosphatase: 147 U/L — ABNORMAL HIGH (ref 38–126)
Anion gap: 11 (ref 5–15)
BUN: 5 mg/dL — ABNORMAL LOW (ref 8–23)
CO2: 21 mmol/L — ABNORMAL LOW (ref 22–32)
Calcium: 8.9 mg/dL (ref 8.9–10.3)
Chloride: 108 mmol/L (ref 98–111)
Creatinine, Ser: 0.84 mg/dL (ref 0.61–1.24)
GFR calc Af Amer: >60 mL/min (ref >60)
GFR calc non Af Amer: >60 mL/min (ref >60)
Glucose, Bld: 107 mg/dL — ABNORMAL HIGH (ref 70–99)
Potassium: 3.3 mmol/L — ABNORMAL LOW (ref 3.5–5.1)
Sodium: 140 mmol/L (ref 135–145)
Total Bilirubin: 0.5 mg/dL (ref 0.3–1.2)
Total Protein: 5.9 g/dL — ABNORMAL LOW (ref 6.5–8.1)

## 2019-12-26 LAB — RAPID URINE DRUG SCREEN, HOSP PERFORMED
Amphetamines: NOT DETECTED
Barbiturates: NOT DETECTED
Benzodiazepines: NOT DETECTED
Cocaine: NOT DETECTED
Opiates: NOT DETECTED
Tetrahydrocannabinol: NOT DETECTED

## 2019-12-26 LAB — TROPONIN I (HIGH SENSITIVITY)
Troponin I (High Sensitivity): 13 ng/L (ref <18)
Troponin I (High Sensitivity): 12 ng/L (ref <18)

## 2019-12-26 LAB — LIPASE, BLOOD: Lipase: 38 U/L (ref 11–51)

## 2019-12-26 LAB — PROTIME-INR
INR: 1 (ref 0.8–1.2)
Prothrombin Time: 12.6 seconds (ref 11.4–15.2)

## 2019-12-26 LAB — ETHANOL: Alcohol, Ethyl (B): <10 mg/dL (ref <10)

## 2019-12-26 NOTE — ED Triage Notes (Signed)
Woke up with L sided chest pain this morning.  Chest pain has resolved.  Reports continued pain and weakness to L arm.  No arm drift. Speech clear.  PA at triage to assess pt.

## 2019-12-26 NOTE — ED Provider Notes (Cosign Needed)
MSE was initiated and I personally evaluated the patient and placed orders (if any) at  6:29 PM on December 26, 2019.  The patient appears stable so that the remainder of the MSE may be completed by another provider.  Patient woke up with left sided chest pain.  His chest pain resolved.  He had transient pain and weakness in the left arm.   On my exam he has slight left arm paraesthesias, however is neurovascularly intact otherwise.  5/5 grip strength bilaterally.  Able to lift bilateral arms and legs.  Speech is not slurred.  Smile and facial movements are symmetrical.  Mild recreated pain on left anterior chest.   2+ DP/Radial pulses bilaterally.     Lorin Glass, [place] 12/26/19 2055

## 2019-12-27 NOTE — ED Notes (Signed)
Pt up to desk asking about wait time, pt explained the wait times and acuity. Pt stated that he was being "jacked around" and left angrily from the department.

## 2020-06-01 ENCOUNTER — Encounter (HOSPITAL_COMMUNITY): Payer: Self-pay | Admitting: Family Medicine

## 2020-06-01 ENCOUNTER — Inpatient Hospital Stay (HOSPITAL_COMMUNITY)
Admission: EM | Admit: 2020-06-01 | Discharge: 2020-06-03 | DRG: 280 | Disposition: A | Discharge status: Home / Self Care | Payer: No Typology Code available for payment source | Attending: Internal Medicine | Admitting: Internal Medicine

## 2020-06-01 ENCOUNTER — Emergency Department (HOSPITAL_COMMUNITY): Payer: No Typology Code available for payment source

## 2020-06-01 ENCOUNTER — Inpatient Hospital Stay (HOSPITAL_COMMUNITY): Payer: No Typology Code available for payment source

## 2020-06-01 DIAGNOSIS — K219 Gastro-esophageal reflux disease without esophagitis: Secondary | ICD-10-CM | POA: Diagnosis present

## 2020-06-01 DIAGNOSIS — Z951 Presence of aortocoronary bypass graft: Secondary | ICD-10-CM | POA: Diagnosis not present

## 2020-06-01 DIAGNOSIS — I214 Non-ST elevation (NSTEMI) myocardial infarction: Secondary | ICD-10-CM | POA: Diagnosis present

## 2020-06-01 DIAGNOSIS — Z90411 Acquired partial absence of pancreas: Secondary | ICD-10-CM | POA: Diagnosis not present

## 2020-06-01 DIAGNOSIS — Z20822 Contact with and (suspected) exposure to covid-19: Secondary | ICD-10-CM | POA: Diagnosis present

## 2020-06-01 DIAGNOSIS — I255 Ischemic cardiomyopathy: Secondary | ICD-10-CM | POA: Diagnosis present

## 2020-06-01 DIAGNOSIS — Z7982 Long term (current) use of aspirin: Secondary | ICD-10-CM

## 2020-06-01 DIAGNOSIS — M1990 Unspecified osteoarthritis, unspecified site: Secondary | ICD-10-CM | POA: Diagnosis present

## 2020-06-01 DIAGNOSIS — E785 Hyperlipidemia, unspecified: Secondary | ICD-10-CM | POA: Diagnosis present

## 2020-06-01 DIAGNOSIS — J9601 Acute respiratory failure with hypoxia: Secondary | ICD-10-CM | POA: Insufficient documentation

## 2020-06-01 DIAGNOSIS — W1830XD Fall on same level, unspecified, subsequent encounter: Secondary | ICD-10-CM

## 2020-06-01 DIAGNOSIS — Z960 Presence of urogenital implants: Secondary | ICD-10-CM | POA: Diagnosis present

## 2020-06-01 DIAGNOSIS — I110 Hypertensive heart disease with heart failure: Principal | ICD-10-CM | POA: Diagnosis present

## 2020-06-01 DIAGNOSIS — Z79899 Other long term (current) drug therapy: Secondary | ICD-10-CM

## 2020-06-01 DIAGNOSIS — D136 Benign neoplasm of pancreas: Secondary | ICD-10-CM | POA: Diagnosis not present

## 2020-06-01 DIAGNOSIS — Z923 Personal history of irradiation: Secondary | ICD-10-CM | POA: Diagnosis not present

## 2020-06-01 DIAGNOSIS — I471 Supraventricular tachycardia: Secondary | ICD-10-CM | POA: Diagnosis not present

## 2020-06-01 DIAGNOSIS — S42201D Unspecified fracture of upper end of right humerus, subsequent encounter for fracture with routine healing: Secondary | ICD-10-CM | POA: Diagnosis not present

## 2020-06-01 DIAGNOSIS — E8809 Other disorders of plasma-protein metabolism, not elsewhere classified: Secondary | ICD-10-CM | POA: Diagnosis present

## 2020-06-01 DIAGNOSIS — I252 Old myocardial infarction: Secondary | ICD-10-CM | POA: Diagnosis not present

## 2020-06-01 DIAGNOSIS — Z79890 Hormone replacement therapy (postmenopausal): Secondary | ICD-10-CM

## 2020-06-01 DIAGNOSIS — R0602 Shortness of breath: Secondary | ICD-10-CM

## 2020-06-01 DIAGNOSIS — Z8249 Family history of ischemic heart disease and other diseases of the circulatory system: Secondary | ICD-10-CM

## 2020-06-01 DIAGNOSIS — J219 Acute bronchiolitis, unspecified: Secondary | ICD-10-CM | POA: Diagnosis present

## 2020-06-01 DIAGNOSIS — Z9221 Personal history of antineoplastic chemotherapy: Secondary | ICD-10-CM

## 2020-06-01 DIAGNOSIS — Z87891 Personal history of nicotine dependence: Secondary | ICD-10-CM

## 2020-06-01 DIAGNOSIS — R7989 Other specified abnormal findings of blood chemistry: Secondary | ICD-10-CM

## 2020-06-01 DIAGNOSIS — S22000S Wedge compression fracture of unspecified thoracic vertebra, sequela: Secondary | ICD-10-CM

## 2020-06-01 DIAGNOSIS — I5023 Acute on chronic systolic (congestive) heart failure: Secondary | ICD-10-CM | POA: Diagnosis present

## 2020-06-01 DIAGNOSIS — I2510 Atherosclerotic heart disease of native coronary artery without angina pectoris: Secondary | ICD-10-CM | POA: Diagnosis present

## 2020-06-01 DIAGNOSIS — Z79891 Long term (current) use of opiate analgesic: Secondary | ICD-10-CM

## 2020-06-01 DIAGNOSIS — I5021 Acute systolic (congestive) heart failure: Secondary | ICD-10-CM

## 2020-06-01 DIAGNOSIS — S42211D Unspecified displaced fracture of surgical neck of right humerus, subsequent encounter for fracture with routine healing: Secondary | ICD-10-CM

## 2020-06-01 DIAGNOSIS — N179 Acute kidney failure, unspecified: Secondary | ICD-10-CM | POA: Diagnosis present

## 2020-06-01 DIAGNOSIS — N289 Disorder of kidney and ureter, unspecified: Secondary | ICD-10-CM

## 2020-06-01 DIAGNOSIS — E039 Hypothyroidism, unspecified: Secondary | ICD-10-CM | POA: Diagnosis present

## 2020-06-01 DIAGNOSIS — C259 Malignant neoplasm of pancreas, unspecified: Secondary | ICD-10-CM | POA: Diagnosis present

## 2020-06-01 DIAGNOSIS — I509 Heart failure, unspecified: Secondary | ICD-10-CM | POA: Diagnosis present

## 2020-06-01 DIAGNOSIS — M4854XD Collapsed vertebra, not elsewhere classified, thoracic region, subsequent encounter for fracture with routine healing: Secondary | ICD-10-CM | POA: Diagnosis present

## 2020-06-01 DIAGNOSIS — S42201A Unspecified fracture of upper end of right humerus, initial encounter for closed fracture: Secondary | ICD-10-CM | POA: Diagnosis present

## 2020-06-01 DIAGNOSIS — I429 Cardiomyopathy, unspecified: Secondary | ICD-10-CM | POA: Diagnosis not present

## 2020-06-01 LAB — I-STAT ARTERIAL BLOOD GAS, ED
Acid-Base Excess: 3 mmol/L — ABNORMAL HIGH (ref 0.0–2.0)
Bicarbonate: 27.7 mmol/L (ref 20.0–28.0)
Calcium, Ion: 1.14 mmol/L — ABNORMAL LOW (ref 1.15–1.40)
HCT: 33 % — ABNORMAL LOW (ref 39.0–52.0)
Hemoglobin: 11.2 g/dL — ABNORMAL LOW (ref 13.0–17.0)
O2 Saturation: 98 %
Patient temperature: 98.6
Potassium: 3.5 mmol/L (ref 3.5–5.1)
Sodium: 142 mmol/L (ref 135–145)
TCO2: 29 mmol/L (ref 22–32)
pCO2 arterial: 43.4 mmHg (ref 32.0–48.0)
pH, Arterial: 7.413 (ref 7.350–7.450)
pO2, Arterial: 113 mmHg — ABNORMAL HIGH (ref 83.0–108.0)

## 2020-06-01 LAB — COMPREHENSIVE METABOLIC PANEL
ALT: 31 U/L (ref 0–44)
AST: 47 U/L — ABNORMAL HIGH (ref 15–41)
Albumin: 3 g/dL — ABNORMAL LOW (ref 3.5–5.0)
Alkaline Phosphatase: 151 U/L — ABNORMAL HIGH (ref 38–126)
Anion gap: 20 — ABNORMAL HIGH (ref 5–15)
BUN: 9 mg/dL (ref 8–23)
CO2: 17 mmol/L — ABNORMAL LOW (ref 22–32)
Calcium: 8.9 mg/dL (ref 8.9–10.3)
Chloride: 107 mmol/L (ref 98–111)
Creatinine, Ser: 1.32 mg/dL — ABNORMAL HIGH (ref 0.61–1.24)
GFR, Estimated: 56 mL/min — ABNORMAL LOW (ref >60)
Glucose, Bld: 162 mg/dL — ABNORMAL HIGH (ref 70–99)
Potassium: 4.3 mmol/L (ref 3.5–5.1)
Sodium: 144 mmol/L (ref 135–145)
Total Bilirubin: 1 mg/dL (ref 0.3–1.2)
Total Protein: 5.9 g/dL — ABNORMAL LOW (ref 6.5–8.1)

## 2020-06-01 LAB — CBC WITH DIFFERENTIAL/PLATELET
Abs Immature Granulocytes: 0.04 10*3/uL (ref 0.00–0.07)
Basophils Absolute: 0.1 10*3/uL (ref 0.0–0.1)
Basophils Relative: 1 %
Eosinophils Absolute: 0.4 10*3/uL (ref 0.0–0.5)
Eosinophils Relative: 4 %
HCT: 40 % (ref 39.0–52.0)
Hemoglobin: 11.8 g/dL — ABNORMAL LOW (ref 13.0–17.0)
Immature Granulocytes: 0 %
Lymphocytes Relative: 54 %
Lymphs Abs: 5.5 10*3/uL — ABNORMAL HIGH (ref 0.7–4.0)
MCH: 30.7 pg (ref 26.0–34.0)
MCHC: 29.5 g/dL — ABNORMAL LOW (ref 30.0–36.0)
MCV: 104.2 fL — ABNORMAL HIGH (ref 80.0–100.0)
Monocytes Absolute: 0.7 10*3/uL (ref 0.1–1.0)
Monocytes Relative: 7 %
Neutro Abs: 3.6 10*3/uL (ref 1.7–7.7)
Neutrophils Relative %: 34 %
Platelets: 323 10*3/uL (ref 150–400)
RBC: 3.84 MIL/uL — ABNORMAL LOW (ref 4.22–5.81)
RDW: 14.4 % (ref 11.5–15.5)
WBC: 10.4 10*3/uL (ref 4.0–10.5)
nRBC: 0 % (ref 0.0–0.2)

## 2020-06-01 LAB — TROPONIN I (HIGH SENSITIVITY)
Troponin I (High Sensitivity): 87 ng/L — ABNORMAL HIGH (ref <18)
Troponin I (High Sensitivity): 238 ng/L (ref <18)
Troponin I (High Sensitivity): 573 ng/L (ref <18)
Troponin I (High Sensitivity): 863 ng/L (ref <18)
Troponin I (High Sensitivity): 1664 ng/L (ref <18)

## 2020-06-01 LAB — POC SARS CORONAVIRUS 2 AG -  ED: SARS Coronavirus 2 Ag: NEGATIVE

## 2020-06-01 LAB — ECHOCARDIOGRAM COMPLETE
Area-P 1/2: 4.85 cm2
Calc EF: 19.9 %
Height: 67 in
S' Lateral: 5.4 cm
Single Plane A2C EF: 17 %
Single Plane A4C EF: 18.9 %
Weight: 2880 oz

## 2020-06-01 LAB — SARS CORONAVIRUS 2 BY RT PCR (HOSPITAL ORDER, PERFORMED IN ~~LOC~~ HOSPITAL LAB): SARS Coronavirus 2: NEGATIVE

## 2020-06-01 LAB — BRAIN NATRIURETIC PEPTIDE: B Natriuretic Peptide: 3690 pg/mL — ABNORMAL HIGH (ref 0.0–100.0)

## 2020-06-01 MED ORDER — NITROGLYCERIN 2 % TD OINT
1.0000 [in_us] | TOPICAL_OINTMENT | Freq: Once | TRANSDERMAL | Status: DC
  Filled 2020-06-01: qty 1

## 2020-06-01 MED ORDER — ATORVASTATIN CALCIUM 80 MG PO TABS
80.0000 mg | ORAL_TABLET | Freq: Every day | ORAL | Status: DC
  Administered 2020-06-01 – 2020-06-03 (×3): 80 mg via ORAL
  Filled 2020-06-01 (×2): qty 1
  Filled 2020-06-01: qty 2

## 2020-06-01 MED ORDER — HEPARIN SODIUM (PORCINE) 5000 UNIT/ML IJ SOLN
5000.0000 [IU] | Freq: Three times a day (TID) | INTRAMUSCULAR | Status: DC
  Administered 2020-06-01 – 2020-06-02 (×3): 5000 [IU] via SUBCUTANEOUS
  Filled 2020-06-01 (×2): qty 1

## 2020-06-01 MED ORDER — ONDANSETRON HCL 4 MG/2ML IJ SOLN
4.0000 mg | Freq: Four times a day (QID) | INTRAMUSCULAR | Status: DC | PRN
  Administered 2020-06-01: 06:00:00 4 mg via INTRAVENOUS
  Filled 2020-06-01: qty 2

## 2020-06-01 MED ORDER — HEPARIN (PORCINE) 25000 UT/250ML-% IV SOLN
1200.0000 [IU]/h | INTRAVENOUS | Status: DC
  Filled 2020-06-01: qty 250

## 2020-06-01 MED ORDER — MORPHINE SULFATE (PF) 2 MG/ML IV SOLN
2.0000 mg | INTRAVENOUS | Status: DC | PRN
  Administered 2020-06-01: 06:00:00 2 mg via INTRAVENOUS
  Administered 2020-06-01: 4 mg via INTRAVENOUS
  Filled 2020-06-01: qty 2
  Filled 2020-06-01: qty 1
  Filled 2020-06-01: qty 2

## 2020-06-01 MED ORDER — FUROSEMIDE 10 MG/ML IJ SOLN
40.0000 mg | Freq: Two times a day (BID) | INTRAMUSCULAR | Status: DC
  Administered 2020-06-01 – 2020-06-02 (×3): 40 mg via INTRAVENOUS
  Filled 2020-06-01 (×3): qty 4

## 2020-06-01 MED ORDER — ENOXAPARIN SODIUM 40 MG/0.4ML ~~LOC~~ SOLN: 40.0000 mg | SUBCUTANEOUS | Status: DC

## 2020-06-01 MED ORDER — ACETAMINOPHEN 325 MG PO TABS
650.0000 mg | ORAL_TABLET | ORAL | Status: DC | PRN
  Administered 2020-06-01 – 2020-06-02 (×2): 650 mg via ORAL
  Filled 2020-06-01 (×2): qty 2

## 2020-06-01 MED ORDER — SODIUM CHLORIDE 0.9 % IV SOLN: 250.0000 mL | INTRAVENOUS | Status: DC | PRN

## 2020-06-01 MED ORDER — PANTOPRAZOLE SODIUM 40 MG PO TBEC
40.0000 mg | DELAYED_RELEASE_TABLET | Freq: Every day | ORAL | Status: DC
  Administered 2020-06-01 – 2020-06-03 (×3): 40 mg via ORAL
  Filled 2020-06-01 (×3): qty 1

## 2020-06-01 MED ORDER — ONDANSETRON HCL 4 MG/2ML IJ SOLN: 4.0000 mg | Freq: Four times a day (QID) | INTRAMUSCULAR | Status: DC | PRN

## 2020-06-01 MED ORDER — HEPARIN BOLUS VIA INFUSION
5000.0000 [IU] | Freq: Once | INTRAVENOUS | Status: DC
  Filled 2020-06-01: qty 5000

## 2020-06-01 MED ORDER — PERFLUTREN LIPID MICROSPHERE
1.0000 mL | INTRAVENOUS | Status: AC | PRN
  Administered 2020-06-01: 2 mL via INTRAVENOUS
  Filled 2020-06-01: qty 10

## 2020-06-01 MED ORDER — IOHEXOL 350 MG/ML SOLN
100.0000 mL | Freq: Once | INTRAVENOUS | Status: AC | PRN
  Administered 2020-06-01: 100 mL via INTRAVENOUS

## 2020-06-01 MED ORDER — ISOSORBIDE MONONITRATE ER 30 MG PO TB24
30.0000 mg | ORAL_TABLET | Freq: Every day | ORAL | Status: DC
  Administered 2020-06-01 – 2020-06-02 (×2): 30 mg via ORAL
  Filled 2020-06-01 (×2): qty 1

## 2020-06-01 MED ORDER — ASPIRIN EC 81 MG PO TBEC
81.0000 mg | DELAYED_RELEASE_TABLET | Freq: Every day | ORAL | Status: DC
  Administered 2020-06-01 – 2020-06-03 (×3): 81 mg via ORAL
  Filled 2020-06-01 (×3): qty 1

## 2020-06-01 MED ORDER — FUROSEMIDE 10 MG/ML IJ SOLN
40.0000 mg | Freq: Once | INTRAMUSCULAR | Status: AC
  Administered 2020-06-01: 40 mg via INTRAVENOUS
  Filled 2020-06-01: qty 4

## 2020-06-01 NOTE — Progress Notes (Signed)
Orthopedic Tech Progress Note Patient Details:  Isaiah Patterson [birth year] [id number]  Ortho Devices Type of Ortho Device: Shoulder immobilizer Ortho Device/Splint Location: RUE Ortho Device/Splint Interventions: Ordered,Application,Adjustment   Post Interventions Patient Tolerated: Well Instructions Provided: Care of device   Janit Pagan 06/01/2020, 8:30 AM

## 2020-06-01 NOTE — Progress Notes (Signed)
  Echocardiogram 2D Echocardiogram has been performed.  Isaiah Patterson 06/01/2020, 3:33 PM

## 2020-06-01 NOTE — ED Notes (Addendum)
RT at bedside and placed pt on BiPAP. Pt tolerating BiPAP well.

## 2020-06-01 NOTE — ED Notes (Signed)
Pt requesting lunch, order for NPO by admitting provider, message send to Dr. Loleta Books to consult about this. Family a the bedside, pt attached to monitor, pt AO x4 to bed rails up, NAD noticed.

## 2020-06-01 NOTE — ED Provider Notes (Signed)
[facility] EMERGENCY DEPARTMENT Provider Note   CSN: [id number] Arrival date & time: 06/01/20  0044     History Chief Complaint  Patient presents with  . Shortness of Breath    Isaiah Patterson is a 75 y.o. male with a hx of hypertension, hyperlipidemia, CAD S/p CABG, & pancreatic cancer S/p whipple & chemo/radiation completed in October of 2021 who presents to the ED for evaluation of acute worsening dyspnea that began @ 2300 tonight. Patient reports over the past 1 month he has had dyspnea and bilateral LE swelling, however tonight around 2300 he acutely worsened with significant increased work of breathing. Dyspnea is worse with activity and when supine, no alleviating factors. Has had some cough productive of white phlegm sputum. Denies fever, chills, chest pain, abdominal pain, nausea/vomiting, hemoptysis, or syncope.    HPI     Past Medical History:  Diagnosis Date  . Arthritis    "hands, fingers" (02/03/2013)  . Blood dyscrasia    "told I was a free bleeder when I was young; my blood does have trouble clotting" (02/03/2013)  . Cancer ([place])   . Coronary artery disease    s/p CABG  . GERD (gastroesophageal reflux disease)   . Hyperlipidemia   . Hypertension   . Myocardial infarct ([place]) 2000  . Pneumonia    "couple times" (02/03/2013)    Patient Active Problem List   Diagnosis Date Noted  . Obstructive jaundice   . Hyperbilirubinemia 04/01/2019  . Transaminitis 04/01/2019  . Elevated alkaline phosphatase level 04/01/2019  . Painless jaundice 03/31/2019  . Pancreatic lesion 03/31/2019  . Obese 02/27/2013  . Unstable angina ([place]) 02/03/2013  . CAD (coronary artery disease) 02/03/2013  . HTN (hypertension) 02/03/2013  . Dyslipidemia 02/03/2013    Past Surgical History:  Procedure Laterality Date  . BILIARY DILATION  04/02/2019   Procedure: BILIARY DILATION;  Surgeon: Arta Silence, MD;  Location: [facility] ENDOSCOPY;  Service: Endoscopy;;  . BILIARY  STENT PLACEMENT  04/02/2019   Procedure: BILIARY STENT PLACEMENT;  Surgeon: Arta Silence, MD;  Location: [facility] ENDOSCOPY;  Service: Endoscopy;;  . CORONARY ARTERY BYPASS GRAFT  2000   "CABG X5" (02/03/2013)  . ERCP N/A 04/02/2019   Procedure: ENDOSCOPIC RETROGRADE CHOLANGIOPANCREATOGRAPHY (ERCP);  Surgeon: Arta Silence, MD;  Location: [facility] ENDOSCOPY;  Service: Endoscopy;  Laterality: N/A;  . ESOPHAGOGASTRODUODENOSCOPY (EGD) WITH PROPOFOL N/A 04/02/2019   Procedure: ESOPHAGOGASTRODUODENOSCOPY (EGD) WITH PROPOFOL;  Surgeon: Arta Silence, MD;  Location: [place] [facility];  Service: Endoscopy;  Laterality: N/A;  . EUS N/A 04/02/2019   Procedure: UPPER ENDOSCOPIC ULTRASOUND (EUS) LINEAR;  Surgeon: Arta Silence, MD;  Location: [facility];  Service: Endoscopy;  Laterality: N/A;  . FINE NEEDLE ASPIRATION  04/02/2019   Procedure: FINE NEEDLE ASPIRATION (FNA) LINEAR;  Surgeon: Arta Silence, MD;  Location: [facility] ENDOSCOPY;  Service: Endoscopy;;  . LEFT HEART CATHETERIZATION WITH CORONARY/GRAFT ANGIOGRAM N/A 02/04/2013   Procedure: LEFT HEART CATHETERIZATION WITH Beatrix Fetters;  Surgeon: Candee Furbish, MD;  Location: [facility] CATH LAB;  Service: Cardiovascular;  Laterality: N/A;  . PANCREATIC STENT PLACEMENT  04/02/2019   Procedure: PANCREATIC STENT PLACEMENT;  Surgeon: Arta Silence, MD;  Location: [facility] ENDOSCOPY;  Service: Endoscopy;;  . SPHINCTEROTOMY  04/02/2019   Procedure: SPHINCTEROTOMY;  Surgeon: Arta Silence, MD;  Location: [facility] ENDOSCOPY;  Service: Endoscopy;;  . TONSILLECTOMY  1950's       Family History  Problem Relation Age of Onset  . Heart disease Mother   . Heart disease Father  Social History   Tobacco Use  . Smoking status: Former Smoker    Packs/day: 1.00    Years: 25.00    Pack years: 25.00    Types: Cigarettes  . Smokeless tobacco: Never Used  . Tobacco comment: 02/03/2013 "quit smoking ~ 25 yr ago"  Substance Use Topics  . Alcohol use: Not Currently    Comment:  02/03/2013 "stopped drinking alcohol in the 1990's; never had problem w/it"  . Drug use: No    Home Medications Prior to Admission medications   Medication Sig Start Date End Date Taking? Authorizing Provider  aspirin EC 81 MG tablet Take 1 tablet (81 mg total) by mouth daily. 02/04/13   Jerline Pain, MD  famotidine (PEPCID) 20 MG tablet Take 20 mg by mouth 2 (two) times daily.    [provider]  isosorbide mononitrate (IMDUR) 30 MG 24 hr tablet Take 1 tablet (30 mg total) by mouth daily. 02/04/13   Jerline Pain, MD  lisinopril (PRINIVIL,ZESTRIL) 20 MG tablet Take 20 mg by mouth daily.  11/17/12   [provider]  Multiple Vitamin (MULTIVITAMIN WITH MINERALS) TABS tablet Take 1 tablet by mouth daily.    [provider]  nicotine (NICODERM CQ - DOSED IN MG/24 HOURS) 21 mg/24hr patch Place 21 mg onto the skin daily as needed (smoking cession).     [provider]  nitroGLYCERIN (NITROSTAT) 0.4 MG SL tablet Place 1 tablet (0.4 mg total) under the tongue every 5 (five) minutes as needed for chest pain. 02/04/13   Jerline Pain, MD  omeprazole (PRILOSEC) 40 MG capsule Take 40 mg by mouth daily. 11/22/12   [provider]  pantoprazole (PROTONIX) 40 MG tablet Take 40 mg by mouth daily.    [provider]  sertraline (ZOLOFT) 100 MG tablet Take 200 mg by mouth daily.    [provider]  traMADol (ULTRAM) 50 MG tablet Take 1 tablet (50 mg total) by mouth every 6 (six) hours as needed for moderate pain. 04/04/19   Isaac Bliss, Rayford Halsted, MD    Allergies    Patient has no known allergies.  Review of Systems   Review of Systems  Constitutional: Negative for chills and fever.  Respiratory: Positive for cough and shortness of breath.   Cardiovascular: Positive for leg swelling. Negative for chest pain.  Gastrointestinal: Negative for abdominal pain, nausea and vomiting.  Neurological: Negative for syncope.  All other systems reviewed  and are negative.   Physical Exam Updated Vital Signs BP (!) 194/145 (BP Location: Left Leg)   Pulse (!) 140   Temp 98.5 F (36.9 C) (Oral)   Resp (!) 26   SpO2 (!) 86%   Physical Exam Vitals and nursing note reviewed.  Constitutional:      General: He is in acute distress (appears uncomfortable).     Appearance: He is ill-appearing.  HENT:     Head: Normocephalic and atraumatic.  Eyes:     Pupils: Pupils are equal, round, and reactive to light.  Cardiovascular:     Rate and Rhythm: Tachycardia present.  Pulmonary:     Effort: Tachypnea and accessory muscle usage present.     Breath sounds: Rales (bibasilar) present.     Comments: SpO2 90% on 4L via [place]. Speaking in short sentences.  Abdominal:     Tenderness: There is no abdominal tenderness. There is no guarding or rebound.  Musculoskeletal:     Cervical back: Neck supple.  Comments: 1+ symmetric pitting edema to bilateral lower legs.   Skin:    Comments: Skin somewhat mottled.  Ecchymosis to right upper inner arm and to the anterior chest- patient reports fall recently- seen @ [facility] facility for this.   Neurological:     Mental Status: He is alert.     Comments: Alert. Clear speech.   Psychiatric:     Comments: Cooperative    ED Results / Procedures / Treatments   Labs (all labs ordered are listed, but only abnormal results are displayed) Labs Reviewed  COMPREHENSIVE METABOLIC PANEL - Abnormal; Notable for the following components:      Result Value   CO2 17 (*)    Glucose, Bld 162 (*)    Creatinine, Ser 1.32 (*)    Total Protein 5.9 (*)    Albumin 3.0 (*)    AST 47 (*)    Alkaline Phosphatase 151 (*)    GFR, Estimated 56 (*)    Anion gap 20 (*)    All other components within normal limits  CBC WITH DIFFERENTIAL/PLATELET - Abnormal; Notable for the following components:   RBC 3.84 (*)    Hemoglobin 11.8 (*)    MCV 104.2 (*)    MCHC 29.5 (*)    Lymphs Abs 5.5 (*)    All other components within  normal limits  BRAIN NATRIURETIC PEPTIDE - Abnormal; Notable for the following components:   B Natriuretic Peptide 3,690.0 (*)    All other components within normal limits  I-STAT ARTERIAL BLOOD GAS, ED - Abnormal; Notable for the following components:   pO2, Arterial 113 (*)    Acid-Base Excess 3.0 (*)    Calcium, Ion 1.14 (*)    HCT 33.0 (*)    Hemoglobin 11.2 (*)    All other components within normal limits  TROPONIN I (HIGH SENSITIVITY) - Abnormal; Notable for the following components:   Troponin I (High Sensitivity) 87 (*)    All other components within normal limits  SARS CORONAVIRUS 2 BY RT PCR (HOSPITAL ORDER, [facility])  BLOOD GAS, ARTERIAL  HEPARIN LEVEL (UNFRACTIONATED)  POC SARS CORONAVIRUS 2 AG -  ED  TROPONIN I (HIGH SENSITIVITY)    EKG EKG Interpretation  Date/Time:  Wednesday June 01 2020 01:10:29 EST Ventricular Rate:  140 PR Interval:    QRS Duration: 86 QT Interval:  311 QTC Calculation: 475 R Axis:   27 Text Interpretation: Sinus tachycardia Supraventricular bigeminy Probable anterior infarct, old Confirmed by Randal Buba, April ([id number]) on 06/01/2020 1:12:21 AM   Radiology CT Angio Chest PE W/Cm &/Or Wo Cm  Result Date: 06/01/2020 CLINICAL DATA:  Chest pain, shortness of breath. EXAM: CT ANGIOGRAPHY CHEST WITH CONTRAST TECHNIQUE: Multidetector CT imaging of the chest was performed using the standard protocol during bolus administration of intravenous contrast. Multiplanar CT image reconstructions and MIPs were obtained to evaluate the vascular anatomy. CONTRAST:  140mL OMNIPAQUE IOHEXOL 350 MG/ML SOLN COMPARISON:  Chest x-ray 06/01/2020 CT chest 04/27/2019. FINDINGS: Cardiovascular: Satisfactory opacification of the pulmonary arteries to the segmental level. No evidence of pulmonary embolism. Enlarged left heart. No significant pericardial effusion. The thoracic aorta is normal in caliber. Mild atherosclerotic plaque of the thoracic  aorta. At least 3 vessel moderate to severe coronary artery calcifications. Mediastinum/Nodes: Borderline enlarged 1 cm right paratracheal lymph node. Similarly prominent right hilar lymph node. No enlarged mediastinal, hilar, or axillary lymph nodes. Trace debris within the trachea. Otherwise the thyroid gland, trachea, and esophagus demonstrate no  significant findings. Lungs/Pleura: Bilateral trace pleural effusions, left greater than right. Bilateral lower lobe subsegmental and linear atelectasis. Linear atelectasis within the lingula. Diffuse bronchial wall thickening. Centrilobular nodularity within bilateral upper lobes. Upper Abdomen: A splenule is noted.  No acute abnormality. Musculoskeletal: Marked bilateral gynecomastia. Mild subcutaneus soft tissue edema of the right chest wall and slightly asymmetric/hazy right pectoralis muscle. Acute comminuted right humeral head and neck fracture. Interval worsening of a T9 (greater than 75% height loss) and T12 (greater than 50% height loss) compression fracture. Similar-appearing anterior wedge shaped T8 (greater than 75% height loss) compression fracture. Several other vertebral bodies demonstrate vertebral height loss. Old healed bilateral rib fractures. No definite acute displaced rib fracture. Review of the MIP images confirms the above findings. IMPRESSION: 1. No pulmonary embolus. 2. Findings suggestive of bronchiolitis. Followup PA and lateral chest X-ray is recommended in 3-4 weeks following therapy to ensure resolution and exclude underlying malignancy. 3. Interval development of bilateral, left greater than right trace pleural effusions in the setting left cardiomegaly and three-vessel coronary artery calcifications. 4. Acute comminuted right humeral head and neck fracture. Associated right shoulder cutaneous soft tissue edema as well as right chest wall edema. Recommend dedicated right shoulder radiographs for further evaluation. 5. Interval worsening of  T9 and T12 compression fractures. Correlate with point tenderness to palpation to evaluate for an acute component. Stable T8 compression fracture. 6. Aortic Atherosclerosis (ICD10-I70.0). Electronically Signed   By: Iven Finn M.D.   On: 06/01/2020 04:36   DG Chest Portable 1 View  Result Date: 06/01/2020 CLINICAL DATA:  Shortness of breath EXAM: PORTABLE CHEST 1 VIEW COMPARISON:  12/26/2019 FINDINGS: Cardiac shadow is enlarged in size. Postsurgical changes are noted. Right chest wall port is seen. Mild vascular congestion is noted with interstitial edema. No focal infiltrate or sizable effusion is seen. No bony abnormality is noted. IMPRESSION: Mild changes of CHF. Electronically Signed   By: Inez Catalina M.D.   On: 06/01/2020 01:22    Procedures .Critical Care Performed by: Amaryllis Dyke, PA-C Authorized by: Amaryllis Dyke, PA-C    CRITICAL CARE Performed by: Kennith Maes   Total critical care time: 45 minutes  Critical care time was exclusive of separately billable procedures and treating other patients.  Critical care was necessary to treat or prevent imminent or life-threatening deterioration.  Critical care was time spent personally by me on the following activities: development of treatment plan with patient and/or surrogate as well as nursing, discussions with consultants, evaluation of patient's response to treatment, examination of patient, obtaining history from patient or surrogate, ordering and performing treatments and interventions, ordering and review of laboratory studies, ordering and review of radiographic studies, pulse oximetry and re-evaluation of patient's condition.    Medications Ordered in ED Medications  heparin bolus via infusion 5,000 Units (has no administration in time range)  heparin ADULT infusion 100 units/mL (25000 units/247mL) (has no administration in time range)  furosemide (LASIX) injection 40 mg (40 mg Intravenous Given  06/01/20 0130)    ED Course  I have reviewed the triage vital signs and the nursing notes.  Pertinent labs & imaging results that were available during my care of the patient were reviewed by me and considered in my medical decision making (see chart for details).    MDM Rules/Calculators/A&P                          Patient presents to the ED with  complaints of acute worsening of dyspnea.  Patient appears uncomfortable & ill appearing with increased work of breathing. Hypoxic, tachypneic, hypertensive. Initial plan was for nitro paste but repeat BP is [id number] systolic- this will therefore not be given. On 4L via [place]- called respiratory for BiPAP @ 01:22. Rapid covid negative.   DDX: Acute CHF/pulmonary edema, pulmonary embolism, ACS, Arrhythmia, pneumonia, covid 96, pneumothorax, mets  Additional history obtained:  Additional history obtained from patient's wife & chart review.   Imaging Studies ordered:  I ordered imaging studies which included CXR, I independently visualized and interpreted imaging which showed mild CHF. --> 40 mg of IV lasix ordered  Lab Tests:  I Ordered, reviewed, and interpreted labs, which included:  CBC: No leukocytosis. Anemia similar to prior.  CMP: Increased creatinine & anion gap w/ bicarb of 17. Hypoalbuminemia noted.  BNP: Grossly elevated @ 3690 Troponin: Elevated @ 87 COVID: Negative  ED Course:  03:30: Patient taken off BIPAP, appears significantly improved, skin no longer mottled, no longer tachypneic, did desaturate in the 80s on RA- applied 2L via [place] with improvement. HR improved, rhythm appears irregular- question afib. Patient feeling much better.   Patient with acute hypoxic respiratory failure requiring BiPAP.  Chest x-ray and elevated BNP with his peripheral edema make CHF most likely.  Elevated troponin possibly demand related, discussed w/ attending & will start heparin for possible NSTEMI and also for concern of pulmonary embolism remaining on  the differential-CT angio of the chest ordered now that patient improved from a respiratory stability standpoint.  Will discuss with hospital service for admission. Patient & his wife updated on results & plan of care and are in agreement.   04:17: CONSULT: Discussed with hospitalist Dr. Myna Hidalgo- accepts admission.   CTA negative for PE. Pleural effusions & cardiomegaly present. Humeral head/neck fx noted- has seen [facility] for this- sling ordered. Compression fxs seem somewhat worse- seen by [facility] for this as well- does have some thoracic tenderness on re-exam. Patient admitted to hospitalist service.   Findings and plan of care discussed with supervising physician Dr. Randal Buba who has evaluated patient who is in agreement.   Portions of this note were generated with [employer]. Dictation errors may occur despite best attempts at proofreading.  Final Clinical Impression(s) / ED Diagnoses Final diagnoses:  Acute hypoxemic respiratory failure ([employer])  Acute congestive heart failure, unspecified heart failure type [facility])    Rx / DC Orders ED Discharge Orders    None       Amaryllis Dyke, PA-C 06/01/20 0507    Palumbo, April, MD 06/01/20 [phone]

## 2020-06-01 NOTE — ED Notes (Signed)
Echo at the bedside °

## 2020-06-01 NOTE — Progress Notes (Signed)
PROGRESS NOTE    Isaiah Patterson  IWP:[id number] DOB: [birth year] DOA: 06/01/2020 PCP: [facility]      Brief Narrative:  Isaiah Patterson is a 75 y.o. M with CAD status post remote CABG, pancreatic adenocarcinoma status post Whipple procedure with recent evidence for recurrence, fall 2 weeks ago with thoracic compression and proximal right humerus fractures, now presenting to the emergency department for evaluation of acute onset shortness of breath.  Shortly after lying down to sleep developed acute onset shortness of breath.  Daphoretic and pale with labored respirations.  Patient describes feeling as though he was going to die.  He denies associated nausea or chest pain.  Reports that he has had some mild exertional dyspnea with cough productive of scant white sputum for approximately 1 month but denies any fevers or chills.  Patient reports some mild bilateral lower leg swelling in the past month.      In the Ed, acute respiratory distress with pallor, diaphoresis, and labored breathing, tachycardic in the 140s, and with stable blood pressure.   Chest x-ray findings suggestive of mild CHF.     BNP elevated to 3690.  High-sensitivity troponin was 87.  Covid PCR was negative.  Patient was started on BiPAP and given 40 mg IV Lasix in the ED.  CTA chest was negative for PE but notable for bronchiolitis and interval development of trace bilateral pleural effusions with left cardiomegaly.         Assessment & Plan:  Acute on chronic systolic CHF -Furosemide 40 mg IV twice a day  -K supplement -Strict I/Os, daily weights, telemetry  -Daily monitoring renal function -Consult Cardiology, appreciate cares   -Hold metoprolol due to acute decompensated CHF -Hold losartan, spironolactone due to AKI   Demand ischemia vs ACS Coronary artery disease, secondary prevention -Continue aspirin, atorvastatin -Continue Imdur -Defer heparin to cardiology  Pancreatic cancer, now  recurrent   Mild AKI  Cr 1.3, up from baseline 0.8.  COngestive nephropathy. -Diurese and closely monitor Cr -Hold spiro, ARB  GERD -Continue PPI    Other medications -Hold home duloxetine, gabapentin for now         Disposition: Status is: Inpatient  Remains inpatient appropriate because:IV treatments appropriate due to intensity of illness or inability to take PO   Dispo: The patient is from: Home              Anticipated d/c is to: Home              Anticipated d/c date is: 3 days              Patient currently is not medically stable to d/c.   Difficult to place patient No              MDM: This is a no charge note.  For further details, please see H&P by my partner Dr. Myna Hidalgo from earlier today.  The below labs and imaging reports were reviewed and summarized above.    DVT prophylaxis: heparin injection 5,000 Units Start: 06/01/20 1400  Code Status: FULL Family Communication: Fiance at the bedside           Subjective: Patient's breathing is much better.  Swelling is resolved.        Objective: Vitals:   06/01/20 1840 06/01/20 1840 06/01/20 2015 06/01/20 2049  BP:  106/75 121/68 110/90  Pulse: (!) 42 (!) 43 (!) 42 (!) 50  Resp: 16 18 18 18   Temp:  98.3  F (36.8 C)  98.4 F (36.9 C)  TempSrc:  Oral  Oral  SpO2: 96% 96% 93% 96%  Weight:      Height:        Intake/Output Summary (Last 24 hours) at 06/01/2020 2230 Last data filed at 06/01/2020 2139 Gross per 24 hour  Intake 480 ml  Output 150 ml  Net 330 ml   Filed Weights   06/01/20 1111  Weight: 81.6 kg    Examination: The patient was seen and examined.      Data Reviewed: I have personally reviewed following labs and imaging studies:  CBC: Recent Labs  Lab 06/01/20 0114 06/01/20 0307  WBC 10.4  --   NEUTROABS 3.6  --   HGB 11.8* 11.2*  HCT 40.0 33.0*  MCV 104.2*  --   PLT 323  --    Basic Metabolic Panel: Recent Labs  Lab 06/01/20 0114  06/01/20 0307  NA 144 142  K 4.3 3.5  CL 107  --   CO2 17*  --   GLUCOSE 162*  --   BUN 9  --   CREATININE 1.32*  --   CALCIUM 8.9  --    GFR: Estimated Creatinine Clearance: 49.4 mL/min (A) (by C-G formula based on SCr of 1.32 mg/dL (H)). Liver Function Tests: Recent Labs  Lab 06/01/20 0114  AST 47*  ALT 31  ALKPHOS 151*  BILITOT 1.0  PROT 5.9*  ALBUMIN 3.0*   No results for input(s): LIPASE, AMYLASE in the last 168 hours. No results for input(s): AMMONIA in the last 168 hours. Coagulation Profile: No results for input(s): INR, PROTIME in the last 168 hours. Cardiac Enzymes: No results for input(s): CKTOTAL, CKMB, CKMBINDEX, TROPONINI in the last 168 hours. BNP (last 3 results) No results for input(s): PROBNP in the last 8760 hours. HbA1C: No results for input(s): HGBA1C in the last 72 hours. CBG: No results for input(s): GLUCAP in the last 168 hours. Lipid Profile: No results for input(s): CHOL, HDL, LDLCALC, TRIG, CHOLHDL, LDLDIRECT in the last 72 hours. Thyroid Function Tests: No results for input(s): TSH, T4TOTAL, FREET4, T3FREE, THYROIDAB in the last 72 hours. Anemia Panel: No results for input(s): VITAMINB12, FOLATE, FERRITIN, TIBC, IRON, RETICCTPCT in the last 72 hours. Urine analysis:    Component Value Date/Time   COLORURINE AMBER (A) 03/31/2019 1333   APPEARANCEUR CLEAR 03/31/2019 1333   LABSPEC 1.012 03/31/2019 1333   PHURINE 5.0 03/31/2019 1333   GLUCOSEU NEGATIVE 03/31/2019 1333   HGBUR SMALL (A) 03/31/2019 1333   BILIRUBINUR MODERATE (A) 03/31/2019 1333   KETONESUR NEGATIVE 03/31/2019 1333   PROTEINUR NEGATIVE 03/31/2019 1333   NITRITE NEGATIVE 03/31/2019 1333   LEUKOCYTESUR NEGATIVE 03/31/2019 1333   Sepsis Labs: @LABRCNTIP (procalcitonin:4,lacticacidven:4)  ) Recent Results (from the past 240 hour(s))  SARS Coronavirus 2 by RT PCR (hospital order, performed in [facility] hospital lab) Nasopharyngeal Nasopharyngeal Swab     Status: None    Collection Time: 06/01/20  1:14 AM   Specimen: Nasopharyngeal Swab  Result Value Ref Range Status   SARS Coronavirus 2 NEGATIVE NEGATIVE Final    Comment: (NOTE) SARS-CoV-2 target nucleic acids are NOT DETECTED.  The SARS-CoV-2 RNA is generally detectable in upper and lower respiratory specimens during the acute phase of infection. The lowest concentration of SARS-CoV-2 viral copies this assay can detect is 250 copies / mL. A negative result does not preclude SARS-CoV-2 infection and should not be used as the sole basis for treatment or other patient management  decisions.  A negative result may occur with improper specimen collection / handling, submission of specimen other than nasopharyngeal swab, presence of viral mutation(s) within the areas targeted by this assay, and inadequate number of viral copies (<250 copies / mL). A negative result must be combined with clinical observations, patient history, and epidemiological information.  Fact Sheet for Patients:   StrictlyIdeas.no  Fact Sheet for Healthcare Providers: BankingDealers.co.za  This test is not yet approved or  cleared by the [place] FDA and has been authorized for detection and/or diagnosis of SARS-CoV-2 by FDA under an Emergency Use Authorization (EUA).  This EUA will remain in effect (meaning this test can be used) for the duration of the COVID-19 declaration under Section 564(b)(1) of the Act, 21 U.S.C. section 360bbb-3(b)(1), unless the authorization is terminated or revoked sooner.  Performed at [facility], [place] [address], [place], [place] [address]          Radiology Studies: CT Angio Chest PE W/Cm &/Or Wo Cm  Result Date: 06/01/2020 CLINICAL DATA:  Chest pain, shortness of breath. EXAM: CT ANGIOGRAPHY CHEST WITH CONTRAST TECHNIQUE: Multidetector CT imaging of the chest was performed using the standard protocol during bolus administration of  intravenous contrast. Multiplanar CT image reconstructions and MIPs were obtained to evaluate the vascular anatomy. CONTRAST:  169mL OMNIPAQUE IOHEXOL 350 MG/ML SOLN COMPARISON:  Chest x-ray 06/01/2020 CT chest 04/27/2019. FINDINGS: Cardiovascular: Satisfactory opacification of the pulmonary arteries to the segmental level. No evidence of pulmonary embolism. Enlarged left heart. No significant pericardial effusion. The thoracic aorta is normal in caliber. Mild atherosclerotic plaque of the thoracic aorta. At least 3 vessel moderate to severe coronary artery calcifications. Mediastinum/Nodes: Borderline enlarged 1 cm right paratracheal lymph node. Similarly prominent right hilar lymph node. No enlarged mediastinal, hilar, or axillary lymph nodes. Trace debris within the trachea. Otherwise the thyroid gland, trachea, and esophagus demonstrate no significant findings. Lungs/Pleura: Bilateral trace pleural effusions, left greater than right. Bilateral lower lobe subsegmental and linear atelectasis. Linear atelectasis within the lingula. Diffuse bronchial wall thickening. Centrilobular nodularity within bilateral upper lobes. Upper Abdomen: A splenule is noted.  No acute abnormality. Musculoskeletal: Marked bilateral gynecomastia. Mild subcutaneus soft tissue edema of the right chest wall and slightly asymmetric/hazy right pectoralis muscle. Acute comminuted right humeral head and neck fracture. Interval worsening of a T9 (greater than 75% height loss) and T12 (greater than 50% height loss) compression fracture. Similar-appearing anterior wedge shaped T8 (greater than 75% height loss) compression fracture. Several other vertebral bodies demonstrate vertebral height loss. Old healed bilateral rib fractures. No definite acute displaced rib fracture. Review of the MIP images confirms the above findings. IMPRESSION: 1. No pulmonary embolus. 2. Findings suggestive of bronchiolitis. Followup PA and lateral chest X-ray is  recommended in 3-4 weeks following therapy to ensure resolution and exclude underlying malignancy. 3. Interval development of bilateral, left greater than right trace pleural effusions in the setting left cardiomegaly and three-vessel coronary artery calcifications. 4. Acute comminuted right humeral head and neck fracture. Associated right shoulder cutaneous soft tissue edema as well as right chest wall edema. Recommend dedicated right shoulder radiographs for further evaluation. 5. Interval worsening of T9 and T12 compression fractures. Correlate with point tenderness to palpation to evaluate for an acute component. Stable T8 compression fracture. 6. Aortic Atherosclerosis (ICD10-I70.0). Electronically Signed   By: Iven Finn M.D.   On: 06/01/2020 04:36   DG Chest Portable 1 View  Result Date: 06/01/2020 CLINICAL DATA:  Shortness of breath  EXAM: PORTABLE CHEST 1 VIEW COMPARISON:  12/26/2019 FINDINGS: Cardiac shadow is enlarged in size. Postsurgical changes are noted. Right chest wall port is seen. Mild vascular congestion is noted with interstitial edema. No focal infiltrate or sizable effusion is seen. No bony abnormality is noted. IMPRESSION: Mild changes of CHF. Electronically Signed   By: Inez Catalina M.D.   On: 06/01/2020 01:22   ECHOCARDIOGRAM COMPLETE  Result Date: 06/01/2020    ECHOCARDIOGRAM REPORT   Patient Name:   KERMITH MANTHEY Scalese Date of Exam: 06/01/2020 Medical Rec #:  [id number]      Height:       67.0 in Accession #:    [id number]     Weight:       180.0 lb Date of Birth:  05/02/1945      BSA:          1.934 m Patient Age:    42 years       BP:           102/77 mmHg Patient Gender: M              HR:           76 bpm. Exam Location:  Inpatient Procedure: 2D Echo, Cardiac Doppler, Color Doppler and Intracardiac            Opacification Agent Indications:    R06.02 SOB  History:        Patient has no prior history of Echocardiogram examinations.                 CHF, CAD, Signs/Symptoms:Dyspnea,  Shortness of Breath and Chest                 Pain; Risk Factors:Hypertension and Dyslipidemia. Cancer.  Sonographer:    Roseanna Rainbow RDCS Referring Phys: [id number] TIMOTHY S OPYD  Sonographer Comments: Technically difficult study due to poor echo windows. IMPRESSIONS  1. Left ventricular ejection fraction, by estimation, is <20%. The left ventricle has severely decreased function. The left ventricle demonstrates global hypokinesis. There is mild left ventricular hypertrophy. Left ventricular diastolic parameters are indeterminate.  2. Right ventricular systolic function is moderately reduced. The right ventricular size is mildly enlarged. Tricuspid regurgitation signal is inadequate for assessing PA pressure.  3. Left atrial size was moderately dilated.  4. The mitral valve is normal in structure. Trivial mitral valve regurgitation.  5. The aortic valve is tricuspid. Aortic valve regurgitation is not visualized. Mild to moderate aortic valve sclerosis/calcification is present, without any evidence of aortic stenosis.  6. Aortic dilatation noted. There is mild dilatation of the ascending aorta, measuring 39 mm.  7. The inferior vena cava is dilated in size with <50% respiratory variability, suggesting right atrial pressure of 15 mmHg.  8. There is a filling defect adjacent to anteroapical wall on contrast images (best seen on images 104,105). Suspect this represents prominent LV trabeculation rather than thrombus. Would consider cardiac MRI both to rule out thrombus and evaluate for etiology of cardiomyopathy FINDINGS  Left Ventricle: Left ventricular ejection fraction, by estimation, is <20%. The left ventricle has severely decreased function. The left ventricle demonstrates global hypokinesis. Definity contrast agent was given IV to delineate the left ventricular endocardial borders. The left ventricular internal cavity size was normal in size. There is mild left ventricular hypertrophy. Left ventricular diastolic  parameters are indeterminate. Right Ventricle: The right ventricular size is mildly enlarged. Right vetricular wall thickness was not well visualized. Right ventricular systolic function is moderately  reduced. Tricuspid regurgitation signal is inadequate for assessing PA pressure. Left Atrium: Left atrial size was moderately dilated. Right Atrium: Right atrial size was normal in size. Pericardium: There is no evidence of pericardial effusion. Mitral Valve: The mitral valve is normal in structure. Trivial mitral valve regurgitation. Tricuspid Valve: The tricuspid valve is normal in structure. Tricuspid valve regurgitation is trivial. Aortic Valve: The aortic valve is tricuspid. Aortic valve regurgitation is not visualized. Mild to moderate aortic valve sclerosis/calcification is present, without any evidence of aortic stenosis. Pulmonic Valve: The pulmonic valve was not well visualized. Pulmonic valve regurgitation is not visualized. Aorta: The aortic root is normal in size and structure and aortic dilatation noted. There is mild dilatation of the ascending aorta, measuring 39 mm. Venous: The inferior vena cava is dilated in size with less than 50% respiratory variability, suggesting right atrial pressure of 15 mmHg. IAS/Shunts: The interatrial septum was not well visualized.  LEFT VENTRICLE PLAX 2D LVIDd:         5.60 cm LVIDs:         5.40 cm LV PW:         1.40 cm LV IVS:        1.30 cm LVOT diam:     1.90 cm LV SV:         52 LV SV Index:   27 LVOT Area:     2.84 cm  LV Volumes (MOD) LV vol d, MOD A2C: 212.0 ml LV vol d, MOD A4C: 265.0 ml LV vol s, MOD A2C: 176.0 ml LV vol s, MOD A4C: 215.0 ml LV SV MOD A2C:     36.0 ml LV SV MOD A4C:     265.0 ml LV SV MOD BP:      48.5 ml RIGHT VENTRICLE            IVC RV S prime:     3.92 cm/s  IVC diam: 2.70 cm TAPSE (M-mode): 1.2 cm LEFT ATRIUM              Index       RIGHT ATRIUM           Index LA diam:        4.80 cm  2.48 cm/m  RA Area:     16.90 cm LA Vol (A2C):    51.9 ml  26.84 ml/m RA Volume:   46.20 ml  23.89 ml/m LA Vol (A4C):   111.0 ml 57.40 ml/m LA Biplane Vol: 84.8 ml  43.85 ml/m  AORTIC VALVE LVOT Vmax:   121.00 cm/s LVOT Vmean:  74.400 cm/s LVOT VTI:    0.182 m  AORTA Ao Root diam: 3.45 cm Ao Asc diam:  3.90 cm MITRAL VALVE MV Area (PHT): 4.85 cm    SHUNTS MV Decel Time: 157 msec    Systemic VTI:  0.18 m MV E velocity: 90.00 cm/s  Systemic Diam: 1.90 cm Oswaldo Milian MD Electronically signed by Oswaldo Milian MD Signature Date/Time: 06/01/2020/9:43:52 PM    Final         Scheduled Meds: . aspirin EC  81 mg Oral Daily  . atorvastatin  80 mg Oral Daily  . furosemide  40 mg Intravenous BID  . heparin  5,000 Units Subcutaneous Q8H  . isosorbide mononitrate  30 mg Oral Daily  . pantoprazole  40 mg Oral Daily   Continuous Infusions: . sodium chloride       LOS: 0 days    Time spent: 20 minutes  Edwin Dada, MD [facility] 06/01/2020, 10:30 PM     Please page though [employer] or Epic secure chat:  For password, contact charge nurse

## 2020-06-01 NOTE — Progress Notes (Addendum)
ANTICOAGULATION CONSULT NOTE - Initial Consult  Pharmacy Consult for Heparin Indication: Rule out PE vs NSTEMI  No Known Allergies  Patient Measurements: ~85 kg  Vital Signs: Temp: 98.5 F (36.9 C) (02/02 0055) Temp Source: Oral (02/02 0055) BP: 111/82 (02/02 0256) Pulse Rate: 80 (02/02 0256)  Labs: Recent Labs    06/01/20 0114 06/01/20 0307  HGB 11.8* 11.2*  HCT 40.0 33.0*  PLT 323  --   CREATININE 1.32*  --   TROPONINIHS 87*  --     CrCl cannot be calculated (Unknown ideal weight.).   Medical History: Past Medical History:  Diagnosis Date  . Arthritis    "hands, fingers" (02/03/2013)  . Blood dyscrasia    "told I was a free bleeder when I was young; my blood does have trouble clotting" (02/03/2013)  . Cancer ([place])   . Coronary artery disease    s/p CABG  . GERD (gastroesophageal reflux disease)   . Hyperlipidemia   . Hypertension   . Myocardial infarct ([place]) 2000  . Pneumonia    "couple times" (02/03/2013)    Assessment: 75 y/o M presents to the ED with shortness of breath, troponin slightly elevated, recent fall a few weeks ago with broke arm, history of pancreatic CA s/p whipple, Hgb 11.2. mild bump in Scr, PTA meds reviewed.   Goal of Therapy:  Heparin level 0.3-0.7 units/ml Monitor platelets by anticoagulation protocol: Yes   Plan:  Heparin 5000 units BOLUS Start heparin drip at 1200 units/hr 1200 Heparin level Daily CBC/Heparin level Monitor for bleeding F/U any imaging for PE evaluation   Narda Bonds, PharmD, BCPS Clinical Pharmacist Phone: [phone]

## 2020-06-01 NOTE — Progress Notes (Signed)
RT n ote. Pt. taken off BIPAP  Per abg results, pt. On rm air, VSS RT will continue to [employer]

## 2020-06-01 NOTE — Progress Notes (Signed)
Patients troponin still elevated, even more  Now- 1664. MD notified.

## 2020-06-01 NOTE — H&P (Signed)
History and Physical    JACQUELINE DELAPENA VWU:[id number] DOB: 04-27-1946 DOA: 06/01/2020  PCP: [facility]   Patient coming from: Home   Chief Complaint: Acute-onset SOB   HPI: Isaiah Patterson is a 75 y.o. male with medical history significant for CAD status post remote CABG, pancreatic adenocarcinoma status post Whipple procedure with recent evidence for recurrence, fall 2 weeks ago with thoracic compression and proximal right humerus fractures managed conservatively, now presenting to the emergency department for evaluation of acute onset shortness of breath.  Patient is accompanied by his fiance who assists with the history.  He reports having a normal day on 05/31/2020 until shortly after lying down to sleep when he developed acute onset shortness of breath.  He was noted to be diaphoretic and pale with labored respirations.  Patient describes feeling as though he was going to die.  He denies associated nausea or chest pain.  Reports that he has had some mild exertional dyspnea with cough productive of scant white sputum for approximately 1 month but denies any fevers or chills.  Patient reports some mild bilateral lower leg swelling in the past month.    He had slipped on some ice on 05/18/2020, developed severe pain in his proximal right arm and back, was found to have thoracic vertebral compression fractures and right humeral head and neck fracture which has been managed conservatively by [facility] in [place].  For pancreatic adenocarcinoma, he had Whipple procedure in January 2021, completed chemotherapy in October, is followed by oncology at the [facility] and did not have any evidence for recurrence until a recent scan and is now scheduled for consultation with radiation oncologist next week.  ED Course: Upon arrival to the ED, patient is found to be afebrile, in acute respiratory distress with pallor, diaphoresis, and labored breathing, tachycardic in the 140s, and with  stable blood pressure.  EKG features sinus tachycardia with rate 140 and PVCs.  Chest x-ray findings suggestive of mild CHF.  Chemistry panel notable for bicarbonate of 17, creatinine 1.32, and glucose 162.  BNP elevated to 3690.  High-sensitivity troponin was 87.  Covid PCR was negative.  Patient was started on BiPAP and given 40 mg IV Lasix in the ED.  CTA chest was negative for PE but notable for bronchiolitis and interval development of trace bilateral pleural effusions with left cardiomegaly.  Patient began to diurese, respiratory status improved dramatically, and he was taken off of BiPAP.  Review of Systems:  All other systems reviewed and apart from HPI, are negative.  Past Medical History:  Diagnosis Date  . Arthritis    "hands, fingers" (02/03/2013)  . Blood dyscrasia    "told I was a free bleeder when I was young; my blood does have trouble clotting" (02/03/2013)  . Cancer ([place])   . Coronary artery disease    s/p CABG  . GERD (gastroesophageal reflux disease)   . Hyperlipidemia   . Hypertension   . Myocardial infarct ([place]) 2000  . Pneumonia    "couple times" (02/03/2013)    Past Surgical History:  Procedure Laterality Date  . BILIARY DILATION  04/02/2019   Procedure: BILIARY DILATION;  Surgeon: Arta Silence, MD;  Location: [facility] ENDOSCOPY;  Service: Endoscopy;;  . BILIARY STENT PLACEMENT  04/02/2019   Procedure: BILIARY STENT PLACEMENT;  Surgeon: Arta Silence, MD;  Location: [place] ENDOSCOPY;  Service: Endoscopy;;  . CORONARY ARTERY BYPASS GRAFT  2000   "CABG X5" (02/03/2013)  . ERCP N/A 04/02/2019  Procedure: ENDOSCOPIC RETROGRADE CHOLANGIOPANCREATOGRAPHY (ERCP);  Surgeon: Arta Silence, MD;  Location: [facility] ENDOSCOPY;  Service: Endoscopy;  Laterality: N/A;  . ESOPHAGOGASTRODUODENOSCOPY (EGD) WITH PROPOFOL N/A 04/02/2019   Procedure: ESOPHAGOGASTRODUODENOSCOPY (EGD) WITH PROPOFOL;  Surgeon: Arta Silence, MD;  Location: [facility];  Service: Endoscopy;  Laterality: N/A;  .  EUS N/A 04/02/2019   Procedure: UPPER ENDOSCOPIC ULTRASOUND (EUS) LINEAR;  Surgeon: Arta Silence, MD;  Location: [place];  Service: Endoscopy;  Laterality: N/A;  . FINE NEEDLE ASPIRATION  04/02/2019   Procedure: FINE NEEDLE ASPIRATION (FNA) LINEAR;  Surgeon: Arta Silence, MD;  Location: [place] ENDOSCOPY;  Service: Endoscopy;;  . LEFT HEART CATHETERIZATION WITH CORONARY/GRAFT ANGIOGRAM N/A 02/04/2013   Procedure: LEFT HEART CATHETERIZATION WITH Beatrix Fetters;  Surgeon: Candee Furbish, MD;  Location: [facility] CATH LAB;  Service: Cardiovascular;  Laterality: N/A;  . PANCREATIC STENT PLACEMENT  04/02/2019   Procedure: PANCREATIC STENT PLACEMENT;  Surgeon: Arta Silence, MD;  Location: [facility] ENDOSCOPY;  Service: Endoscopy;;  . SPHINCTEROTOMY  04/02/2019   Procedure: SPHINCTEROTOMY;  Surgeon: Arta Silence, MD;  Location: [place] ENDOSCOPY;  Service: Endoscopy;;  . TONSILLECTOMY  1950's    Social History:   reports that he has quit smoking. His smoking use included cigarettes. He has a 25.00 pack-year smoking history. He has never used smokeless tobacco. He reports previous alcohol use. He reports that he does not use drugs.  No Known Allergies  Family History  Problem Relation Age of Onset  . Heart disease Mother   . Heart disease Father      Prior to Admission medications   Medication Sig Start Date End Date Taking? Authorizing Provider  aspirin EC 81 MG tablet Take 1 tablet (81 mg total) by mouth daily. 02/04/13   Jerline Pain, MD  famotidine (PEPCID) 20 MG tablet Take 20 mg by mouth 2 (two) times daily.    [provider]  isosorbide mononitrate (IMDUR) 30 MG 24 hr tablet Take 1 tablet (30 mg total) by mouth daily. 02/04/13   Jerline Pain, MD  lisinopril (PRINIVIL,ZESTRIL) 20 MG tablet Take 20 mg by mouth daily.  11/17/12   [provider]  Multiple Vitamin (MULTIVITAMIN WITH MINERALS) TABS tablet Take 1 tablet by mouth daily.    [provider]  nicotine  (NICODERM CQ - DOSED IN MG/24 HOURS) 21 mg/24hr patch Place 21 mg onto the skin daily as needed (smoking cession).     [provider]  nitroGLYCERIN (NITROSTAT) 0.4 MG SL tablet Place 1 tablet (0.4 mg total) under the tongue every 5 (five) minutes as needed for chest pain. 02/04/13   Jerline Pain, MD  omeprazole (PRILOSEC) 40 MG capsule Take 40 mg by mouth daily. 11/22/12   [provider]  pantoprazole (PROTONIX) 40 MG tablet Take 40 mg by mouth daily.    [provider]  sertraline (ZOLOFT) 100 MG tablet Take 200 mg by mouth daily.    [provider]  traMADol (ULTRAM) 50 MG tablet Take 1 tablet (50 mg total) by mouth every 6 (six) hours as needed for moderate pain. 04/04/19   Erline Hau, MD    Physical Exam: Vitals:   06/01/20 0151 06/01/20 0200 06/01/20 0256 06/01/20 0332  BP:  113/76 111/82   Pulse:  (!) 144 80   Resp: (!) 22 (!) 24 19   Temp:      TempSrc:      SpO2:  97% 97% 95%    Constitutional: NAD, calm  Eyes: PERTLA, lids  and conjunctivae normal ENMT: Mucous membranes are moist. Posterior pharynx clear of any exudate or lesions.   Neck: normal, supple, no masses, no thyromegaly Respiratory: Mild tachypnea, no wheezing, fine rales bilaterally. No pallor or cyanosis.  Cardiovascular: S1 & S2 heard, regular rate and rhythm. No extremity edema.   Abdomen: No distension, no tenderness, soft. Bowel sounds active.  Musculoskeletal: no clubbing / cyanosis. No joint deformity upper and lower extremities.   Skin: no significant rashes, lesions, ulcers. Warm, dry, well-perfused. Neurologic: CN 2-12 grossly intact. Sensation intact. Moving all extremities.  Psychiatric: Alert and oriented to person, place, and situation. Very pleasant and cooperative.    Labs and Imaging on Admission: I have personally reviewed following labs and imaging studies  CBC: Recent Labs  Lab 06/01/20 0114 06/01/20 0307  WBC 10.4  --   NEUTROABS 3.6   --   HGB 11.8* 11.2*  HCT 40.0 33.0*  MCV 104.2*  --   PLT 323  --    Basic Metabolic Panel: Recent Labs  Lab 06/01/20 0114 06/01/20 0307  NA 144 142  K 4.3 3.5  CL 107  --   CO2 17*  --   GLUCOSE 162*  --   BUN 9  --   CREATININE 1.32*  --   CALCIUM 8.9  --    GFR: CrCl cannot be calculated (Unknown ideal weight.). Liver Function Tests: Recent Labs  Lab 06/01/20 0114  AST 47*  ALT 31  ALKPHOS 151*  BILITOT 1.0  PROT 5.9*  ALBUMIN 3.0*   No results for input(s): LIPASE, AMYLASE in the last 168 hours. No results for input(s): AMMONIA in the last 168 hours. Coagulation Profile: No results for input(s): INR, PROTIME in the last 168 hours. Cardiac Enzymes: No results for input(s): CKTOTAL, CKMB, CKMBINDEX, TROPONINI in the last 168 hours. BNP (last 3 results) No results for input(s): PROBNP in the last 8760 hours. HbA1C: No results for input(s): HGBA1C in the last 72 hours. CBG: No results for input(s): GLUCAP in the last 168 hours. Lipid Profile: No results for input(s): CHOL, HDL, LDLCALC, TRIG, CHOLHDL, LDLDIRECT in the last 72 hours. Thyroid Function Tests: No results for input(s): TSH, T4TOTAL, FREET4, T3FREE, THYROIDAB in the last 72 hours. Anemia Panel: No results for input(s): VITAMINB12, FOLATE, FERRITIN, TIBC, IRON, RETICCTPCT in the last 72 hours. Urine analysis:    Component Value Date/Time   COLORURINE AMBER (A) 03/31/2019 1333   APPEARANCEUR CLEAR 03/31/2019 1333   LABSPEC 1.012 03/31/2019 1333   PHURINE 5.0 03/31/2019 1333   GLUCOSEU NEGATIVE 03/31/2019 1333   HGBUR SMALL (A) 03/31/2019 1333   BILIRUBINUR MODERATE (A) 03/31/2019 1333   KETONESUR NEGATIVE 03/31/2019 1333   PROTEINUR NEGATIVE 03/31/2019 1333   NITRITE NEGATIVE 03/31/2019 1333   LEUKOCYTESUR NEGATIVE 03/31/2019 1333   Sepsis Labs: @LABRCNTIP (procalcitonin:4,lacticidven:4) ) Recent Results (from the past 240 hour(s))  SARS Coronavirus 2 by RT PCR (hospital order, performed  in [facility] hospital lab) Nasopharyngeal Nasopharyngeal Swab     Status: None   Collection Time: 06/01/20  1:14 AM   Specimen: Nasopharyngeal Swab  Result Value Ref Range Status   SARS Coronavirus 2 NEGATIVE NEGATIVE Final    Comment: (NOTE) SARS-CoV-2 target nucleic acids are NOT DETECTED.  The SARS-CoV-2 RNA is generally detectable in upper and lower respiratory specimens during the acute phase of infection. The lowest concentration of SARS-CoV-2 viral copies this assay can detect is 250 copies / mL. A negative result does not preclude SARS-CoV-2 infection and should  not be used as the sole basis for treatment or other patient management decisions.  A negative result may occur with improper specimen collection / handling, submission of specimen other than nasopharyngeal swab, presence of viral mutation(s) within the areas targeted by this assay, and inadequate number of viral copies (<250 copies / mL). A negative result must be combined with clinical observations, patient history, and epidemiological information.  Fact Sheet for Patients:   BoilerBrush.com.cy  Fact Sheet for Healthcare Providers: https://pope.com/  This test is not yet approved or  cleared by the [place] FDA and has been authorized for detection and/or diagnosis of SARS-CoV-2 by FDA under an Emergency Use Authorization (EUA).  This EUA will remain in effect (meaning this test can be used) for the duration of the COVID-19 declaration under Section 564(b)(1) of the Act, 21 U.S.C. section 360bbb-3(b)(1), unless the authorization is terminated or revoked sooner.  Performed at [facility], [address], [place], [place] [address]      Radiological Exams on Admission: CT Angio Chest PE W/Cm &/Or Wo Cm  Result Date: 06/01/2020 CLINICAL DATA:  Chest pain, shortness of breath. EXAM: CT ANGIOGRAPHY CHEST WITH CONTRAST TECHNIQUE: Multidetector CT imaging  of the chest was performed using the standard protocol during bolus administration of intravenous contrast. Multiplanar CT image reconstructions and MIPs were obtained to evaluate the vascular anatomy. CONTRAST:  OMNIPAQUE IOHEXOL 350 MG/ML SOLN COMPARISON:  Chest x-ray 06/01/2020 CT chest 04/27/2019. FINDINGS: Cardiovascular: Satisfactory opacification of the pulmonary arteries to the segmental level. No evidence of pulmonary embolism. Enlarged left heart. No significant pericardial effusion. The thoracic aorta is normal in caliber. Mild atherosclerotic plaque of the thoracic aorta. At least 3 vessel moderate to severe coronary artery calcifications. Mediastinum/Nodes: Borderline enlarged 1 cm right paratracheal lymph node. Similarly prominent right hilar lymph node. No enlarged mediastinal, hilar, or axillary lymph nodes. Trace debris within the trachea. Otherwise the thyroid gland, trachea, and esophagus demonstrate no significant findings. Lungs/Pleura: Bilateral trace pleural effusions, left greater than right. Bilateral lower lobe subsegmental and linear atelectasis. Linear atelectasis within the lingula. Diffuse bronchial wall thickening. Centrilobular nodularity within bilateral upper lobes. Upper Abdomen: A splenule is noted.  No acute abnormality. Musculoskeletal: Marked bilateral gynecomastia. Mild subcutaneus soft tissue edema of the right chest wall and slightly asymmetric/hazy right pectoralis muscle. Acute comminuted right humeral head and neck fracture. Interval worsening of a T9 (greater than 75% height loss) and T12 (greater than 50% height loss) compression fracture. Similar-appearing anterior wedge shaped T8 (greater than 75% height loss) compression fracture. Several other vertebral bodies demonstrate vertebral height loss. Old healed bilateral rib fractures. No definite acute displaced rib fracture. Review of the MIP images confirms the above findings. IMPRESSION: 1. No pulmonary embolus.  2. Findings suggestive of bronchiolitis. Followup PA and lateral chest X-ray is recommended in 3-4 weeks following therapy to ensure resolution and exclude underlying malignancy. 3. Interval development of bilateral, left greater than right trace pleural effusions in the setting left cardiomegaly and three-vessel coronary artery calcifications. 4. Acute comminuted right humeral head and neck fracture. Associated right shoulder cutaneous soft tissue edema as well as right chest wall edema. Recommend dedicated right shoulder radiographs for further evaluation. 5. Interval worsening of T9 and T12 compression fractures. Correlate with point tenderness to palpation to evaluate for an acute component. Stable T8 compression fracture. 6. Aortic Atherosclerosis (ICD10-I70.0). Electronically Signed   By: Tish Frederickson M.D.   On: 06/01/2020 04:36   DG Chest Portable 1  View  Result Date: 06/01/2020 CLINICAL DATA:  Shortness of breath EXAM: PORTABLE CHEST 1 VIEW COMPARISON:  12/26/2019 FINDINGS: Cardiac shadow is enlarged in size. Postsurgical changes are noted. Right chest wall port is seen. Mild vascular congestion is noted with interstitial edema. No focal infiltrate or sizable effusion is seen. No bony abnormality is noted. IMPRESSION: Mild changes of CHF. Electronically Signed   By: Inez Catalina M.D.   On: 06/01/2020 01:22    EKG: Independently reviewed. Sinus tachycardia, rate 140, PVCs.   Assessment/Plan   1. Acute CHF; acute hypoxic respiratory failure  - Presents with acute-onset SOB and diaphoresis that developed when he lay down, was in acute distress in ED requiring BiPAP initially  - BNP is 3690, CHF findings on CXR  - He is diuresing after 40 mg IV Lasix, respiratory status improved  - Plan to continue diuresis with Lasix 40 mg IV q12h, check echocardiogram, follow weights and I/Os, monitor renal function and electrolytes   2. Elevated troponin; CAD   - History of CAD s/p CABG and subsequent  cardiac cath in 2014 without intervention who presents with acute-onset respiratory distress with diaphoresis and pallor but no chest pain  - HS troponin 87 then 238, EKG with sinus tachycardia rate 140 and PVCs  - PE ruled-out with CTA in ED  - Discussed with cardiology fellow, plan to trend troponin, check echocardiogram, continue ASA and statin    3. Mild renal insufficiency  - SCr is 1.32 on admission, up from 0.84 in August 2021  - Monitor closely while diuresing   4. Pancreatic cancer  - Status-post Whipple in January 2021, completed chemotherapy in October 2021  - Follows with hematology-oncology at [facility] and unfortunately had evidence for recurrence on recent scan   - He is scheduled for first consultation with radiation oncology next week    5. Right proximal humerus fracture; thoracic compression fractures  - Patient fell on 1/19 and suffered compression fractures of multiple thoracic vertebrae and comminuted right humeral head and neck fracture  - He is following with [facility] in [place], being managed conservatively  - Continue sling, pain-control, orthopedic follow-up after discharge    DVT prophylaxis: sq heparin  Code Status: Full, confirmed with patient on admission  Level of Care: Level of care: Telemetry Cardiac Family Communication: Fiance updated at bedside Disposition Plan:  Patient is from: Home  Anticipated d/c is to: TBD Anticipated d/c date is: 06/04/20 Patient currently: Pending echocardiogram, repeat troponin, improvement in respiratory status   Consults called: Cardiology  Admission status: Inpatient     Isaiah Bulls, MD [facility]  06/01/2020, 5:39 AM

## 2020-06-01 NOTE — ED Notes (Signed)
Patient transported to CT 

## 2020-06-01 NOTE — ED Triage Notes (Signed)
Pt brought in by his wife, says the pt has been c/o shortness of breath tonight. Pt had recent fall last week in the ice- has a broke upper arm- bruising to the chest. Saturations 87% on ra on arrival.  Pt is diaphoretic, pale, restless.

## 2020-06-01 NOTE — Consult Note (Signed)
Cardiology Consultation:   Patient ID: Isaiah Patterson; [id number]; Jul 04, 1945   Admit date: 06/01/2020 Date of Consult: 06/01/2020  Primary Care Provider: [facility], [facility] Primary Cardiologist: New to [facility]-remotely seen by Dr. Marlou Porch 2015  Patient Profile:   Isaiah Patterson is a 75 y.o. male with a hx of CAD s/p CABG 02/03/2013 with last [place] 2014 which showed atretic free RIMA to RCA, patent SVG to diagonal, patent SVG to ramus/OM, patent LIMA to LAD with normal LVEF, HTN, HLD, pancreatic adenocarcinoma s/p Whipple procedure with recent evidence for recurrence, and recent fall with thoracic compression and proximal right humerus fractures managed conservatively who is being seen today for the evaluation of elevated troponin at the request of Dr. Hal Hope.  History of Present Illness:   Isaiah Patterson is a 75yo M with a hx as stated above who presented to [facility] with acute onset of SOB which began yesterday evening while attempting to go to sleep. He states that he has been having mild DOE for the last several months with ambulation and walking up stairs however contributed his symptoms to chemotherapy. He also reports intermittent LE edema which he also contributed as a side effect from the chemotherapy medications. He states that he was trying to go to sleep the evening of hospital arrival at he could not catch his breath. He states his symptoms were worse with laying down. He denies chest pain, palpitations, dizziness, recent illness with fever or chills, or syncope. He reports that he receives his cardiology care from the [place]. He states that since his last cath in 2014 he has not had any anginal symptoms. He reports he hjas undergone a routine stress test after his cath that was normal. He also thinks that he recently had an echo that showed an EF at 55% although it sounds like this was performed prior to his symptoms.   In the ED, he was in acute respiratory distress with pallor, diaphoresis and  labored breathing. He was tachycardic in the 140's. EKG showed AT with PVCs and no acute changes. BNP elevated at 3690 with CXR with mild CHF. Cr found to be 1.32. HsT found to be 87>>238>>>573. Initial EKG with ST and PAC however repeat EKG from 0330 on 06/01/20 with new TWI in leads I, AvL and questionable STE in lead III. PCR COVID testing is negative. He was started on Bipap and was given IV Lasix 40mg . CTA with no PE   He recently slipped on ice 05/18/20 and developed severe pain in his right arm found to have thoracic vertebral compression fractures and right humeral head fracture which has been managed conservatively by [facility] in [place]. In regards to his pancreatic cancer, he initally was dx in 2021 with whipple procedure and chemotherapy. He is followed with oncology through the [place]. He recently underwent routine scanning which showed evidence of recurrence.  He was remotely followed by Dr. Marlou Porch in 2014 after hospital consultation for chest pain at which time he underwent LHC which showed atretic free RIMA to RCA, patent SVG to diagonal, patent SVG to ramus/OM, patent LIMA to LAD with normal LVEF.   Past Medical History:  Diagnosis Date  . Arthritis    "hands, fingers" (02/03/2013)  . Blood dyscrasia    "told I was a free bleeder when I was young; my blood does have trouble clotting" (02/03/2013)  . Cancer ([place])   . Coronary artery disease    s/p CABG  . GERD (gastroesophageal reflux  disease)   . Hyperlipidemia   . Hypertension   . Myocardial infarct ([place]) 2000  . Pneumonia    "couple times" (02/03/2013)    Past Surgical History:  Procedure Laterality Date  . BILIARY DILATION  04/02/2019   Procedure: BILIARY DILATION;  Surgeon: Arta Silence, MD;  Location: [facility] ENDOSCOPY;  Service: Endoscopy;;  . BILIARY STENT PLACEMENT  04/02/2019   Procedure: BILIARY STENT PLACEMENT;  Surgeon: Arta Silence, MD;  Location: [place] ENDOSCOPY;  Service: Endoscopy;;  . CORONARY  ARTERY BYPASS GRAFT  2000   "CABG X5" (02/03/2013)  . ERCP N/A 04/02/2019   Procedure: ENDOSCOPIC RETROGRADE CHOLANGIOPANCREATOGRAPHY (ERCP);  Surgeon: Arta Silence, MD;  Location: [facility] ENDOSCOPY;  Service: Endoscopy;  Laterality: N/A;  . ESOPHAGOGASTRODUODENOSCOPY (EGD) WITH PROPOFOL N/A 04/02/2019   Procedure: ESOPHAGOGASTRODUODENOSCOPY (EGD) WITH PROPOFOL;  Surgeon: Arta Silence, MD;  Location: [facility];  Service: Endoscopy;  Laterality: N/A;  . EUS N/A 04/02/2019   Procedure: UPPER ENDOSCOPIC ULTRASOUND (EUS) LINEAR;  Surgeon: Arta Silence, MD;  Location: [place];  Service: Endoscopy;  Laterality: N/A;  . FINE NEEDLE ASPIRATION  04/02/2019   Procedure: FINE NEEDLE ASPIRATION (FNA) LINEAR;  Surgeon: Arta Silence, MD;  Location: [place] ENDOSCOPY;  Service: Endoscopy;;  . LEFT HEART CATHETERIZATION WITH CORONARY/GRAFT ANGIOGRAM N/A 02/04/2013   Procedure: LEFT HEART CATHETERIZATION WITH Beatrix Fetters;  Surgeon: Candee Furbish, MD;  Location: [facility] CATH LAB;  Service: Cardiovascular;  Laterality: N/A;  . PANCREATIC STENT PLACEMENT  04/02/2019   Procedure: PANCREATIC STENT PLACEMENT;  Surgeon: Arta Silence, MD;  Location: [facility] ENDOSCOPY;  Service: Endoscopy;;  . SPHINCTEROTOMY  04/02/2019   Procedure: SPHINCTEROTOMY;  Surgeon: Arta Silence, MD;  Location: [place] [facility] ENDOSCOPY;  Service: Endoscopy;;  . TONSILLECTOMY  1950's     Prior to Admission medications   Medication Sig Start Date End Date Taking? Authorizing Provider  aspirin EC 81 MG tablet Take 1 tablet (81 mg total) by mouth daily. 02/04/13   Jerline Pain, MD  famotidine (PEPCID) 20 MG tablet Take 20 mg by mouth 2 (two) times daily.    [provider]  isosorbide mononitrate (IMDUR) 30 MG 24 hr tablet Take 1 tablet (30 mg total) by mouth daily. 02/04/13   Jerline Pain, MD  lisinopril (PRINIVIL,ZESTRIL) 20 MG tablet Take 20 mg by mouth daily.  11/17/12   [provider]  Multiple Vitamin (MULTIVITAMIN WITH  MINERALS) TABS tablet Take 1 tablet by mouth daily.    [provider]  nicotine (NICODERM CQ - DOSED IN MG/24 HOURS) 21 mg/24hr patch Place 21 mg onto the skin daily as needed (smoking cession).     [provider]  nitroGLYCERIN (NITROSTAT) 0.4 MG SL tablet Place 1 tablet (0.4 mg total) under the tongue every 5 (five) minutes as needed for chest pain. 02/04/13   Jerline Pain, MD  omeprazole (PRILOSEC) 40 MG capsule Take 40 mg by mouth daily. 11/22/12   [provider]  pantoprazole (PROTONIX) 40 MG tablet Take 40 mg by mouth daily.    [provider]  sertraline (ZOLOFT) 100 MG tablet Take 200 mg by mouth daily.    [provider]  traMADol (ULTRAM) 50 MG tablet Take 1 tablet (50 mg total) by mouth every 6 (six) hours as needed for moderate pain. 04/04/19   Erline Hau, MD    Inpatient Medications: Scheduled Meds: . aspirin EC  81 mg Oral Daily  . atorvastatin  80 mg Oral Daily  . furosemide  40 mg Intravenous BID  .  heparin  5,000 Units Subcutaneous Q8H  . isosorbide mononitrate  30 mg Oral Daily  . pantoprazole  40 mg Oral Daily   Continuous Infusions: . sodium chloride     PRN Meds: sodium chloride, acetaminophen, morphine injection, ondansetron (ZOFRAN) IV, ondansetron (ZOFRAN) IV  Allergies:   No Known Allergies  Social History:   Social History   Socioeconomic History  . Marital status: Divorced    Spouse name: Not on file  . Number of children: Not on file  . Years of education: Not on file  . Highest education level: Not on file  Occupational History  . Not on file  Tobacco Use  . Smoking status: Former Smoker    Packs/day: 1.00    Years: 25.00    Pack years: 25.00    Types: Cigarettes  . Smokeless tobacco: Never Used  . Tobacco comment: 02/03/2013 "quit smoking ~ 25 yr ago"  Substance and Sexual Activity  . Alcohol use: Not Currently    Comment: 02/03/2013 "stopped drinking alcohol in the 1990's; never  had problem w/it"  . Drug use: No  . Sexual activity: Yes  Other Topics Concern  . Not on file  Social History Narrative  . Not on file   Social Determinants of Health   Financial Resource Strain: Not on file  Food Insecurity: Not on file  Transportation Needs: Not on file  Physical Activity: Not on file  Stress: Not on file  Social Connections: Not on file  Intimate Partner Violence: Not on file    Family History:   Family History  Problem Relation Age of Onset  . Heart disease Mother   . Heart disease Father    Family Status:  Family Status  Relation Name Status  . Mother  Deceased  . Father  Deceased    ROS:  Please see the history of present illness.  All other ROS reviewed and negative.     Physical Exam/Data:   Vitals:   06/01/20 0200 06/01/20 0256 06/01/20 0332 06/01/20 0645  BP: 113/76 111/82  116/81  Pulse: (!) 144 80  73  Resp: (!) 24 19  18   Temp:      TempSrc:      SpO2: 97% 97% 95% 93%   No intake or output data in the 24 hours ending 06/01/20 0732 There were no vitals filed for this visit. There is no height or weight on file to calculate BMI.   General: Well developed, well nourished, NAD Lungs:Clear to ausculation bilaterally. No wheezes, rales, or rhonchi. Breathing is unlabored. Cardiovascular: RRR with S1 S2. No murmurs Abdomen: Soft, non-tender, non-distended. No obvious abdominal masses. Extremities: No edema. Radial pulses 2+ bilaterally Neuro: Alert and oriented. No focal deficits. No facial asymmetry. MAE spontaneously. Psych: Responds to questions appropriately with normal affect.    EKG:  The EKG was personally reviewed and demonstrates:  06/01/20 NSR with PACs, TWI in leads I, AVL and questionable STE in lead III Telemetry:  Telemetry was personally reviewed and demonstrates:  06/01/20 NSR with frequent PACS  Relevant CV Studies:  ECHO: Pending this admission   CATH: 02/04/2013:  ANGIOGRAPHIC DATA:    Left main: Widely patent  branching into LAD and circumflex.  Left anterior descending (LAD): Occluded proximally, filling via the LIMA to LAD graft which is widely patent.  Circumflex artery (CIRC): Occluded in its midportion. SVG to ramus/OM graft widely patent. Native circumflex is ectatic.  Right coronary artery (RCA): Diffusely disease/calcified however no flow limiting stenosis  identified. Gives rise to the PDA. Free RIMA to RCA graft is atretic.  LEFT VENTRICULOGRAM:  Left ventricular angiogram was done in the 30 RAO projection and revealed normal left ventricular wall motion and systolic function with an estimated ejection fraction of 55%.   IMPRESSIONS:  1. Severe native vessel coronary artery disease with patent I passed grafts except for atretic free RIMA to RCA 2. Normal left ventricular systolic function.  LVEDP 19 mmHg.  Ejection fraction 55%.  RECOMMENDATION:  Continue with aggressive medical management. It is likely that the isolated troponin was spurious. It is my doubt now that this represented true acute coronary syndrome. We will see back in clinic in 10-14 days. I feel comfortable allowing him to be discharged this evening if doing well postoperatively.  Laboratory Data:  Chemistry Recent Labs  Lab 06/01/20 0114 06/01/20 0307  NA 144 142  K 4.3 3.5  CL 107  --   CO2 17*  --   GLUCOSE 162*  --   BUN 9  --   CREATININE 1.32*  --   CALCIUM 8.9  --   GFRNONAA 56*  --   ANIONGAP 20*  --     Total Protein  Date Value Ref Range Status  06/01/2020 5.9 (L) 6.5 - 8.1 g/dL Final   Albumin  Date Value Ref Range Status  06/01/2020 3.0 (L) 3.5 - 5.0 g/dL Final   AST  Date Value Ref Range Status  06/01/2020 47 (H) 15 - 41 U/L Final   ALT  Date Value Ref Range Status  06/01/2020 31 0 - 44 U/L Final   Alkaline Phosphatase  Date Value Ref Range Status  06/01/2020 151 (H) 38 - 126 U/L Final   Total Bilirubin  Date Value Ref Range Status  06/01/2020 1.0 0.3 - 1.2 mg/dL Final    Hematology Recent Labs  Lab 06/01/20 0114 06/01/20 0307  WBC 10.4  --   RBC 3.84*  --   HGB 11.8* 11.2*  HCT 40.0 33.0*  MCV 104.2*  --   MCH 30.7  --   MCHC 29.5*  --   RDW 14.4  --   PLT 323  --    Cardiac EnzymesNo results for input(s): TROPONINI in the last 168 hours. No results for input(s): TROPIPOC in the last 168 hours.  BNP Recent Labs  Lab 06/01/20 0114  BNP 3,690.0*    DDimer No results for input(s): DDIMER in the last 168 hours. TSH:  Lab Results  Component Value Date   TSH 3.477 02/03/2013   Lipids: Lab Results  Component Value Date   CHOL 157 02/04/2013   HDL 53 02/04/2013   LDLCALC 74 02/04/2013   TRIG 151 (H) 02/04/2013   CHOLHDL 3.0 02/04/2013   HgbA1c:No results found for: HGBA1C  Radiology/Studies:  CT Angio Chest PE W/Cm &/Or Wo Cm  Result Date: 06/01/2020 CLINICAL DATA:  Chest pain, shortness of breath. EXAM: CT ANGIOGRAPHY CHEST WITH CONTRAST TECHNIQUE: Multidetector CT imaging of the chest was performed using the standard protocol during bolus administration of intravenous contrast. Multiplanar CT image reconstructions and MIPs were obtained to evaluate the vascular anatomy. CONTRAST:  145mL OMNIPAQUE IOHEXOL 350 MG/ML SOLN COMPARISON:  Chest x-ray 06/01/2020 CT chest 04/27/2019. FINDINGS: Cardiovascular: Satisfactory opacification of the pulmonary arteries to the segmental level. No evidence of pulmonary embolism. Enlarged left heart. No significant pericardial effusion. The thoracic aorta is normal in caliber. Mild atherosclerotic plaque of the thoracic aorta. At least 3 vessel moderate to severe coronary  artery calcifications. Mediastinum/Nodes: Borderline enlarged 1 cm right paratracheal lymph node. Similarly prominent right hilar lymph node. No enlarged mediastinal, hilar, or axillary lymph nodes. Trace debris within the trachea. Otherwise the thyroid gland, trachea, and esophagus demonstrate no significant findings. Lungs/Pleura: Bilateral  trace pleural effusions, left greater than right. Bilateral lower lobe subsegmental and linear atelectasis. Linear atelectasis within the lingula. Diffuse bronchial wall thickening. Centrilobular nodularity within bilateral upper lobes. Upper Abdomen: A splenule is noted.  No acute abnormality. Musculoskeletal: Marked bilateral gynecomastia. Mild subcutaneus soft tissue edema of the right chest wall and slightly asymmetric/hazy right pectoralis muscle. Acute comminuted right humeral head and neck fracture. Interval worsening of a T9 (greater than 75% height loss) and T12 (greater than 50% height loss) compression fracture. Similar-appearing anterior wedge shaped T8 (greater than 75% height loss) compression fracture. Several other vertebral bodies demonstrate vertebral height loss. Old healed bilateral rib fractures. No definite acute displaced rib fracture. Review of the MIP images confirms the above findings. IMPRESSION: 1. No pulmonary embolus. 2. Findings suggestive of bronchiolitis. Followup PA and lateral chest X-ray is recommended in 3-4 weeks following therapy to ensure resolution and exclude underlying malignancy. 3. Interval development of bilateral, left greater than right trace pleural effusions in the setting left cardiomegaly and three-vessel coronary artery calcifications. 4. Acute comminuted right humeral head and neck fracture. Associated right shoulder cutaneous soft tissue edema as well as right chest wall edema. Recommend dedicated right shoulder radiographs for further evaluation. 5. Interval worsening of T9 and T12 compression fractures. Correlate with point tenderness to palpation to evaluate for an acute component. Stable T8 compression fracture. 6. Aortic Atherosclerosis (ICD10-I70.0). Electronically Signed   By: Iven Finn M.D.   On: 06/01/2020 04:36   DG Chest Portable 1 View  Result Date: 06/01/2020 CLINICAL DATA:  Shortness of breath EXAM: PORTABLE CHEST 1 VIEW COMPARISON:   12/26/2019 FINDINGS: Cardiac shadow is enlarged in size. Postsurgical changes are noted. Right chest wall port is seen. Mild vascular congestion is noted with interstitial edema. No focal infiltrate or sizable effusion is seen. No bony abnormality is noted. IMPRESSION: Mild changes of CHF. Electronically Signed   By: Inez Catalina M.D.   On: 06/01/2020 01:22   Assessment and Plan:   1. Presumed acute systolic CHF: -Pt presented with acute onset of SOB which began yesterday evening while attempting to go to sleep. He states that he has been having mild DOE for the last several months with ambulation and walking up stairs however contributed his symptoms to chemotherapy. He also reports intermittent LE edema which he also contributed as a side effect from the chemotherapy medications. -Last LHC 2014 with atretic free RIMA to RCA, patent SVG to diagonal, patent SVG to ramus/OM, patent LIMA to LAD with normal LVEF.  -BNP found to be markedly elevated at 3690 with evidence of CHF on CXR  -Treated with IV Lasix 40mg  in the ED>>reports breathing improved>>off Bipap -No overt fluid volume overload on exam   -Continue IV Lasix 40mg  BID and follow response closely -Repeat echocardiogram this admission  -On PTA lisinopril however would hold in the setting of AKI. If LV dysfunction, would change to losartan.    2. Elevated HsT with known CAD s/p CABG: -Last LHC in 2014 withatretic free RIMA to RCA, patent SVG to diagonal, patent SVG to ramus/OM, patent LIMA to LAD with normal LVEF.  -Report surveillance stress test of unknown time that was normal through the [place] initially at 87>>238>>573 with new  TWI and questionable STE on EKG today.  -Denies anginal symptoms -Obtain echocardiogram this admission>>suspect some new LV dysfunction given symptoms  -Given recent dx of pancreatic adenocarcinoma s/p whipple procedure and chemotherapy 05/2019 with recurrent lesion on recent scan>>plan for follow up with  radiation oncologist next week  -Likely HsT in the setting of ACS however given unknown plan for pancreatic cancer with possible need for surgery and the absence of angina, would recommend treating HF symptoms at this time and optimizing medical therapy  -Would start IV Heparin for 48H  -On PTA ASA -No statin in the setting of elevated LFTs -Unclear if patient on beta blocker   3. AKI: -Cr at 1.32 with previous Cr at 0.84 -Likely in the setting of severe fluid volume overload  -Follow with diuresis   4. Pancreatic adenocarcinoma: -S/p Whipple procedure at [facility] 05/2019 with follow up chemotherpay completed 01/2020 -Recurrence on recent PET scan -Plan for radiation oncology consultation next week  -Follows with onco/hemo at the [facility]  5. HTN: -Stable, 129/74>>116/81 -On PTA lisinopril 20mg , Imdur 30mg    6. Humerus fracture: -Recent mechanical fall secondary to snow/ice with humerus fracture followed by orthopedics with plan for conservative management    For questions or updates, please contact [facility] Please consult [email or web address] for contact info under Cardiology/STEMI.   SignedKathyrn Drown NP-C [facility] Pager: [phone] 06/01/2020 7:32 AM As above, patient seen and examined.  Briefly he is a 75 year old male with past medical history of coronary artery disease status post coronary artery bypass and graft, adenocarcinoma of the pancreas status post Whipple procedure with recent recurrence, hypertension, hyperlipidemia, status post recent fall with fracture of right humerus and thoracic compression fracture for evaluation of acute dyspnea.  Patient states that for the past 2 to 3 months he has had worsening dyspnea on exertion.  He has gained 8 pounds.  He also stated he had some bilateral pulmonary edema.  He awoke last evening acutely short of breath that improved with sitting up.  There has been no chest pain.  He has had cough productive of white sputum.  No hemoptysis.   He presented to the emergency room and cardiology asked to evaluate.  Note he received Lasix in the emergency room with improvement. Chest x-ray with mild CHF.  Chest CT shows no pulmonary embolus.  Pattern suggestive of possible bronchiolitis and follow-up chest x-ray recommended 3 to 4 weeks to rule out malignancy.  Also bilateral pleural effusions.  Creatinine 1.32, BNP 3690.  Troponin 87, 238 and 573.  Hemoglobin 11.8.  Electrocardiogram shows sinus tachycardia with PACs.  Follow-up ECG showed lateral T wave inversion.  1 acute systolic CHF-patient presents with progressive dyspnea on exertion for 3 months followed by acute dyspnea last evening.  BNP elevated.  Some improvement with diuresis.  Troponin minimally elevated.  We will arrange echocardiogram to assess LV function.  If decreased would need to consider possible cardiomyopathy related to chemotherapy versus ischemia mediated.  We will continue Lasix 40 mg IV twice daily.  Follow renal function.  We will add medications based on echocardiogram.  2 mildly elevated troponin-patient denies chest pain.  Nonspecific T wave changes noted on follow-up ECG but no ST elevation.  Continue aspirin and statin.  We will continue to cycle troponins.  Await results of echocardiogram.  May need further ischemia evaluation pending results.  3 acute kidney injury-creatinine mildly increased and patient also underwent CTA to rule out pulmonary embolus.  Follow renal function closely with diuresis.  At risk for contrast nephropathy.  4 recurrent adenocarcinoma of the pancreas-patient has had previous Whipple but states he was recently told he had recurrence.  He is scheduled to see radiation oncology for radiation schedule.  Note CTA did not show pulmonary embolus but there was a question of bronchiolitis and follow-up chest x-ray recommended in 3 to 4 weeks to rule out metastatic cancer.  Kirk Ruths, MD

## 2020-06-01 NOTE — Progress Notes (Signed)
Patient arrived to unit with right arm sling, patient A&O x4, only complaint is right arm pain 5/10. Will assess and continue to monitor.

## 2020-06-01 NOTE — ED Notes (Signed)
Attempted report 

## 2020-06-02 ENCOUNTER — Encounter (HOSPITAL_COMMUNITY): Payer: Self-pay | Admitting: Family Medicine

## 2020-06-02 ENCOUNTER — Other Ambulatory Visit: Payer: Self-pay

## 2020-06-02 ENCOUNTER — Inpatient Hospital Stay (HOSPITAL_COMMUNITY): Payer: No Typology Code available for payment source

## 2020-06-02 ENCOUNTER — Inpatient Hospital Stay (HOSPITAL_COMMUNITY)
Admission: EM | Disposition: A | Discharge status: Home / Self Care | Payer: Self-pay | Source: Home / Self Care | Attending: Family Medicine

## 2020-06-02 DIAGNOSIS — I5021 Acute systolic (congestive) heart failure: Secondary | ICD-10-CM

## 2020-06-02 DIAGNOSIS — I214 Non-ST elevation (NSTEMI) myocardial infarction: Secondary | ICD-10-CM | POA: Diagnosis not present

## 2020-06-02 DIAGNOSIS — I2510 Atherosclerotic heart disease of native coronary artery without angina pectoris: Secondary | ICD-10-CM | POA: Diagnosis not present

## 2020-06-02 DIAGNOSIS — I429 Cardiomyopathy, unspecified: Secondary | ICD-10-CM

## 2020-06-02 HISTORY — PX: LEFT HEART CATH AND CORS/GRAFTS ANGIOGRAPHY: CATH118250

## 2020-06-02 LAB — COMPREHENSIVE METABOLIC PANEL
ALT: 37 U/L (ref 0–44)
AST: 47 U/L — ABNORMAL HIGH (ref 15–41)
Albumin: 2.8 g/dL — ABNORMAL LOW (ref 3.5–5.0)
Alkaline Phosphatase: 163 U/L — ABNORMAL HIGH (ref 38–126)
Anion gap: 12 (ref 5–15)
BUN: 12 mg/dL (ref 8–23)
CO2: 29 mmol/L (ref 22–32)
Calcium: 8.7 mg/dL — ABNORMAL LOW (ref 8.9–10.3)
Chloride: 100 mmol/L (ref 98–111)
Creatinine, Ser: 1.16 mg/dL (ref 0.61–1.24)
GFR, Estimated: >60 mL/min (ref >60)
Glucose, Bld: 112 mg/dL — ABNORMAL HIGH (ref 70–99)
Potassium: 3.3 mmol/L — ABNORMAL LOW (ref 3.5–5.1)
Sodium: 141 mmol/L (ref 135–145)
Total Bilirubin: 1 mg/dL (ref 0.3–1.2)
Total Protein: 5.6 g/dL — ABNORMAL LOW (ref 6.5–8.1)

## 2020-06-02 LAB — CBC
HCT: 34.5 % — ABNORMAL LOW (ref 39.0–52.0)
Hemoglobin: 11.8 g/dL — ABNORMAL LOW (ref 13.0–17.0)
MCH: 31.9 pg (ref 26.0–34.0)
MCHC: 34.2 g/dL (ref 30.0–36.0)
MCV: 93.2 fL (ref 80.0–100.0)
Platelets: 267 10*3/uL (ref 150–400)
RBC: 3.7 MIL/uL — ABNORMAL LOW (ref 4.22–5.81)
RDW: 14.6 % (ref 11.5–15.5)
WBC: 7.7 10*3/uL (ref 4.0–10.5)
nRBC: 0 % (ref 0.0–0.2)

## 2020-06-02 LAB — TROPONIN I (HIGH SENSITIVITY): Troponin I (High Sensitivity): 1424 ng/L (ref <18)

## 2020-06-02 LAB — MAGNESIUM: Magnesium: 1.8 mg/dL (ref 1.7–2.4)

## 2020-06-02 SURGERY — LEFT HEART CATH AND CORS/GRAFTS ANGIOGRAPHY
Anesthesia: LOCAL

## 2020-06-02 MED ORDER — LEVOTHYROXINE SODIUM 75 MCG PO TABS
75.0000 ug | ORAL_TABLET | Freq: Every day | ORAL | Status: DC
  Administered 2020-06-03: 75 ug via ORAL
  Filled 2020-06-02: qty 1

## 2020-06-02 MED ORDER — FENTANYL CITRATE (PF) 100 MCG/2ML IJ SOLN
INTRAMUSCULAR | Status: AC
  Filled 2020-06-02: qty 2

## 2020-06-02 MED ORDER — ASPIRIN 81 MG PO CHEW
81.0000 mg | CHEWABLE_TABLET | ORAL | Status: AC
  Administered 2020-06-02: 81 mg via ORAL
  Filled 2020-06-02: qty 1

## 2020-06-02 MED ORDER — ENOXAPARIN SODIUM 40 MG/0.4ML ~~LOC~~ SOLN
40.0000 mg | SUBCUTANEOUS | Status: DC
  Administered 2020-06-03: 40 mg via SUBCUTANEOUS
  Filled 2020-06-02: qty 0.4

## 2020-06-02 MED ORDER — SODIUM CHLORIDE 0.9% FLUSH: 3.0000 mL | Freq: Two times a day (BID) | INTRAVENOUS | Status: DC

## 2020-06-02 MED ORDER — HEPARIN (PORCINE) IN NACL 1000-0.9 UT/500ML-% IV SOLN
INTRAVENOUS | Status: DC | PRN
  Administered 2020-06-02 (×2): 500 mL

## 2020-06-02 MED ORDER — CLOPIDOGREL BISULFATE 75 MG PO TABS
300.0000 mg | ORAL_TABLET | Freq: Once | ORAL | Status: AC
  Administered 2020-06-02: 300 mg via ORAL
  Filled 2020-06-02: qty 4

## 2020-06-02 MED ORDER — LIDOCAINE HCL (PF) 1 % IJ SOLN
INTRAMUSCULAR | Status: DC | PRN
  Administered 2020-06-02: 2 mL

## 2020-06-02 MED ORDER — ASPIRIN 81 MG PO CHEW: 81.0000 mg | CHEWABLE_TABLET | ORAL | Status: DC

## 2020-06-02 MED ORDER — LOSARTAN POTASSIUM 25 MG PO TABS
25.0000 mg | ORAL_TABLET | Freq: Every day | ORAL | Status: DC
  Administered 2020-06-02 – 2020-06-03 (×2): 25 mg via ORAL
  Filled 2020-06-02 (×2): qty 1

## 2020-06-02 MED ORDER — FUROSEMIDE 20 MG PO TABS
20.0000 mg | ORAL_TABLET | Freq: Every day | ORAL | Status: DC
  Administered 2020-06-03: 20 mg via ORAL
  Filled 2020-06-02: qty 1

## 2020-06-02 MED ORDER — SODIUM CHLORIDE 0.9 % IV SOLN: 250.0000 mL | INTRAVENOUS | Status: DC | PRN

## 2020-06-02 MED ORDER — VERAPAMIL HCL 2.5 MG/ML IV SOLN
INTRAVENOUS | Status: AC
  Filled 2020-06-02: qty 2

## 2020-06-02 MED ORDER — SODIUM CHLORIDE 0.9% FLUSH
3.0000 mL | Freq: Two times a day (BID) | INTRAVENOUS | Status: DC
  Administered 2020-06-02 – 2020-06-03 (×3): 3 mL via INTRAVENOUS

## 2020-06-02 MED ORDER — DULOXETINE HCL 30 MG PO CPEP
30.0000 mg | ORAL_CAPSULE | Freq: Every day | ORAL | Status: DC
  Administered 2020-06-03: 30 mg via ORAL
  Filled 2020-06-02: qty 1

## 2020-06-02 MED ORDER — MAGNESIUM SULFATE 2 GM/50ML IV SOLN
2.0000 g | Freq: Once | INTRAVENOUS | Status: AC
  Administered 2020-06-02: 2 g via INTRAVENOUS
  Filled 2020-06-02: qty 50

## 2020-06-02 MED ORDER — FUROSEMIDE 20 MG PO TABS
20.0000 mg | ORAL_TABLET | Freq: Every day | ORAL | Status: DC
  Filled 2020-06-02: qty 1

## 2020-06-02 MED ORDER — MIDAZOLAM HCL 2 MG/2ML IJ SOLN
INTRAMUSCULAR | Status: DC | PRN
  Administered 2020-06-02: 1 mg via INTRAVENOUS

## 2020-06-02 MED ORDER — SPIRONOLACTONE 12.5 MG HALF TABLET
12.5000 mg | ORAL_TABLET | Freq: Every day | ORAL | Status: DC
  Administered 2020-06-02 – 2020-06-03 (×2): 12.5 mg via ORAL
  Filled 2020-06-02 (×2): qty 1

## 2020-06-02 MED ORDER — POTASSIUM CHLORIDE CRYS ER 20 MEQ PO TBCR
20.0000 meq | EXTENDED_RELEASE_TABLET | Freq: Two times a day (BID) | ORAL | Status: DC
  Administered 2020-06-02 – 2020-06-03 (×3): 20 meq via ORAL
  Filled 2020-06-02 (×3): qty 1

## 2020-06-02 MED ORDER — SODIUM CHLORIDE 0.9 % IV SOLN: INTRAVENOUS | Status: DC

## 2020-06-02 MED ORDER — HEPARIN (PORCINE) IN NACL 1000-0.9 UT/500ML-% IV SOLN
INTRAVENOUS | Status: AC
  Filled 2020-06-02: qty 1500

## 2020-06-02 MED ORDER — SODIUM CHLORIDE 0.9% FLUSH: 3.0000 mL | INTRAVENOUS | Status: DC | PRN

## 2020-06-02 MED ORDER — FENTANYL CITRATE (PF) 100 MCG/2ML IJ SOLN
INTRAMUSCULAR | Status: DC | PRN
  Administered 2020-06-02: 25 ug via INTRAVENOUS

## 2020-06-02 MED ORDER — VERAPAMIL HCL 2.5 MG/ML IV SOLN
INTRAVENOUS | Status: DC | PRN
  Administered 2020-06-02: 10 mL via INTRA_ARTERIAL

## 2020-06-02 MED ORDER — HYDRALAZINE HCL 20 MG/ML IJ SOLN: 10.0000 mg | INTRAMUSCULAR | Status: AC | PRN

## 2020-06-02 MED ORDER — CARVEDILOL 3.125 MG PO TABS
3.1250 mg | ORAL_TABLET | Freq: Two times a day (BID) | ORAL | Status: DC
  Administered 2020-06-02 – 2020-06-03 (×2): 3.125 mg via ORAL
  Filled 2020-06-02 (×2): qty 1

## 2020-06-02 MED ORDER — CLOPIDOGREL BISULFATE 75 MG PO TABS
75.0000 mg | ORAL_TABLET | Freq: Every day | ORAL | Status: DC
  Administered 2020-06-03: 75 mg via ORAL
  Filled 2020-06-02: qty 1

## 2020-06-02 MED ORDER — LIDOCAINE HCL (PF) 1 % IJ SOLN
INTRAMUSCULAR | Status: AC
  Filled 2020-06-02: qty 30

## 2020-06-02 MED ORDER — HEPARIN SODIUM (PORCINE) 1000 UNIT/ML IJ SOLN
INTRAMUSCULAR | Status: DC | PRN
  Administered 2020-06-02: 4000 [IU] via INTRAVENOUS

## 2020-06-02 MED ORDER — GABAPENTIN 600 MG PO TABS
600.0000 mg | ORAL_TABLET | Freq: Three times a day (TID) | ORAL | Status: DC
  Administered 2020-06-03: 600 mg via ORAL
  Filled 2020-06-02: qty 1

## 2020-06-02 MED ORDER — IOHEXOL 350 MG/ML SOLN
INTRAVENOUS | Status: DC | PRN
  Administered 2020-06-02: 65 mL

## 2020-06-02 MED ORDER — MIDAZOLAM HCL 2 MG/2ML IJ SOLN
INTRAMUSCULAR | Status: AC
  Filled 2020-06-02: qty 2

## 2020-06-02 MED ORDER — ENOXAPARIN SODIUM 40 MG/0.4ML ~~LOC~~ SOLN
40.0000 mg | SUBCUTANEOUS | Status: DC
  Administered 2020-06-02: 40 mg via SUBCUTANEOUS
  Filled 2020-06-02: qty 0.4

## 2020-06-02 MED ORDER — HEPARIN SODIUM (PORCINE) 1000 UNIT/ML IJ SOLN
INTRAMUSCULAR | Status: AC
  Filled 2020-06-02: qty 1

## 2020-06-02 SURGICAL SUPPLY — 13 items
BAG SNAP BAND KOVER 36X36 (MISCELLANEOUS) ×2 IMPLANT
CATH INFINITI 5 FR IM (CATHETERS) ×2 IMPLANT
CATH INFINITI 5 FR MPA2 (CATHETERS) ×2 IMPLANT
DEVICE RAD COMP TR BAND LRG (VASCULAR PRODUCTS) ×2 IMPLANT
GLIDESHEATH SLEND SS 6F .021 (SHEATH) ×2 IMPLANT
GUIDEWIRE INQWIRE 1.5J.035X260 (WIRE) ×1 IMPLANT
INQWIRE 1.5J .035X260CM (WIRE) ×2
KIT HEART LEFT (KITS) ×2 IMPLANT
PACK CARDIAC CATHETERIZATION (CUSTOM PROCEDURE TRAY) ×2 IMPLANT
SHEATH GLIDE SLENDER 4/5FR (SHEATH) IMPLANT
SYR MEDRAD MARK 7 150ML (SYRINGE) ×2 IMPLANT
TRANSDUCER W/STOPCOCK (MISCELLANEOUS) ×2 IMPLANT
TUBING CIL FLEX 10 FLL-RA (TUBING) ×2 IMPLANT

## 2020-06-02 NOTE — Progress Notes (Signed)
Notified by nursing that patient had an 8-beat run of VT, pt asymptomatic. Mg 1.8. Heart cath reviewed. Will supplement with 2g IV Mg. Continue to monitor on telemetry.

## 2020-06-02 NOTE — Plan of Care (Signed)

## 2020-06-02 NOTE — Progress Notes (Addendum)
Per [facility] pt had 8bt of vtach. Pt asymptomatic. PA paged to make aware.   Will continue to monitor.

## 2020-06-02 NOTE — Progress Notes (Signed)
PROGRESS NOTE    Isaiah Patterson  ZOX:[id number] DOB: 1945/10/31 DOA: 06/01/2020 PCP: [facility], [facility]      Brief Narrative:  Isaiah Patterson is a 75 y.o. M with CAD s/p CABG, pancreatic adenocarcinoma, s/p Whipple in 2021, recent recurrence, and recent humeral fracture who persented with SOB.  Has had progressive fatigue and DOE and leg swelling for several weeks.  Then in the last 24 hours before admission, had SOB with exertion, diaphoresis, pallor and feeling as if he would die from SOB.    In the ER, he had acute respiratory distress, pallor, diaphoresis, and tachycardia to 140s.  Chest x-ray showed bilateral infiltrates, BNP 3690, high-sensitivity troponin 87 and trending up.  Covid negative.  Started on BiPAP and given IV Lasix.        Assessment & Plan:  Acute on chronic systolic CHF Presented with dyspnea on exertion, leg swelling, cardiomegaly, pleural effusions, and bilateral infiltrates on chest imaging.  Diuresis 2 L now, wean off oxygen, and breathing feels better without orthopnea. -Transition to oral Lasix -Resume losartan, spironolactone -Hold beta-blocker -Potassium supplement, strict I's and O's, daily BMP  -Consult cardiology, appreciate expert cares, plan ischemic eval with angiography later today  -I have requested outside records from the [place] for his chemo regimen    Demand ischemia vs ACS CAD, secondary prevention -Continue aspirin, atorvastatin, Imdur    Pancreatic adenocarcinoma Patient had a recent recurrence he reports. -I will request outside records from his oncologist at the [facility]   Mild AKI  Cr 1.3, up from baseline 0.8.  Congestive nephropathy, resolved with diuresis -Resume ARB -Monitor creatinine tomorrow after resuming ARB, spironolactone, and IV dye  GERD -Continue PPI    Other medications -Resume home duloxetine and gabapentin tomorrow    Hypothyroidism -Continue levothyroxine       Disposition: Status is:  Inpatient  Remains inpatient appropriate because: Ongoing work-up, not appropriate for the outpatient setting   Dispo: The patient is from: Home              Anticipated d/c is to: Home              Anticipated d/c date is: 1 day              Patient currently is not medically stable to d/c.   Difficult to place patient No     Patient was admitted for acute congestive heart failure.  He has now been diuresed to stable.  He needs observation for 24 hours on oral diuretics, and repeat creatinine tomorrow after resuming spironolactone, losartan, and getting IV contrast today.  If creatinine okay tomorrow, likely home.         MDM: The below labs and imaging reports reviewed and summarized above.  Medication management as above.     DVT prophylaxis: enoxaparin (LOVENOX) injection 40 mg Start: 06/02/20 0945  Code Status: FULL Family Communication: None present   Consultants Cardiology  Procedures 2/2 echocardiogram-EF less than 20% 2/3 left heart cath-pending        Subjective: Patient is feeling better.  His leg swelling is resolved, he has no orthopnea.  He is not dizzy and he has no chest pain.  No confusion or fever cough        Objective: Vitals:   06/02/20 0051 06/02/20 0439 06/02/20 0900 06/02/20 1310  BP: 122/79 128/79 134/83   Pulse: 98 (!) 49 (!) 43 92  Resp: 18 20 (!) 21 18  Temp: 98.2 F (  36.8 C) 98.6 F (37 C) 98.4 F (36.9 C)   TempSrc: Oral Oral Oral   SpO2: 92% 96% 94%   Weight: 80.2 kg     Height:        Intake/Output Summary (Last 24 hours) at 06/02/2020 1455 Last data filed at 06/02/2020 [id number] Gross per 24 hour  Intake 480 ml  Output 2275 ml  Net -1795 ml   Filed Weights   06/01/20 1111 06/02/20 0051  Weight: 81.6 kg 80.2 kg    Examination: General appearance: Elderly adult male, lying in bed with his feet crossed, watching television, no acute distress.     HEENT: Anicteric, conjunctival pink, lids and lashes normal.  No  nasal deformity, discharge, or epistaxis.  Dentures in place, lips normal, oropharynx moist, no oral lesions, hearing normal Skin: No suspicious rashes or lesions Cardiac: Regular rate and rhythm, do not appreciate murmurs, JVP slightly elevated, no lower extremity edema at all.   Respiratory: Regular respiratory rate, no rales.  No wheezing. Abdomen: Abdomen soft no tenderness palpation or guarding, no ascites or distention MSK: Normal muscle bulk and tone Neuro: Awake and alert, extraocular movements intact, moves all extremities with generalized weakness but symmetric strength, speech fluent. Psych: Attention normal, affect normal, judgment site appear normal.      Data Reviewed: I have personally reviewed following labs and imaging studies:  CBC: Recent Labs  Lab 06/01/20 0114 06/01/20 0307 06/02/20 0303  WBC 10.4  --  7.7  NEUTROABS 3.6  --   --   HGB 11.8* 11.2* 11.8*  HCT 40.0 33.0* 34.5*  MCV 104.2*  --  93.2  PLT 323  --  99991111   Basic Metabolic Panel: Recent Labs  Lab 06/01/20 0114 06/01/20 0307 06/02/20 0303  NA 144 142 141  K 4.3 3.5 3.3*  CL 107  --  100  CO2 17*  --  29  GLUCOSE 162*  --  112*  BUN 9  --  12  CREATININE 1.32*  --  1.16  CALCIUM 8.9  --  8.7*  MG  --   --  1.8   GFR: Estimated Creatinine Clearance: 55.8 mL/min (by C-G formula based on SCr of 1.16 mg/dL). Liver Function Tests: Recent Labs  Lab 06/01/20 0114 06/02/20 0303  AST 47* 47*  ALT 31 37  ALKPHOS 151* 163*  BILITOT 1.0 1.0  PROT 5.9* 5.6*  ALBUMIN 3.0* 2.8*   No results for input(s): LIPASE, AMYLASE in the last 168 hours. No results for input(s): AMMONIA in the last 168 hours. Coagulation Profile: No results for input(s): INR, PROTIME in the last 168 hours. Cardiac Enzymes: No results for input(s): CKTOTAL, CKMB, CKMBINDEX, TROPONINI in the last 168 hours. BNP (last 3 results) No results for input(s): PROBNP in the last 8760 hours. HbA1C: No results for input(s):  HGBA1C in the last 72 hours. CBG: No results for input(s): GLUCAP in the last 168 hours. Lipid Profile: No results for input(s): CHOL, HDL, LDLCALC, TRIG, CHOLHDL, LDLDIRECT in the last 72 hours. Thyroid Function Tests: No results for input(s): TSH, T4TOTAL, FREET4, T3FREE, THYROIDAB in the last 72 hours. Anemia Panel: No results for input(s): VITAMINB12, FOLATE, FERRITIN, TIBC, IRON, RETICCTPCT in the last 72 hours. Urine analysis:    Component Value Date/Time   COLORURINE AMBER (A) 03/31/2019 1333   APPEARANCEUR CLEAR 03/31/2019 1333   LABSPEC 1.012 03/31/2019 1333   PHURINE 5.0 03/31/2019 1333   GLUCOSEU NEGATIVE 03/31/2019 1333   HGBUR SMALL (A) 03/31/2019 1333  BILIRUBINUR MODERATE (A) 03/31/2019 1333   KETONESUR NEGATIVE 03/31/2019 1333   PROTEINUR NEGATIVE 03/31/2019 1333   NITRITE NEGATIVE 03/31/2019 1333   LEUKOCYTESUR NEGATIVE 03/31/2019 1333   Sepsis Labs: @LABRCNTIP (procalcitonin:4,lacticacidven:4)  ) Recent Results (from the past 240 hour(s))  SARS Coronavirus 2 by RT PCR (hospital order, performed in [facility] hospital lab) Nasopharyngeal Nasopharyngeal Swab     Status: None   Collection Time: 06/01/20  1:14 AM   Specimen: Nasopharyngeal Swab  Result Value Ref Range Status   SARS Coronavirus 2 NEGATIVE NEGATIVE Final    Comment: (NOTE) SARS-CoV-2 target nucleic acids are NOT DETECTED.  The SARS-CoV-2 RNA is generally detectable in upper and lower respiratory specimens during the acute phase of infection. The lowest concentration of SARS-CoV-2 viral copies this assay can detect is 250 copies / mL. A negative result does not preclude SARS-CoV-2 infection and should not be used as the sole basis for treatment or other patient management decisions.  A negative result may occur with improper specimen collection / handling, submission of specimen other than nasopharyngeal swab, presence of viral mutation(s) within the areas targeted by this assay, and  inadequate number of viral copies (<250 copies / mL). A negative result must be combined with clinical observations, patient history, and epidemiological information.  Fact Sheet for Patients:   StrictlyIdeas.no  Fact Sheet for Healthcare Providers: BankingDealers.co.za  This test is not yet approved or  cleared by the [place] FDA and has been authorized for detection and/or diagnosis of SARS-CoV-2 by FDA under an Emergency Use Authorization (EUA).  This EUA will remain in effect (meaning this test can be used) for the duration of the COVID-19 declaration under Section 564(b)(1) of the Act, 21 U.S.C. section 360bbb-3(b)(1), unless the authorization is terminated or revoked sooner.  Performed at [facility], [place] [address], [place], [place] [address]          Radiology Studies: CT Angio Chest PE W/Cm &/Or Wo Cm  Result Date: 06/01/2020 CLINICAL DATA:  Chest pain, shortness of breath. EXAM: CT ANGIOGRAPHY CHEST WITH CONTRAST TECHNIQUE: Multidetector CT imaging of the chest was performed using the standard protocol during bolus administration of intravenous contrast. Multiplanar CT image reconstructions and MIPs were obtained to evaluate the vascular anatomy. CONTRAST:  11mL OMNIPAQUE IOHEXOL 350 MG/ML SOLN COMPARISON:  Chest x-ray 06/01/2020 CT chest 04/27/2019. FINDINGS: Cardiovascular: Satisfactory opacification of the pulmonary arteries to the segmental level. No evidence of pulmonary embolism. Enlarged left heart. No significant pericardial effusion. The thoracic aorta is normal in caliber. Mild atherosclerotic plaque of the thoracic aorta. At least 3 vessel moderate to severe coronary artery calcifications. Mediastinum/Nodes: Borderline enlarged 1 cm right paratracheal lymph node. Similarly prominent right hilar lymph node. No enlarged mediastinal, hilar, or axillary lymph nodes. Trace debris within the trachea. Otherwise the  thyroid gland, trachea, and esophagus demonstrate no significant findings. Lungs/Pleura: Bilateral trace pleural effusions, left greater than right. Bilateral lower lobe subsegmental and linear atelectasis. Linear atelectasis within the lingula. Diffuse bronchial wall thickening. Centrilobular nodularity within bilateral upper lobes. Upper Abdomen: A splenule is noted.  No acute abnormality. Musculoskeletal: Marked bilateral gynecomastia. Mild subcutaneus soft tissue edema of the right chest wall and slightly asymmetric/hazy right pectoralis muscle. Acute comminuted right humeral head and neck fracture. Interval worsening of a T9 (greater than 75% height loss) and T12 (greater than 50% height loss) compression fracture. Similar-appearing anterior wedge shaped T8 (greater than 75% height loss) compression fracture. Several other vertebral bodies demonstrate vertebral height  loss. Old healed bilateral rib fractures. No definite acute displaced rib fracture. Review of the MIP images confirms the above findings. IMPRESSION: 1. No pulmonary embolus. 2. Findings suggestive of bronchiolitis. Followup PA and lateral chest X-ray is recommended in 3-4 weeks following therapy to ensure resolution and exclude underlying malignancy. 3. Interval development of bilateral, left greater than right trace pleural effusions in the setting left cardiomegaly and three-vessel coronary artery calcifications. 4. Acute comminuted right humeral head and neck fracture. Associated right shoulder cutaneous soft tissue edema as well as right chest wall edema. Recommend dedicated right shoulder radiographs for further evaluation. 5. Interval worsening of T9 and T12 compression fractures. Correlate with point tenderness to palpation to evaluate for an acute component. Stable T8 compression fracture. 6. Aortic Atherosclerosis (ICD10-I70.0). Electronically Signed   By: Iven Finn M.D.   On: 06/01/2020 04:36   DG Chest Portable 1 View  Result  Date: 06/01/2020 CLINICAL DATA:  Shortness of breath EXAM: PORTABLE CHEST 1 VIEW COMPARISON:  12/26/2019 FINDINGS: Cardiac shadow is enlarged in size. Postsurgical changes are noted. Right chest wall port is seen. Mild vascular congestion is noted with interstitial edema. No focal infiltrate or sizable effusion is seen. No bony abnormality is noted. IMPRESSION: Mild changes of CHF. Electronically Signed   By: Inez Catalina M.D.   On: 06/01/2020 01:22   ECHOCARDIOGRAM COMPLETE  Result Date: 06/01/2020    ECHOCARDIOGRAM REPORT   Patient Name:   Isaiah Patterson Date of Exam: 06/01/2020 Medical Rec #:  [id number]      Height:       67.0 in Accession #:    [id number]     Weight:       180.0 lb Date of Birth:  [birth year]      BSA:          1.934 m Patient Age:    20 years       BP:           102/77 mmHg Patient Gender: M              HR:           76 bpm. Exam Location:  Inpatient Procedure: 2D Echo, Cardiac Doppler, Color Doppler and Intracardiac            Opacification Agent Indications:    R06.02 SOB  History:        Patient has no prior history of Echocardiogram examinations.                 CHF, CAD, Signs/Symptoms:Dyspnea, Shortness of Breath and Chest                 Pain; Risk Factors:Hypertension and Dyslipidemia. Cancer.  Sonographer:    Roseanna Rainbow RDCS Referring Phys: [id number] TIMOTHY S OPYD  Sonographer Comments: Technically difficult study due to poor echo windows. IMPRESSIONS  1. Left ventricular ejection fraction, by estimation, is <20%. The left ventricle has severely decreased function. The left ventricle demonstrates global hypokinesis. There is mild left ventricular hypertrophy. Left ventricular diastolic parameters are indeterminate.  2. Right ventricular systolic function is moderately reduced. The right ventricular size is mildly enlarged. Tricuspid regurgitation signal is inadequate for assessing PA pressure.  3. Left atrial size was moderately dilated.  4. The mitral valve is normal in structure.  Trivial mitral valve regurgitation.  5. The aortic valve is tricuspid. Aortic valve regurgitation is not visualized. Mild to moderate aortic valve sclerosis/calcification is present, without any evidence of aortic  stenosis.  6. Aortic dilatation noted. There is mild dilatation of the ascending aorta, measuring 39 mm.  7. The inferior vena cava is dilated in size with <50% respiratory variability, suggesting right atrial pressure of 15 mmHg.  8. There is a filling defect adjacent to anteroapical wall on contrast images (best seen on images 104,105). Suspect this represents prominent LV trabeculation rather than thrombus. Would consider cardiac MRI both to rule out thrombus and evaluate for etiology of cardiomyopathy FINDINGS  Left Ventricle: Left ventricular ejection fraction, by estimation, is <20%. The left ventricle has severely decreased function. The left ventricle demonstrates global hypokinesis. Definity contrast agent was given IV to delineate the left ventricular endocardial borders. The left ventricular internal cavity size was normal in size. There is mild left ventricular hypertrophy. Left ventricular diastolic parameters are indeterminate. Right Ventricle: The right ventricular size is mildly enlarged. Right vetricular wall thickness was not well visualized. Right ventricular systolic function is moderately reduced. Tricuspid regurgitation signal is inadequate for assessing PA pressure. Left Atrium: Left atrial size was moderately dilated. Right Atrium: Right atrial size was normal in size. Pericardium: There is no evidence of pericardial effusion. Mitral Valve: The mitral valve is normal in structure. Trivial mitral valve regurgitation. Tricuspid Valve: The tricuspid valve is normal in structure. Tricuspid valve regurgitation is trivial. Aortic Valve: The aortic valve is tricuspid. Aortic valve regurgitation is not visualized. Mild to moderate aortic valve sclerosis/calcification is present, without any  evidence of aortic stenosis. Pulmonic Valve: The pulmonic valve was not well visualized. Pulmonic valve regurgitation is not visualized. Aorta: The aortic root is normal in size and structure and aortic dilatation noted. There is mild dilatation of the ascending aorta, measuring 39 mm. Venous: The inferior vena cava is dilated in size with less than 50% respiratory variability, suggesting right atrial pressure of 15 mmHg. IAS/Shunts: The interatrial septum was not well visualized.  LEFT VENTRICLE PLAX 2D LVIDd:         5.60 cm LVIDs:         5.40 cm LV PW:         1.40 cm LV IVS:        1.30 cm LVOT diam:     1.90 cm LV SV:         52 LV SV Index:   27 LVOT Area:     2.84 cm  LV Volumes (MOD) LV vol d, MOD A2C: 212.0 ml LV vol d, MOD A4C: 265.0 ml LV vol s, MOD A2C: 176.0 ml LV vol s, MOD A4C: 215.0 ml LV SV MOD A2C:     36.0 ml LV SV MOD A4C:     265.0 ml LV SV MOD BP:      48.5 ml RIGHT VENTRICLE            IVC RV S prime:     3.92 cm/s  IVC diam: 2.70 cm TAPSE (M-mode): 1.2 cm LEFT ATRIUM              Index       RIGHT ATRIUM           Index LA diam:        4.80 cm  2.48 cm/m  RA Area:     16.90 cm LA Vol (A2C):   51.9 ml  26.84 ml/m RA Volume:   46.20 ml  23.89 ml/m LA Vol (A4C):   111.0 ml 57.40 ml/m LA Biplane Vol: 84.8 ml  43.85 ml/m  AORTIC VALVE LVOT  Vmax:   121.00 cm/s LVOT Vmean:  74.400 cm/s LVOT VTI:    0.182 m  AORTA Ao Root diam: 3.45 cm Ao Asc diam:  3.90 cm MITRAL VALVE MV Area (PHT): 4.85 cm    SHUNTS MV Decel Time: 157 msec    Systemic VTI:  0.18 m MV E velocity: 90.00 cm/s  Systemic Diam: 1.90 cm Oswaldo Milian MD Electronically signed by Oswaldo Milian MD Signature Date/Time: 06/01/2020/9:43:52 PM    Final         Scheduled Meds: . [MAR Hold] aspirin EC  81 mg Oral Daily  . [MAR Hold] atorvastatin  80 mg Oral Daily  . [MAR Hold] enoxaparin (LOVENOX) injection  40 mg Subcutaneous Q24H  . [MAR Hold] furosemide  20 mg Oral Daily  . [MAR Hold] losartan  25 mg Oral Daily   . [MAR Hold] pantoprazole  40 mg Oral Daily  . [MAR Hold] potassium chloride  20 mEq Oral BID  . [MAR Hold] sodium chloride flush  3 mL Intravenous Q12H  . [MAR Hold] spironolactone  12.5 mg Oral Daily   Continuous Infusions: . [MAR Hold] sodium chloride    . sodium chloride    . sodium chloride 10 mL/hr at 06/02/20 1233     LOS: 1 day    Time spent: 25 minutes    Edwin Dada, MD [facility] 06/02/2020, 2:55 PM     Please page though [place] or Epic secure chat:  For password, contact charge nurse

## 2020-06-02 NOTE — Progress Notes (Signed)
Progress Note  Patient Name: Isaiah Patterson Date of Encounter: 06/02/2020  [facility] Cardiologist: Dr Marlou Porch  Subjective   No CP; dyspnea has improved  Inpatient Medications    Scheduled Meds: . aspirin EC  81 mg Oral Daily  . atorvastatin  80 mg Oral Daily  . enoxaparin (LOVENOX) injection  40 mg Subcutaneous Q24H  . furosemide  40 mg Intravenous BID  . isosorbide mononitrate  30 mg Oral Daily  . pantoprazole  40 mg Oral Daily  . potassium chloride  20 mEq Oral BID   Continuous Infusions: . sodium chloride     PRN Meds: sodium chloride, acetaminophen, morphine injection, ondansetron (ZOFRAN) IV, ondansetron (ZOFRAN) IV   Vital Signs    Vitals:   06/01/20 2049 06/02/20 0051 06/02/20 0439 06/02/20 0900  BP: 110/90 122/79 128/79 134/83  Pulse: (!) 50 98 (!) 49 (!) 43  Resp: 18 18 20  (!) 21  Temp: 98.4 F (36.9 C) 98.2 F (36.8 C) 98.6 F (37 C) 98.4 F (36.9 C)  TempSrc: Oral Oral Oral Oral  SpO2: 96% 92% 96% 94%  Weight:  80.2 kg    Height:        Intake/Output Summary (Last 24 hours) at 06/02/2020 0952 Last data filed at 06/02/2020 0843 Gross per 24 hour  Intake 480 ml  Output 2275 ml  Net -1795 ml   Last 3 Weights 06/02/2020 06/01/2020 04/02/2019  Weight (lbs) 176 lb 14.4 oz 180 lb 200 lb 9.9 oz  Weight (kg) 80.241 kg 81.647 kg 91 kg      Telemetry    Sinus - Personally Reviewed   Physical Exam   GEN: No acute distress.   Neck: No JVD Cardiac: RRR, no murmurs, rubs, or gallops.  Respiratory: Clear to auscultation bilaterally. GI: Soft, nontender, non-distended  MS: No edema Neuro:  Nonfocal  Psych: Normal affect   Labs    High Sensitivity Troponin:   Recent Labs  Lab 06/01/20 0322 06/01/20 0535 06/01/20 0918 06/01/20 2124 06/02/20 0450  TROPONINIHS 238* 573* 863* 1,664* 1,424*      Chemistry Recent Labs  Lab 06/01/20 0114 06/01/20 0307 06/02/20 0303  NA 144 142 141  K 4.3 3.5 3.3*  CL 107  --  100  CO2 17*  --  29   GLUCOSE 162*  --  112*  BUN 9  --  12  CREATININE 1.32*  --  1.16  CALCIUM 8.9  --  8.7*  PROT 5.9*  --  5.6*  ALBUMIN 3.0*  --  2.8*  AST 47*  --  47*  ALT 31  --  37  ALKPHOS 151*  --  163*  BILITOT 1.0  --  1.0  GFRNONAA 56*  --  >60  ANIONGAP 20*  --  12     Hematology Recent Labs  Lab 06/01/20 0114 06/01/20 0307 06/02/20 0303  WBC 10.4  --  7.7  RBC 3.84*  --  3.70*  HGB 11.8* 11.2* 11.8*  HCT 40.0 33.0* 34.5*  MCV 104.2*  --  93.2  MCH 30.7  --  31.9  MCHC 29.5*  --  34.2  RDW 14.4  --  14.6  PLT 323  --  267    BNP Recent Labs  Lab 06/01/20 0114  BNP 3,690.0*     Radiology    CT Angio Chest PE W/Cm &/Or Wo Cm  Result Date: 06/01/2020 CLINICAL DATA:  Chest pain, shortness of breath. EXAM: CT ANGIOGRAPHY CHEST WITH CONTRAST  TECHNIQUE: Multidetector CT imaging of the chest was performed using the standard protocol during bolus administration of intravenous contrast. Multiplanar CT image reconstructions and MIPs were obtained to evaluate the vascular anatomy. CONTRAST:  130mL OMNIPAQUE IOHEXOL 350 MG/ML SOLN COMPARISON:  Chest x-ray 06/01/2020 CT chest 04/27/2019. FINDINGS: Cardiovascular: Satisfactory opacification of the pulmonary arteries to the segmental level. No evidence of pulmonary embolism. Enlarged left heart. No significant pericardial effusion. The thoracic aorta is normal in caliber. Mild atherosclerotic plaque of the thoracic aorta. At least 3 vessel moderate to severe coronary artery calcifications. Mediastinum/Nodes: Borderline enlarged 1 cm right paratracheal lymph node. Similarly prominent right hilar lymph node. No enlarged mediastinal, hilar, or axillary lymph nodes. Trace debris within the trachea. Otherwise the thyroid gland, trachea, and esophagus demonstrate no significant findings. Lungs/Pleura: Bilateral trace pleural effusions, left greater than right. Bilateral lower lobe subsegmental and linear atelectasis. Linear atelectasis within the  lingula. Diffuse bronchial wall thickening. Centrilobular nodularity within bilateral upper lobes. Upper Abdomen: A splenule is noted.  No acute abnormality. Musculoskeletal: Marked bilateral gynecomastia. Mild subcutaneus soft tissue edema of the right chest wall and slightly asymmetric/hazy right pectoralis muscle. Acute comminuted right humeral head and neck fracture. Interval worsening of a T9 (greater than 75% height loss) and T12 (greater than 50% height loss) compression fracture. Similar-appearing anterior wedge shaped T8 (greater than 75% height loss) compression fracture. Several other vertebral bodies demonstrate vertebral height loss. Old healed bilateral rib fractures. No definite acute displaced rib fracture. Review of the MIP images confirms the above findings. IMPRESSION: 1. No pulmonary embolus. 2. Findings suggestive of bronchiolitis. Followup PA and lateral chest X-ray is recommended in 3-4 weeks following therapy to ensure resolution and exclude underlying malignancy. 3. Interval development of bilateral, left greater than right trace pleural effusions in the setting left cardiomegaly and three-vessel coronary artery calcifications. 4. Acute comminuted right humeral head and neck fracture. Associated right shoulder cutaneous soft tissue edema as well as right chest wall edema. Recommend dedicated right shoulder radiographs for further evaluation. 5. Interval worsening of T9 and T12 compression fractures. Correlate with point tenderness to palpation to evaluate for an acute component. Stable T8 compression fracture. 6. Aortic Atherosclerosis (ICD10-I70.0). Electronically Signed   By: Iven Finn M.D.   On: 06/01/2020 04:36   DG Chest Portable 1 View  Result Date: 06/01/2020 CLINICAL DATA:  Shortness of breath EXAM: PORTABLE CHEST 1 VIEW COMPARISON:  12/26/2019 FINDINGS: Cardiac shadow is enlarged in size. Postsurgical changes are noted. Right chest wall port is seen. Mild vascular  congestion is noted with interstitial edema. No focal infiltrate or sizable effusion is seen. No bony abnormality is noted. IMPRESSION: Mild changes of CHF. Electronically Signed   By: Inez Catalina M.D.   On: 06/01/2020 01:22   ECHOCARDIOGRAM COMPLETE  Result Date: 06/01/2020    ECHOCARDIOGRAM REPORT   Patient Name:   VENKAT BROKAW Cinnamon Date of Exam: 06/01/2020 Medical Rec #:  [id number]      Height:       67.0 in Accession #:    [id number]     Weight:       180.0 lb Date of Birth:  12/19/1945      BSA:          1.934 m Patient Age:    12 years       BP:           102/77 mmHg Patient Gender: M  HR:           76 bpm. Exam Location:  Inpatient Procedure: 2D Echo, Cardiac Doppler, Color Doppler and Intracardiac            Opacification Agent Indications:    R06.02 SOB  History:        Patient has no prior history of Echocardiogram examinations.                 CHF, CAD, Signs/Symptoms:Dyspnea, Shortness of Breath and Chest                 Pain; Risk Factors:Hypertension and Dyslipidemia. Cancer.  Sonographer:    Roseanna Rainbow RDCS Referring Phys: [id number] TIMOTHY S OPYD  Sonographer Comments: Technically difficult study due to poor echo windows. IMPRESSIONS  1. Left ventricular ejection fraction, by estimation, is <20%. The left ventricle has severely decreased function. The left ventricle demonstrates global hypokinesis. There is mild left ventricular hypertrophy. Left ventricular diastolic parameters are indeterminate.  2. Right ventricular systolic function is moderately reduced. The right ventricular size is mildly enlarged. Tricuspid regurgitation signal is inadequate for assessing PA pressure.  3. Left atrial size was moderately dilated.  4. The mitral valve is normal in structure. Trivial mitral valve regurgitation.  5. The aortic valve is tricuspid. Aortic valve regurgitation is not visualized. Mild to moderate aortic valve sclerosis/calcification is present, without any evidence of aortic stenosis.  6.  Aortic dilatation noted. There is mild dilatation of the ascending aorta, measuring 39 mm.  7. The inferior vena cava is dilated in size with <50% respiratory variability, suggesting right atrial pressure of 15 mmHg.  8. There is a filling defect adjacent to anteroapical wall on contrast images (best seen on images 104,105). Suspect this represents prominent LV trabeculation rather than thrombus. Would consider cardiac MRI both to rule out thrombus and evaluate for etiology of cardiomyopathy FINDINGS  Left Ventricle: Left ventricular ejection fraction, by estimation, is <20%. The left ventricle has severely decreased function. The left ventricle demonstrates global hypokinesis. Definity contrast agent was given IV to delineate the left ventricular endocardial borders. The left ventricular internal cavity size was normal in size. There is mild left ventricular hypertrophy. Left ventricular diastolic parameters are indeterminate. Right Ventricle: The right ventricular size is mildly enlarged. Right vetricular wall thickness was not well visualized. Right ventricular systolic function is moderately reduced. Tricuspid regurgitation signal is inadequate for assessing PA pressure. Left Atrium: Left atrial size was moderately dilated. Right Atrium: Right atrial size was normal in size. Pericardium: There is no evidence of pericardial effusion. Mitral Valve: The mitral valve is normal in structure. Trivial mitral valve regurgitation. Tricuspid Valve: The tricuspid valve is normal in structure. Tricuspid valve regurgitation is trivial. Aortic Valve: The aortic valve is tricuspid. Aortic valve regurgitation is not visualized. Mild to moderate aortic valve sclerosis/calcification is present, without any evidence of aortic stenosis. Pulmonic Valve: The pulmonic valve was not well visualized. Pulmonic valve regurgitation is not visualized. Aorta: The aortic root is normal in size and structure and aortic dilatation noted. There  is mild dilatation of the ascending aorta, measuring 39 mm. Venous: The inferior vena cava is dilated in size with less than 50% respiratory variability, suggesting right atrial pressure of 15 mmHg. IAS/Shunts: The interatrial septum was not well visualized.  LEFT VENTRICLE PLAX 2D LVIDd:         5.60 cm LVIDs:         5.40 cm LV PW:  1.40 cm LV IVS:        1.30 cm LVOT diam:     1.90 cm LV SV:         52 LV SV Index:   27 LVOT Area:     2.84 cm  LV Volumes (MOD) LV vol d, MOD A2C: 212.0 ml LV vol d, MOD A4C: 265.0 ml LV vol s, MOD A2C: 176.0 ml LV vol s, MOD A4C: 215.0 ml LV SV MOD A2C:     36.0 ml LV SV MOD A4C:     265.0 ml LV SV MOD BP:      48.5 ml RIGHT VENTRICLE            IVC RV S prime:     3.92 cm/s  IVC diam: 2.70 cm TAPSE (M-mode): 1.2 cm LEFT ATRIUM              Index       RIGHT ATRIUM           Index LA diam:        4.80 cm  2.48 cm/m  RA Area:     16.90 cm LA Vol (A2C):   51.9 ml  26.84 ml/m RA Volume:   46.20 ml  23.89 ml/m LA Vol (A4C):   111.0 ml 57.40 ml/m LA Biplane Vol: 84.8 ml  43.85 ml/m  AORTIC VALVE LVOT Vmax:   121.00 cm/s LVOT Vmean:  74.400 cm/s LVOT VTI:    0.182 m  AORTA Ao Root diam: 3.45 cm Ao Asc diam:  3.90 cm MITRAL VALVE MV Area (PHT): 4.85 cm    SHUNTS MV Decel Time: 157 msec    Systemic VTI:  0.18 m MV E velocity: 90.00 cm/s  Systemic Diam: 1.90 cm Oswaldo Milian MD Electronically signed by Oswaldo Milian MD Signature Date/Time: 06/01/2020/9:43:52 PM    Final     Patient Profile     75 year old male with past medical history of coronary artery disease status post coronary artery bypass and graft, 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  Chest CT shows no pulmonary embolus but there is mention of possible bronchiolitis and follow-up chest x-ray recommended in 3 to 4  weeks to rule out malignancy.  Also bilateral pleural effusions.  Echocardiogram shows ejection fraction less than 20%.  There is mild right ventricular enlargement, moderate left atrial enlargement, mildly dilated ascending aorta.  Assessment & Plan    1 acute systolic congestive heart failure-patient is markedly improved today following diuresis.  Decrease Lasix to 20 mg by mouth daily.  Add spironolactone 12.5 mg daily.  Follow potassium and renal function.  2 cardiomyopathy-LV function noted to be severely reduced.  Etiology unclear.  Question ischemia mediated versus history of chemotherapy (I did not have information concerning chemotherapeutic agents used). Patient did have acute dyspnea and troponin minimally elevated.  Question ischemic event causing acute presentation.  We will plan cardiac catheterization to see if there is a lesion amenable to PCI.  Patient would not be a good candidate for coronary artery bypass graft given question recurrence of pancreatic cancer.  The risk and benefits of cardiac catheterization including myocardial infarction, CVA and death discussed and he agrees to proceed.  We will add losartan 25 mg daily.  Can transition to [facility] if blood pressure allows.  Add carvedilol once acute CHF improves.  Titrate  medications as tolerated by pulse and blood pressure.  3 possible non-ST elevation myocardial infarction-troponin increased which may have been related to CHF but cannot exclude infarct.  Plan cardiac catheterization as outlined above.  4 history of pancreatic cancer status post Whipple-patient states there is possible recurrence and has been referred to radiation oncology.  For questions or updates, please contact [place] Please consult [email or web address] for contact info under        Signed, Kirk Ruths, MD  06/02/2020, 9:52 AM

## 2020-06-02 NOTE — Progress Notes (Signed)
Notified on-call hospitalist and cardiologist of change in status. Patient had episode of tachycardia at 136 with reported shortness of breath that felt "like it did before I came in". EKG obtained. Increased [place] O2 from 2L to 6L. Resolved spontaneously but returned within half an hour, this time without shortness of breath.

## 2020-06-02 NOTE — H&P (View-Only) (Signed)
Progress Note  Patient Name: Isaiah Patterson Date of Encounter: 06/02/2020  [facility] Cardiologist: Dr Marlou Porch  Subjective   No CP; dyspnea has improved  Inpatient Medications    Scheduled Meds: . aspirin EC  81 mg Oral Daily  . atorvastatin  80 mg Oral Daily  . enoxaparin (LOVENOX) injection  40 mg Subcutaneous Q24H  . furosemide  40 mg Intravenous BID  . isosorbide mononitrate  30 mg Oral Daily  . pantoprazole  40 mg Oral Daily  . potassium chloride  20 mEq Oral BID   Continuous Infusions: . sodium chloride     PRN Meds: sodium chloride, acetaminophen, morphine injection, ondansetron (ZOFRAN) IV, ondansetron (ZOFRAN) IV   Vital Signs    Vitals:   06/01/20 2049 06/02/20 0051 06/02/20 0439 06/02/20 0900  BP: 110/90 122/79 128/79 134/83  Pulse: (!) 50 98 (!) 49 (!) 43  Resp: 18 18 20  (!) 21  Temp: 98.4 F (36.9 C) 98.2 F (36.8 C) 98.6 F (37 C) 98.4 F (36.9 C)  TempSrc: Oral Oral Oral Oral  SpO2: 96% 92% 96% 94%  Weight:  80.2 kg    Height:        Intake/Output Summary (Last 24 hours) at 06/02/2020 0952 Last data filed at 06/02/2020 0843 Gross per 24 hour  Intake 480 ml  Output 2275 ml  Net -1795 ml   Last 3 Weights 06/02/2020 06/01/2020 04/02/2019  Weight (lbs) 176 lb 14.4 oz 180 lb 200 lb 9.9 oz  Weight (kg) 80.241 kg 81.647 kg 91 kg      Telemetry    Sinus - Personally Reviewed   Physical Exam   GEN: No acute distress.   Neck: No JVD Cardiac: RRR, no murmurs, rubs, or gallops.  Respiratory: Clear to auscultation bilaterally. GI: Soft, nontender, non-distended  MS: No edema Neuro:  Nonfocal  Psych: Normal affect   Labs    High Sensitivity Troponin:   Recent Labs  Lab 06/01/20 0322 06/01/20 0535 06/01/20 0918 06/01/20 2124 06/02/20 0450  TROPONINIHS 238* 573* 863* 1,664* 1,424*      Chemistry Recent Labs  Lab 06/01/20 0114 06/01/20 0307 06/02/20 0303  NA 144 142 141  K 4.3 3.5 3.3*  CL 107  --  100  CO2 17*  --  29   GLUCOSE 162*  --  112*  BUN 9  --  12  CREATININE 1.32*  --  1.16  CALCIUM 8.9  --  8.7*  PROT 5.9*  --  5.6*  ALBUMIN 3.0*  --  2.8*  AST 47*  --  47*  ALT 31  --  37  ALKPHOS 151*  --  163*  BILITOT 1.0  --  1.0  GFRNONAA 56*  --  >60  ANIONGAP 20*  --  12     Hematology Recent Labs  Lab 06/01/20 0114 06/01/20 0307 06/02/20 0303  WBC 10.4  --  7.7  RBC 3.84*  --  3.70*  HGB 11.8* 11.2* 11.8*  HCT 40.0 33.0* 34.5*  MCV 104.2*  --  93.2  MCH 30.7  --  31.9  MCHC 29.5*  --  34.2  RDW 14.4  --  14.6  PLT 323  --  267    BNP Recent Labs  Lab 06/01/20 0114  BNP 3,690.0*     Radiology    CT Angio Chest PE W/Cm &/Or Wo Cm  Result Date: 06/01/2020 CLINICAL DATA:  Chest pain, shortness of breath. EXAM: CT ANGIOGRAPHY CHEST WITH CONTRAST  TECHNIQUE: Multidetector CT imaging of the chest was performed using the standard protocol during bolus administration of intravenous contrast. Multiplanar CT image reconstructions and MIPs were obtained to evaluate the vascular anatomy. CONTRAST:  117mL OMNIPAQUE IOHEXOL 350 MG/ML SOLN COMPARISON:  Chest x-ray 06/01/2020 CT chest 04/27/2019. FINDINGS: Cardiovascular: Satisfactory opacification of the pulmonary arteries to the segmental level. No evidence of pulmonary embolism. Enlarged left heart. No significant pericardial effusion. The thoracic aorta is normal in caliber. Mild atherosclerotic plaque of the thoracic aorta. At least 3 vessel moderate to severe coronary artery calcifications. Mediastinum/Nodes: Borderline enlarged 1 cm right paratracheal lymph node. Similarly prominent right hilar lymph node. No enlarged mediastinal, hilar, or axillary lymph nodes. Trace debris within the trachea. Otherwise the thyroid gland, trachea, and esophagus demonstrate no significant findings. Lungs/Pleura: Bilateral trace pleural effusions, left greater than right. Bilateral lower lobe subsegmental and linear atelectasis. Linear atelectasis within the  lingula. Diffuse bronchial wall thickening. Centrilobular nodularity within bilateral upper lobes. Upper Abdomen: A splenule is noted.  No acute abnormality. Musculoskeletal: Marked bilateral gynecomastia. Mild subcutaneus soft tissue edema of the right chest wall and slightly asymmetric/hazy right pectoralis muscle. Acute comminuted right humeral head and neck fracture. Interval worsening of a T9 (greater than 75% height loss) and T12 (greater than 50% height loss) compression fracture. Similar-appearing anterior wedge shaped T8 (greater than 75% height loss) compression fracture. Several other vertebral bodies demonstrate vertebral height loss. Old healed bilateral rib fractures. No definite acute displaced rib fracture. Review of the MIP images confirms the above findings. IMPRESSION: 1. No pulmonary embolus. 2. Findings suggestive of bronchiolitis. Followup PA and lateral chest X-ray is recommended in 3-4 weeks following therapy to ensure resolution and exclude underlying malignancy. 3. Interval development of bilateral, left greater than right trace pleural effusions in the setting left cardiomegaly and three-vessel coronary artery calcifications. 4. Acute comminuted right humeral head and neck fracture. Associated right shoulder cutaneous soft tissue edema as well as right chest wall edema. Recommend dedicated right shoulder radiographs for further evaluation. 5. Interval worsening of T9 and T12 compression fractures. Correlate with point tenderness to palpation to evaluate for an acute component. Stable T8 compression fracture. 6. Aortic Atherosclerosis (ICD10-I70.0). Electronically Signed   By: Iven Finn M.D.   On: 06/01/2020 04:36   DG Chest Portable 1 View  Result Date: 06/01/2020 CLINICAL DATA:  Shortness of breath EXAM: PORTABLE CHEST 1 VIEW COMPARISON:  12/26/2019 FINDINGS: Cardiac shadow is enlarged in size. Postsurgical changes are noted. Right chest wall port is seen. Mild vascular  congestion is noted with interstitial edema. No focal infiltrate or sizable effusion is seen. No bony abnormality is noted. IMPRESSION: Mild changes of CHF. Electronically Signed   By: Inez Catalina M.D.   On: 06/01/2020 01:22   ECHOCARDIOGRAM COMPLETE  Result Date: 06/01/2020    ECHOCARDIOGRAM REPORT   Patient Name:   ALIF KLAPHAKE Moening Date of Exam: 06/01/2020 Medical Rec #:  [id number]      Height:       67.0 in Accession #:    [id number]     Weight:       180.0 lb Date of Birth:  August 05, 1945      BSA:          1.934 m Patient Age:    39 years       BP:           102/77 mmHg Patient Gender: M  HR:           76 bpm. Exam Location:  Inpatient Procedure: 2D Echo, Cardiac Doppler, Color Doppler and Intracardiac            Opacification Agent Indications:    R06.02 SOB  History:        Patient has no prior history of Echocardiogram examinations.                 CHF, CAD, Signs/Symptoms:Dyspnea, Shortness of Breath and Chest                 Pain; Risk Factors:Hypertension and Dyslipidemia. Cancer.  Sonographer:    Roseanna Rainbow RDCS Referring Phys: [id number] TIMOTHY S OPYD  Sonographer Comments: Technically difficult study due to poor echo windows. IMPRESSIONS  1. Left ventricular ejection fraction, by estimation, is <20%. The left ventricle has severely decreased function. The left ventricle demonstrates global hypokinesis. There is mild left ventricular hypertrophy. Left ventricular diastolic parameters are indeterminate.  2. Right ventricular systolic function is moderately reduced. The right ventricular size is mildly enlarged. Tricuspid regurgitation signal is inadequate for assessing PA pressure.  3. Left atrial size was moderately dilated.  4. The mitral valve is normal in structure. Trivial mitral valve regurgitation.  5. The aortic valve is tricuspid. Aortic valve regurgitation is not visualized. Mild to moderate aortic valve sclerosis/calcification is present, without any evidence of aortic stenosis.  6.  Aortic dilatation noted. There is mild dilatation of the ascending aorta, measuring 39 mm.  7. The inferior vena cava is dilated in size with <50% respiratory variability, suggesting right atrial pressure of 15 mmHg.  8. There is a filling defect adjacent to anteroapical wall on contrast images (best seen on images 104,105). Suspect this represents prominent LV trabeculation rather than thrombus. Would consider cardiac MRI both to rule out thrombus and evaluate for etiology of cardiomyopathy FINDINGS  Left Ventricle: Left ventricular ejection fraction, by estimation, is <20%. The left ventricle has severely decreased function. The left ventricle demonstrates global hypokinesis. Definity contrast agent was given IV to delineate the left ventricular endocardial borders. The left ventricular internal cavity size was normal in size. There is mild left ventricular hypertrophy. Left ventricular diastolic parameters are indeterminate. Right Ventricle: The right ventricular size is mildly enlarged. Right vetricular wall thickness was not well visualized. Right ventricular systolic function is moderately reduced. Tricuspid regurgitation signal is inadequate for assessing PA pressure. Left Atrium: Left atrial size was moderately dilated. Right Atrium: Right atrial size was normal in size. Pericardium: There is no evidence of pericardial effusion. Mitral Valve: The mitral valve is normal in structure. Trivial mitral valve regurgitation. Tricuspid Valve: The tricuspid valve is normal in structure. Tricuspid valve regurgitation is trivial. Aortic Valve: The aortic valve is tricuspid. Aortic valve regurgitation is not visualized. Mild to moderate aortic valve sclerosis/calcification is present, without any evidence of aortic stenosis. Pulmonic Valve: The pulmonic valve was not well visualized. Pulmonic valve regurgitation is not visualized. Aorta: The aortic root is normal in size and structure and aortic dilatation noted. There  is mild dilatation of the ascending aorta, measuring 39 mm. Venous: The inferior vena cava is dilated in size with less than 50% respiratory variability, suggesting right atrial pressure of 15 mmHg. IAS/Shunts: The interatrial septum was not well visualized.  LEFT VENTRICLE PLAX 2D LVIDd:         5.60 cm LVIDs:         5.40 cm LV PW:  1.40 cm LV IVS:        1.30 cm LVOT diam:     1.90 cm LV SV:         52 LV SV Index:   27 LVOT Area:     2.84 cm  LV Volumes (MOD) LV vol d, MOD A2C: 212.0 ml LV vol d, MOD A4C: 265.0 ml LV vol s, MOD A2C: 176.0 ml LV vol s, MOD A4C: 215.0 ml LV SV MOD A2C:     36.0 ml LV SV MOD A4C:     265.0 ml LV SV MOD BP:      48.5 ml RIGHT VENTRICLE            IVC RV S prime:     3.92 cm/s  IVC diam: 2.70 cm TAPSE (M-mode): 1.2 cm LEFT ATRIUM              Index       RIGHT ATRIUM           Index LA diam:        4.80 cm  2.48 cm/m  RA Area:     16.90 cm LA Vol (A2C):   51.9 ml  26.84 ml/m RA Volume:   46.20 ml  23.89 ml/m LA Vol (A4C):   111.0 ml 57.40 ml/m LA Biplane Vol: 84.8 ml  43.85 ml/m  AORTIC VALVE LVOT Vmax:   121.00 cm/s LVOT Vmean:  74.400 cm/s LVOT VTI:    0.182 m  AORTA Ao Root diam: 3.45 cm Ao Asc diam:  3.90 cm MITRAL VALVE MV Area (PHT): 4.85 cm    SHUNTS MV Decel Time: 157 msec    Systemic VTI:  0.18 m MV E velocity: 90.00 cm/s  Systemic Diam: 1.90 cm Oswaldo Milian MD Electronically signed by Oswaldo Milian MD Signature Date/Time: 06/01/2020/9:43:52 PM    Final     Patient Profile     75 year old male with past medical history of coronary artery disease status post coronary artery bypass and graft, 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  Chest CT shows no pulmonary embolus but there is mention of possible bronchiolitis and follow-up chest x-ray recommended in 3 to 4  weeks to rule out malignancy.  Also bilateral pleural effusions.  Echocardiogram shows ejection fraction less than 20%.  There is mild right ventricular enlargement, moderate left atrial enlargement, mildly dilated ascending aorta.  Assessment & Plan    1 acute systolic congestive heart failure-patient is markedly improved today following diuresis.  Decrease Lasix to 20 mg by mouth daily.  Add spironolactone 12.5 mg daily.  Follow potassium and renal function.  2 cardiomyopathy-LV function noted to be severely reduced.  Etiology unclear.  Question ischemia mediated versus history of chemotherapy (I did not have information concerning chemotherapeutic agents used). Patient did have acute dyspnea and troponin minimally elevated.  Question ischemic event causing acute presentation.  We will plan cardiac catheterization to see if there is a lesion amenable to PCI.  Patient would not be a good candidate for coronary artery bypass graft given question recurrence of pancreatic cancer.  The risk and benefits of cardiac catheterization including myocardial infarction, CVA and death discussed and he agrees to proceed.  We will add losartan 25 mg daily.  Can transition to [facility] if blood pressure allows.  Add carvedilol once acute CHF improves.  Titrate  medications as tolerated by pulse and blood pressure.  3 possible non-ST elevation myocardial infarction-troponin increased which may have been related to CHF but cannot exclude infarct.  Plan cardiac catheterization as outlined above.  4 history of pancreatic cancer status post Whipple-patient states there is possible recurrence and has been referred to radiation oncology.  For questions or updates, please contact [place] Please consult [email or web address] for contact info under        Signed, Kirk Ruths, MD  06/02/2020, 9:52 AM

## 2020-06-02 NOTE — Progress Notes (Addendum)
Floor coverage overnight event  Notified by RN that patient became tachycardic with heart rate in the 130s and complained of shortness of breath.  Denied chest pain.  Not hypoxic but oxygen increased from 2 L to 6 L for comfort.  Symptoms have now resolved.  Heart rate currently in the 90s.  EKG done at the time showing sinus arrhythmia with PACs and occasional PVCs, QTC 545.  Also showing new T wave inversions in V1-3.  CT angiogram chest done yesterday showing no PE.  LHC done earlier today revealed severe multivessel CAD and medical management was recommended.  -Stat chest x-ray ordered -Stat high-sensitivity troponin -Stat labs to check potassium and magnesium levels.  Keep K >4 and Mag >2.  -Continue cardiac monitoring  Addendum: Chest x-ray showed no active disease.  High-sensitivity troponin 643, improved since labs done over the past 2 days when it had peaked at 1664.  Given new T wave inversions on EKG, discussed the case with Dr. Laurice Record from cardiology who feels that EKG changes are likely related to cardiac catheterization done yesterday.  Since patient is not endorsing chest pain, he recommends not trending troponin any further.

## 2020-06-02 NOTE — Interval H&P Note (Signed)
History and Physical Interval Note:  06/02/2020 2:40 PM  Isaiah Patterson  has presented today for surgery, with the diagnosis of NSTEMI and acute HFrEF.  The various methods of treatment have been discussed with the patient and family. After consideration of risks, benefits and other options for treatment, the patient has consented to  Procedure(s): LEFT HEART CATH AND CORS/GRAFTS ANGIOGRAPHY (N/A) as a surgical intervention.  The patient's history has been reviewed, patient examined, no change in status, stable for surgery.  I have reviewed the patient's chart and labs.  Questions were answered to the patient's satisfaction.    Cath Lab Visit (complete for each Cath Lab visit)  Clinical Evaluation Leading to the Procedure:   ACS: Yes.    Non-ACS:  N/A  Boy Delamater

## 2020-06-02 NOTE — Progress Notes (Signed)
Heart Failure [name] Pharmacist Progress Note   PCP: [facility] PCP-Cardiologist: No primary care provider on file.    HPI:  75 yo M with PMH of CAD s/p CABG, pancreatic adenocarcinoma s/p Whipple, HTN, HLD, and CHF.  He presented to the ED with shortness of breath and has been admitted for acute CHF. An ECHO was done on 06/01/20 and LVEF <20% (reported 55% at last ECHO with [facility]). Pending LHC.    Current HF Medications: Furosemide 20 mg daily Losartan 25 mg daily Spironolactone 12.5 mg daily  Prior to admission HF Medications: Metoprolol XL 25 mg daily Losartan 50 mg daily Spironolactone 25 mg daily Isordil 30 mg daily  Pertinent Lab Values: . Serum creatinine 1.16, BUN 12, Potassium 3.3, Sodium 141, BNP 3690, Magnesium 1.8   Vital Signs: . Weight: 176 lbs (admission weight: 180 lbs) . Blood pressure: 130/80s  . Heart rate: 80s   Medication Assistance / Insurance Benefits Check: Does the patient have prescription insurance?  Yes Type of insurance plan: [place]  Outpatient Pharmacy:  Prior to admission outpatient pharmacy: [facility]   Assessment: 1. Acute on chronic systolic CHF (EF <76%), LHC pending. NYHA class II symptoms. - Agree with transitioning to furosemide 20 mg daily. Potassium replacement ordered. - Consider restarting metoprolol XL 25 mg daily prior to discharge - Restarted losartan 25 mg daily today. Consider transitioning to Entresto 24/26 mg BID pending BP trends (preferably after cath to avoid any further renal insult) - Continue spironolactone 12.5 mg daily. Consider resuming 25 mg daily prior to discharge. - Consider starting Jardiance 10 mg daily Wilder Glade is not on the [place]) prior to discharge    Plan: 1) Medication changes recommended at this time: - Increase spironolactone to 25 mg daily - Restart metoprolol XL 25 mg daily   2) Patient assistance: - None pending - [facility] formul[facility] Entresto and Jardiance  approved  3)  Education  - To be completed prior to discharge  Kerby Nora, PharmD, BCPS Heart Failure Cytogeneticist Phone [phone]

## 2020-06-03 ENCOUNTER — Encounter (HOSPITAL_COMMUNITY): Payer: Self-pay | Admitting: Internal Medicine

## 2020-06-03 DIAGNOSIS — S42201D Unspecified fracture of upper end of right humerus, subsequent encounter for fracture with routine healing: Secondary | ICD-10-CM | POA: Diagnosis not present

## 2020-06-03 DIAGNOSIS — N289 Disorder of kidney and ureter, unspecified: Secondary | ICD-10-CM

## 2020-06-03 DIAGNOSIS — D136 Benign neoplasm of pancreas: Secondary | ICD-10-CM

## 2020-06-03 DIAGNOSIS — I5021 Acute systolic (congestive) heart failure: Secondary | ICD-10-CM | POA: Diagnosis not present

## 2020-06-03 DIAGNOSIS — I214 Non-ST elevation (NSTEMI) myocardial infarction: Secondary | ICD-10-CM

## 2020-06-03 DIAGNOSIS — I2510 Atherosclerotic heart disease of native coronary artery without angina pectoris: Secondary | ICD-10-CM

## 2020-06-03 DIAGNOSIS — S22000S Wedge compression fracture of unspecified thoracic vertebra, sequela: Secondary | ICD-10-CM

## 2020-06-03 LAB — BASIC METABOLIC PANEL
Anion gap: 11 (ref 5–15)
BUN: 14 mg/dL (ref 8–23)
CO2: 27 mmol/L (ref 22–32)
Calcium: 8.7 mg/dL — ABNORMAL LOW (ref 8.9–10.3)
Chloride: 101 mmol/L (ref 98–111)
Creatinine, Ser: 1.06 mg/dL (ref 0.61–1.24)
GFR, Estimated: >60 mL/min (ref >60)
Glucose, Bld: 108 mg/dL — ABNORMAL HIGH (ref 70–99)
Potassium: 3.9 mmol/L (ref 3.5–5.1)
Sodium: 139 mmol/L (ref 135–145)

## 2020-06-03 LAB — CBC
HCT: 35.8 % — ABNORMAL LOW (ref 39.0–52.0)
Hemoglobin: 11.5 g/dL — ABNORMAL LOW (ref 13.0–17.0)
MCH: 30.8 pg (ref 26.0–34.0)
MCHC: 32.1 g/dL (ref 30.0–36.0)
MCV: 96 fL (ref 80.0–100.0)
Platelets: 372 10*3/uL (ref 150–400)
RBC: 3.73 MIL/uL — ABNORMAL LOW (ref 4.22–5.81)
RDW: 14.6 % (ref 11.5–15.5)
WBC: 7.7 10*3/uL (ref 4.0–10.5)
nRBC: 0 % (ref 0.0–0.2)

## 2020-06-03 LAB — MAGNESIUM: Magnesium: 2.3 mg/dL (ref 1.7–2.4)

## 2020-06-03 LAB — TROPONIN I (HIGH SENSITIVITY): Troponin I (High Sensitivity): 643 ng/L (ref <18)

## 2020-06-03 MED ORDER — CARVEDILOL 6.25 MG PO TABS: 6.2500 mg | ORAL_TABLET | Freq: Two times a day (BID) | ORAL | Status: DC

## 2020-06-03 MED ORDER — LOSARTAN POTASSIUM 25 MG PO TABS: 25.0000 mg | ORAL_TABLET | Freq: Every day | ORAL | 0 refills | Status: DC

## 2020-06-03 MED ORDER — FUROSEMIDE 20 MG PO TABS: 20.0000 mg | ORAL_TABLET | Freq: Every day | ORAL | 0 refills | Status: DC

## 2020-06-03 MED ORDER — CARVEDILOL 6.25 MG PO TABS: 6.2500 mg | ORAL_TABLET | Freq: Two times a day (BID) | ORAL | 0 refills | Status: DC

## 2020-06-03 MED ORDER — POTASSIUM CHLORIDE CRYS ER 20 MEQ PO TBCR: 20.0000 meq | EXTENDED_RELEASE_TABLET | Freq: Every day | ORAL | 0 refills | Status: DC

## 2020-06-03 MED ORDER — SPIRONOLACTONE 25 MG PO TABS: 12.5000 mg | ORAL_TABLET | Freq: Every day | ORAL | 0 refills | Status: DC

## 2020-06-03 MED ORDER — CLOPIDOGREL BISULFATE 75 MG PO TABS: 75.0000 mg | ORAL_TABLET | Freq: Every day | ORAL | 2 refills | Status: DC

## 2020-06-03 MED FILL — Heparin Sod (Porcine)-NaCl IV Soln 1000 Unit/500ML-0.9%: INTRAVENOUS | Qty: 500 | Status: AC

## 2020-06-03 NOTE — Discharge Summary (Signed)
Physician Discharge Summary  Isaiah Patterson OIZ:[id number] DOB: 05/20/1945 DOA: 06/01/2020  PCP: [facility]  Admit date: 06/01/2020 Discharge date: 06/03/2020  Time spent: 45 minutes  Recommendations for Outpatient Follow-up:  Patient will be discharged to home.  Patient will need to follow up with primary care provider within one week of discharge, repeat CBC and BMP. Follow up with cardiology.  Patient should continue medications as prescribed.  Patient should follow a heart healthy diet.   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 Edmonia Caprio Patterson is a 75 y.o. male with medical history significant for CAD status post remote CABG, pancreatic adenocarcinoma status post Whipple procedure with recent evidence for recurrence, fall 2 weeks ago with thoracic compression and proximal right humerus fractures managed conservatively, now presenting to the emergency department for evaluation of acute onset shortness of breath.  Patient is accompanied by his fiance who assists with the history.  He reports having a normal day on 05/31/2020 until shortly after lying down to sleep when he developed acute onset shortness of breath.  He was noted to be diaphoretic and pale with labored respirations.  Patient describes feeling as though he was going to die.  He denies associated nausea or chest pain.  Reports that he has had some mild exertional dyspnea with cough productive of scant white sputum for approximately 1 month but denies any fevers or chills.  Patient reports some mild bilateral lower leg swelling in the past month.     He had slipped on some ice on 05/18/2020, developed severe pain in his proximal right arm and back, was found to have thoracic vertebral compression fractures and right humeral head and neck fracture which has been managed conservatively by [facility] in [place].  For pancreatic adenocarcinoma, he had Whipple procedure in January 2021, completed chemotherapy in October, is followed by oncology at the [facility] and did not have any evidence for recurrence until a recent scan and is now scheduled for consultation with radiation oncologist next week.  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  Mild LVH.  Diastolic parameters indeterminate. -Patient was placed on IV Lasix -Breathing and orthopnea improved -Cardiology consulted and appreciated-recommended continuing Lasix 20 mg as well as spironolactone.  May start [facility] as an outpatient.  Continue low-dose Coreg.  Patient is not a good candidate for ICD given pancreatic cancer  Demand ischemia versus NSTEMI/CAD -troponin peaked to 1664 -Cardiology following -Status post cardiac catheterization which showed severe native coronary disease, medical therapy recommended.  Continue dual antiplatelet therapy with aspirin and clopidogrel for 12 months as tolerated -Patient with no chest pain today  Pancreatic adenocarcinoma -Patient follows with oncology at the [facility]  Acute kidney injury -Baseline creatinine approximately 0.8, creatinine peaked to 1.3  -Suspect secondary to diuresis  GERD -Continue PPI  Hypothyroidism  -continue synthroid   Procedures: Echocardiogram Cardiac catheterization  Consultations: Cardiology  Discharge Exam: Vitals:   06/03/20 0851 06/03/20 0900  BP: 116/80    Pulse: Marland Kitchen)  58 75  Resp: 20   Temp:    SpO2: 96%      General: Well developed, elderly, NAD  HEENT: NCAT,  mucous membranes moist.  Cardiovascular: S1 S2 auscultated, RRR  Respiratory: Clear to auscultation bilaterally with equal chest rise  Abdomen: Soft, nontender, nondistended, + bowel sounds  Extremities: warm dry without cyanosis clubbing or edema  Neuro: AAOx3, nonfocal  Psych: appropriate mood and affect, pleasant  Discharge Instructions Discharge Instructions    AMB Referral to Cardiac Rehabilitation - Phase II   Complete by: As directed    Diagnosis: NSTEMI   After initial evaluation and assessments completed: Virtual Based Care may be provided alone or in conjunction with Phase 2 Cardiac Rehab based on patient barriers.: Yes   Diet - low sodium heart healthy   Complete by: As directed    Discharge instructions   Complete by: As directed    Patient will be discharged to home.  Patient will need to follow up with primary care provider within one week of discharge, repeat CBC and BMP. Follow up with cardiology.  Patient should continue medications as prescribed.  Patient should follow a heart healthy diet.   Increase activity slowly   Complete by: As directed      Allergies as of 06/03/2020      Reactions   Shellfish Allergy Hives   Trazodone And Nefazodone    Unknown      Medication List    STOP taking these medications   metoprolol succinate 25 MG 24 hr tablet Commonly known as: TOPROL-XL     TAKE these medications   aspirin EC 81 MG tablet Take 1 tablet (81 mg total) by mouth daily.   atorvastatin 80 MG tablet Commonly known as: LIPITOR Take 80 mg by mouth daily.   calcium carbonate 600 MG Tabs tablet Commonly known as: OS-CAL Take 600 mg by mouth daily.   carvedilol 6.25 MG tablet Commonly known as: COREG Take 1 tablet (6.25 mg total) by mouth 2 (two) times daily with a meal.   clopidogrel 75 MG tablet Commonly known as: PLAVIX Take 1  tablet (75 mg total) by mouth daily with breakfast. Start taking on: June 04, 2020   DULoxetine 30 MG capsule Commonly known as: CYMBALTA Take 30 mg by mouth daily.   famotidine 20 MG tablet Commonly known as: PEPCID Take 20 mg by mouth 2 (two) times daily.   ferrous gluconate 324 MG tablet Commonly known as: FERGON Take 324 mg by mouth daily with breakfast.   ferrous sulfate 325 (65 FE) MG tablet Take 325 mg by mouth daily with breakfast.   furosemide 20 MG tablet Commonly known as: LASIX Take 1 tablet (20 mg total) by mouth daily. Start taking on: June 04, 2020   gabapentin 600 MG tablet Commonly known as: NEURONTIN Take 600 mg by mouth 3 (three) times daily.   isosorbide dinitrate 30 MG tablet Commonly known as: ISORDIL Take 30 mg by mouth daily.   levothyroxine 75 MCG tablet Commonly known as: SYNTHROID Take 75 mcg by mouth daily before breakfast.   losartan 25 MG tablet Commonly known as: COZAAR Take 1 tablet (25 mg total) by mouth daily. Start taking on: June 04, 2020 What changed:   medication strength  how much to take   multivitamin with minerals Tabs tablet Take 1 tablet by mouth daily.   nitroGLYCERIN 0.4 MG SL tablet Commonly known as: Nitrostat Place 1 tablet (0.4 mg total) under the tongue every 5 (  five) minutes as needed for chest pain.   omeprazole 40 MG capsule Commonly known as: PRILOSEC Take 40 mg by mouth daily.   potassium chloride SA 20 MEQ tablet Commonly known as: KLOR-CON Take 1 tablet (20 mEq total) by mouth daily.   spironolactone 25 MG tablet Commonly known as: ALDACTONE Take 0.5 tablets (12.5 mg total) by mouth daily. Start taking on: June 04, 2020 What changed: how much to take   VITAMIN D PO Take 1 tablet by mouth daily.      Allergies  Allergen Reactions  . Shellfish Allergy Hives  . Trazodone And Nefazodone     Unknown    Follow-up Information    [facility]. Go on 06/06/2020.   Specialty: Cardiology Why: AT 9AM with [facility].  You will see a medical provider and pharmacist, this appointment is 1 hour [place] Please bring you weight log and all medications with you.  Contact information: [address] [id number] m[place] [phone]       [place] .   Specialty: Cardiology Contact information: [place] [address] [facility], [place] Schedule an appointment as soon as possible for a visit in 1 week(s).   Why: Hospital follow up Contact information: [place] [address] [phone]                The results of significant diagnostics from this hospitalization (including imaging, microbiology, ancillary and laboratory) are listed below for reference.    Significant Diagnostic Studies: CT Angio Chest PE W/Cm &/Or Wo Cm  Result Date: 06/01/2020 CLINICAL DATA:  Chest pain, shortness of breath. EXAM: CT ANGIOGRAPHY CHEST WITH CONTRAST TECHNIQUE: Multidetector CT imaging of the chest was performed using the standard protocol during bolus administration of intravenous contrast. Multiplanar CT image reconstructions and MIPs were obtained to evaluate the vascular anatomy. CONTRAST:  121mL OMNIPAQUE IOHEXOL 350 MG/ML SOLN COMPARISON:  Chest x-ray 06/01/2020 CT chest 04/27/2019. FINDINGS: Cardiovascular: Satisfactory opacification of the pulmonary arteries to the segmental level. No evidence of pulmonary embolism. Enlarged left heart. No significant pericardial effusion. The thoracic aorta is normal in caliber. Mild atherosclerotic plaque of the thoracic aorta. At least 3 vessel moderate to severe coronary artery calcifications. Mediastinum/Nodes: Borderline enlarged 1 cm right paratracheal lymph node. Similarly prominent right hilar lymph  node. No enlarged mediastinal, hilar, or axillary lymph nodes. Trace debris within the trachea. Otherwise the thyroid gland, trachea, and esophagus demonstrate no significant findings. Lungs/Pleura: Bilateral trace pleural effusions, left greater than right. Bilateral lower lobe subsegmental and linear atelectasis. Linear atelectasis within the lingula. Diffuse bronchial wall thickening. Centrilobular nodularity within bilateral upper lobes. Upper Abdomen: A splenule is noted.  No acute abnormality. Musculoskeletal: Marked bilateral gynecomastia. Mild subcutaneus soft tissue edema of the right chest wall and slightly asymmetric/hazy right pectoralis muscle. Acute comminuted right humeral head and neck fracture. Interval worsening of a T9 (greater than 75% height loss) and T12 (greater than 50% height loss) compression fracture. Similar-appearing anterior wedge shaped T8 (greater than 75% height loss) compression fracture. Several other vertebral bodies demonstrate vertebral height loss. Old healed bilateral rib fractures. No definite acute displaced rib fracture. Review of the MIP images confirms the above findings. IMPRESSION: 1. No pulmonary embolus. 2. Findings suggestive of bronchiolitis. Followup PA and lateral chest X-ray is recommended in 3-4 weeks  following therapy to ensure resolution and exclude underlying malignancy. 3. Interval development of bilateral, left greater than right trace pleural effusions in the setting left cardiomegaly and three-vessel coronary artery calcifications. 4. Acute comminuted right humeral head and neck fracture. Associated right shoulder cutaneous soft tissue edema as well as right chest wall edema. Recommend dedicated right shoulder radiographs for further evaluation. 5. Interval worsening of T9 and T12 compression fractures. Correlate with point tenderness to palpation to evaluate for an acute component. Stable T8 compression fracture. 6. Aortic Atherosclerosis (ICD10-I70.0).  Electronically Signed   By: Iven Finn M.D.   On: 06/01/2020 04:36   CARDIAC CATHETERIZATION  Result Date: 06/02/2020 Conclusions: 1. Severe native coronary artery disease, including chronic total occlusions of the ostial LAD and OM1, as well as 60% and 80% ostial/proximal ramus lesions, 80% distal LCx stenosis, and diffuse mid RCA disease of up to 60-70% with heavy calcification.  Distal LCx lesion has worsened since last catheterization in 2014; otherwise the has been no significant change. 2. Widely patent LIMA-LAD, SVG-D2, and SVG-OM1. 3. Atretic free RIMA-rPDA is unchanged since 2014. 4. Low normal left ventricular filling pressure (LVEDP ~5 mmHg). 5. Paroxysmal SVT noted during left heart catheterization, which was self-limited and lasted < 5 minutes. Recommendations: 1. 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  Severely reduced LVEF appears out of proportion to degree of coronary artery disease.  I will add carvedilol 3.125 mg twice daily. 2. Maintain net even fluid balance. 3. Dual antiplatelet therapy with aspirin and clopidogrel for up to 12 months, as tolerated. 4. If the patient has recurrent symptoms, PCI to distal LCx could be considered, as well as functional assessment of RCA and ramus intermedius disease. 5. Aggressive secondary prevention. Nelva Bush, MD [facility]   DG CHEST PORT 1 VIEW  Result Date: 06/02/2020 CLINICAL DATA:  Shortness of breath. EXAM: PORTABLE CHEST 1 VIEW COMPARISON:  06/01/2020 FINDINGS: The heart size is enlarged. The patient is status post prior CABG. There is a right-sided Port-A-Cath is stable in well position. There is no pneumothorax. No large pleural effusion or focal infiltrate. IMPRESSION: No active disease. Electronically Signed   By: Constance Holster M.D.   On: 06/02/2020 23:32   DG Chest Portable 1 View  Result Date: 06/01/2020 CLINICAL DATA:  Shortness of breath EXAM: PORTABLE CHEST 1 VIEW COMPARISON:   12/26/2019 FINDINGS: Cardiac shadow is enlarged in size. Postsurgical changes are noted. Right chest wall port is seen. Mild vascular congestion is noted with interstitial edema. No focal infiltrate or sizable effusion is seen. No bony abnormality is noted. IMPRESSION: Mild changes of CHF. Electronically Signed   By: Inez Catalina M.D.   On: 06/01/2020 01:22   ECHOCARDIOGRAM COMPLETE  Result Date: 06/01/2020    ECHOCARDIOGRAM REPORT   Patient Name:   BRANON PETROSINO Flight Date of Exam: 06/01/2020 Medical Rec #:  [id number]      Height:       67.0 in Accession #:    [id number]     Weight:       180.0 lb Date of Birth:  05-Dec-1945      BSA:          1.934 m Patient Age:    63 years       BP:           102/77 mmHg Patient Gender: M              HR:  76 bpm. Exam Location:  Inpatient Procedure: 2D Echo, Cardiac Doppler, Color Doppler and Intracardiac            Opacification Agent Indications:    R06.02 SOB  History:        Patient has no prior history of Echocardiogram examinations.                 CHF, CAD, Signs/Symptoms:Dyspnea, Shortness of Breath and Chest                 Pain; Risk Factors:Hypertension and Dyslipidemia. Cancer.  Sonographer:    Roseanna Rainbow RDCS Referring Phys: [id number] TIMOTHY S OPYD  Sonographer Comments: Technically difficult study due to poor echo windows. IMPRESSIONS  1. Left ventricular ejection fraction, by estimation, is <20%. The left ventricle has severely decreased function. The left ventricle demonstrates global hypokinesis. There is mild left ventricular hypertrophy. Left ventricular diastolic parameters are indeterminate.  2. Right ventricular systolic function is moderately reduced. The right ventricular size is mildly enlarged. Tricuspid regurgitation signal is inadequate for assessing PA pressure.  3. Left atrial size was moderately dilated.  4. The mitral valve is normal in structure. Trivial mitral valve regurgitation.  5. The aortic valve is tricuspid. Aortic valve regurgitation  is not visualized. Mild to moderate aortic valve sclerosis/calcification is present, without any evidence of aortic stenosis.  6. Aortic dilatation noted. There is mild dilatation of the ascending aorta, measuring 39 mm.  7. The inferior vena cava is dilated in size with <50% respiratory variability, suggesting right atrial pressure of 15 mmHg.  8. There is a filling defect adjacent to anteroapical wall on contrast images (best seen on images 104,105). Suspect this represents prominent LV trabeculation rather than thrombus. Would consider cardiac MRI both to rule out thrombus and evaluate for etiology of cardiomyopathy FINDINGS  Left Ventricle: Left ventricular ejection fraction, by estimation, is <20%. The left ventricle has severely decreased function. The left ventricle demonstrates global hypokinesis. Definity contrast agent was given IV to delineate the left ventricular endocardial borders. The left ventricular internal cavity size was normal in size. There is mild left ventricular hypertrophy. Left ventricular diastolic parameters are indeterminate. Right Ventricle: The right ventricular size is mildly enlarged. Right vetricular wall thickness was not well visualized. Right ventricular systolic function is moderately reduced. Tricuspid regurgitation signal is inadequate for assessing PA pressure. Left Atrium: Left atrial size was moderately dilated. Right Atrium: Right atrial size was normal in size. Pericardium: There is no evidence of pericardial effusion. Mitral Valve: The mitral valve is normal in structure. Trivial mitral valve regurgitation. Tricuspid Valve: The tricuspid valve is normal in structure. Tricuspid valve regurgitation is trivial. Aortic Valve: The aortic valve is tricuspid. Aortic valve regurgitation is not visualized. Mild to moderate aortic valve sclerosis/calcification is present, without any evidence of aortic stenosis. Pulmonic Valve: The pulmonic valve was not well visualized. Pulmonic  valve regurgitation is not visualized. Aorta: The aortic root is normal in size and structure and aortic dilatation noted. There is mild dilatation of the ascending aorta, measuring 39 mm. Venous: The inferior vena cava is dilated in size with less than 50% respiratory variability, suggesting right atrial pressure of 15 mmHg. IAS/Shunts: The interatrial septum was not well visualized.  LEFT VENTRICLE PLAX 2D LVIDd:         5.60 cm LVIDs:         5.40 cm LV PW:         1.40 cm LV IVS:  1.30 cm LVOT diam:     1.90 cm LV SV:         52 LV SV Index:   27 LVOT Area:     2.84 cm  LV Volumes (MOD) LV vol d, MOD A2C: 212.0 ml LV vol d, MOD A4C: 265.0 ml LV vol s, MOD A2C: 176.0 ml LV vol s, MOD A4C: 215.0 ml LV SV MOD A2C:     36.0 ml LV SV MOD A4C:     265.0 ml LV SV MOD BP:      48.5 ml RIGHT VENTRICLE            IVC RV S prime:     3.92 cm/s  IVC diam: 2.70 cm TAPSE (M-mode): 1.2 cm LEFT ATRIUM              Index       RIGHT ATRIUM           Index LA diam:        4.80 cm  2.48 cm/m  RA Area:     16.90 cm LA Vol (A2C):   51.9 ml  26.84 ml/m RA Volume:   46.20 ml  23.89 ml/m LA Vol (A4C):   111.0 ml 57.40 ml/m LA Biplane Vol: 84.8 ml  43.85 ml/m  AORTIC VALVE LVOT Vmax:   121.00 cm/s LVOT Vmean:  74.400 cm/s LVOT VTI:    0.182 m  AORTA Ao Root diam: 3.45 cm Ao Asc diam:  3.90 cm MITRAL VALVE MV Area (PHT): 4.85 cm    SHUNTS MV Decel Time: 157 msec    Systemic VTI:  0.18 m MV E velocity: 90.00 cm/s  Systemic Diam: 1.90 cm Epifanio Lesches MD Electronically signed by Epifanio Lesches MD Signature Date/Time: 06/01/2020/9:43:52 PM    Final     Microbiology: Recent Results (from the past 240 hour(s))  SARS Coronavirus 2 by RT PCR (hospital order, performed in [facility] hospital lab) Nasopharyngeal Nasopharyngeal Swab     Status: None   Collection Time: 06/01/20  1:14 AM   Specimen: Nasopharyngeal Swab  Result Value Ref Range Status   SARS Coronavirus 2 NEGATIVE NEGATIVE Final    Comment:  (NOTE) SARS-CoV-2 target nucleic acids are NOT DETECTED.  The SARS-CoV-2 RNA is generally detectable in upper and lower respiratory specimens during the acute phase of infection. The lowest concentration of SARS-CoV-2 viral copies this assay can detect is 250 copies / mL. A negative result does not preclude SARS-CoV-2 infection and should not be used as the sole basis for treatment or other patient management decisions.  A negative result may occur with improper specimen collection / handling, submission of specimen other than nasopharyngeal swab, presence of viral mutation(s) within the areas targeted by this assay, and inadequate number of viral copies (<250 copies / mL). A negative result must be combined with clinical observations, patient history, and epidemiological information.  Fact Sheet for Patients:   BoilerBrush.com.cy  Fact Sheet for Healthcare Providers: https://pope.com/  This test is not yet approved or  cleared by the [place] FDA and has been authorized for detection and/or diagnosis of SARS-CoV-2 by FDA under an Emergency Use Authorization (EUA).  This EUA will remain in effect (meaning this test can be used) for the duration of the COVID-19 declaration under Section 564(b)(1) of the Act, 21 U.S.C. section 360bbb-3(b)(1), unless the authorization is terminated or revoked sooner.  Performed at [facility], [address], [place], [place] [address]  Labs: Basic Metabolic Panel: Recent Labs  Lab 06/01/20 0114 06/01/20 0307 06/02/20 0303 06/03/20 0004  NA 144 142 141 139  K 4.3 3.5 3.3* 3.9  CL 107  --  100 101  CO2 17*  --  29 27  GLUCOSE 162*  --  112* 108*  BUN 9  --  12 14  CREATININE 1.32*  --  1.16 1.06  CALCIUM 8.9  --  8.7* 8.7*  MG  --   --  1.8 2.3   Liver Function Tests: Recent Labs  Lab 06/01/20 0114 06/02/20 0303  AST 47* 47*  ALT 31 37  ALKPHOS 151* 163*  BILITOT  1.0 1.0  PROT 5.9* 5.6*  ALBUMIN 3.0* 2.8*   No results for input(s): LIPASE, AMYLASE in the last 168 hours. No results for input(s): AMMONIA in the last 168 hours. CBC: Recent Labs  Lab 06/01/20 0114 06/01/20 0307 06/02/20 0303 06/03/20 0004  WBC 10.4  --  7.7 7.7  NEUTROABS 3.6  --   --   --   HGB 11.8* 11.2* 11.8* 11.5*  HCT 40.0 33.0* 34.5* 35.8*  MCV 104.2*  --  93.2 96.0  PLT 323  --  267 372   Cardiac Enzymes: No results for input(s): CKTOTAL, CKMB, CKMBINDEX, TROPONINI in the last 168 hours. BNP: BNP (last 3 results) Recent Labs    06/01/20 0114  BNP 3,690.0*    ProBNP (last 3 results) No results for input(s): PROBNP in the last 8760 hours.  CBG: No results for input(s): GLUCAP in the last 168 hours.     Signed:  Cristal Ford  [facility] 06/03/2020, 12:48 PM

## 2020-06-03 NOTE — Progress Notes (Signed)
SATURATION QUALIFICATIONS: (This note is used to comply with regulatory documentation for home oxygen)  Patient Saturations on Room Air at Rest = 96%  Patient Saturations on Room Air while Ambulating = 96%   

## 2020-06-03 NOTE — Progress Notes (Signed)
Progress Note  Patient Name: Isaiah Patterson Date of Encounter: 06/03/2020  [facility] Cardiologist: Dr Marlou Porch  Subjective   Denies CP; episode of mild dyspnea last PM  Inpatient Medications    Scheduled Meds: . aspirin EC  81 mg Oral Daily  . atorvastatin  80 mg Oral Daily  . carvedilol  3.125 mg Oral BID WC  . clopidogrel  75 mg Oral Q breakfast  . DULoxetine  30 mg Oral Daily  . enoxaparin (LOVENOX) injection  40 mg Subcutaneous Q24H  . furosemide  20 mg Oral Daily  . gabapentin  600 mg Oral TID  . levothyroxine  75 mcg Oral Q0600  . losartan  25 mg Oral Daily  . pantoprazole  40 mg Oral Daily  . potassium chloride  20 mEq Oral BID  . sodium chloride flush  3 mL Intravenous Q12H  . sodium chloride flush  3 mL Intravenous Q12H  . spironolactone  12.5 mg Oral Daily   Continuous Infusions: . sodium chloride    . sodium chloride     PRN Meds: sodium chloride, sodium chloride, acetaminophen, morphine injection, ondansetron (ZOFRAN) IV, sodium chloride flush   Vital Signs    Vitals:   06/03/20 0028 06/03/20 0500 06/03/20 0551 06/03/20 0851  BP: 114/68  129/79 116/80  Pulse: 86  69 (!) 58  Resp: 20  15 20   Temp: 98.3 F (36.8 C)  98.4 F (36.9 C)   TempSrc: Oral  Oral   SpO2: 97%  98% 96%  Weight:  80.2 kg 79.5 kg   Height:        Intake/Output Summary (Last 24 hours) at 06/03/2020 0856 Last data filed at 06/03/2020 0600 Gross per 24 hour  Intake 723 ml  Output 300 ml  Net 423 ml   Last 3 Weights 06/03/2020 06/03/2020 06/02/2020  Weight (lbs) 175 lb 3.2 oz 176 lb 14.4 oz 176 lb 14.4 oz  Weight (kg) 79.47 kg 80.241 kg 80.241 kg      Telemetry    Sinus with short runs of PAT - Personally Reviewed   Physical Exam   GEN: WD WN NAD  Neck: supple Cardiac: RRR Respiratory: CTA GI: Soft, NT/ND  MS: No edema; radial cath site with no hematoma Neuro:  Grossly intact Psych: Normal affect   Labs    High Sensitivity Troponin:   Recent Labs  Lab  06/01/20 0535 06/01/20 0918 06/01/20 2124 06/02/20 0450 06/03/20 0004  TROPONINIHS 573* 863* 1,664* 1,424* 643*      Chemistry Recent Labs  Lab 06/01/20 0114 06/01/20 0307 06/02/20 0303 06/03/20 0004  NA 144 142 141 139  K 4.3 3.5 3.3* 3.9  CL 107  --  100 101  CO2 17*  --  29 27  GLUCOSE 162*  --  112* 108*  BUN 9  --  12 14  CREATININE 1.32*  --  1.16 1.06  CALCIUM 8.9  --  8.7* 8.7*  PROT 5.9*  --  5.6*  --   ALBUMIN 3.0*  --  2.8*  --   AST 47*  --  47*  --   ALT 31  --  37  --   ALKPHOS 151*  --  163*  --   BILITOT 1.0  --  1.0  --   GFRNONAA 56*  --  >60 >60  ANIONGAP 20*  --  12 11     Hematology Recent Labs  Lab 06/01/20 0114 06/01/20 0307 06/02/20 0303 06/03/20 0004  WBC 10.4  --  7.7 7.7  RBC 3.84*  --  3.70* 3.73*  HGB 11.8* 11.2* 11.8* 11.5*  HCT 40.0 33.0* 34.5* 35.8*  MCV 104.2*  --  93.2 96.0  MCH 30.7  --  31.9 30.8  MCHC 29.5*  --  34.2 32.1  RDW 14.4  --  14.6 14.6  PLT 323  --  267 372    BNP Recent Labs  Lab 06/01/20 0114  BNP 3,690.0*     Radiology    CARDIAC CATHETERIZATION  Result Date: 06/02/2020 Conclusions: 1. Severe native coronary artery disease, including chronic total occlusions of the ostial LAD and OM1, as well as 60% and 80% ostial/proximal ramus lesions, 80% distal LCx stenosis, and diffuse mid RCA disease of up to 60-70% with heavy calcification.  Distal LCx lesion has worsened since last catheterization in 2014; otherwise the has been no significant change. 2. Widely patent LIMA-LAD, SVG-D2, and SVG-OM1. 3. Atretic free RIMA-rPDA is unchanged since 2014. 4. Low normal left ventricular filling pressure (LVEDP ~5 mmHg). 5. Paroxysmal SVT noted during left heart catheterization, which was self-limited and lasted < 5 minutes. Recommendations: 1. 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  Severely reduced LVEF appears out of proportion to degree of coronary artery disease.  I will add  carvedilol 3.125 mg twice daily. 2. Maintain net even fluid balance. 3. Dual antiplatelet therapy with aspirin and clopidogrel for up to 12 months, as tolerated. 4. If the patient has recurrent symptoms, PCI to distal LCx could be considered, as well as functional assessment of RCA and ramus intermedius disease. 5. Aggressive secondary prevention. Nelva Bush, MD [facility]   DG CHEST PORT 1 VIEW  Result Date: 06/02/2020 CLINICAL DATA:  Shortness of breath. EXAM: PORTABLE CHEST 1 VIEW COMPARISON:  06/01/2020 FINDINGS: The heart size is enlarged. The patient is status post prior CABG. There is a right-sided Port-A-Cath is stable in well position. There is no pneumothorax. No large pleural effusion or focal infiltrate. IMPRESSION: No active disease. Electronically Signed   By: Constance Holster M.D.   On: 06/02/2020 23:32   ECHOCARDIOGRAM COMPLETE  Result Date: 06/01/2020    ECHOCARDIOGRAM REPORT   Patient Name:   Isaiah Patterson Date of Exam: 06/01/2020 Medical Rec #:  [id number]      Height:       67.0 in Accession #:    [id number]     Weight:       180.0 lb Date of Birth:  09-03-1945      BSA:          1.934 m Patient Age:    75 years       BP:           102/77 mmHg Patient Gender: M              HR:           76 bpm. Exam Location:  Inpatient Procedure: 2D Echo, Cardiac Doppler, Color Doppler and Intracardiac            Opacification Agent Indications:    R06.02 SOB  History:        Patient has no prior history of Echocardiogram examinations.                 CHF, CAD, Signs/Symptoms:Dyspnea, Shortness of Breath and Chest                 Pain; Risk Factors:Hypertension and Dyslipidemia. Cancer.  Sonographer:  Roseanna Rainbow RDCS Referring Phys: [id number] TIMOTHY S OPYD  Sonographer Comments: Technically difficult study due to poor echo windows. IMPRESSIONS  1. Left ventricular ejection fraction, by estimation, is <20%. The left ventricle has severely decreased function. The left ventricle demonstrates global  hypokinesis. There is mild left ventricular hypertrophy. Left ventricular diastolic parameters are indeterminate.  2. Right ventricular systolic function is moderately reduced. The right ventricular size is mildly enlarged. Tricuspid regurgitation signal is inadequate for assessing PA pressure.  3. Left atrial size was moderately dilated.  4. The mitral valve is normal in structure. Trivial mitral valve regurgitation.  5. The aortic valve is tricuspid. Aortic valve regurgitation is not visualized. Mild to moderate aortic valve sclerosis/calcification is present, without any evidence of aortic stenosis.  6. Aortic dilatation noted. There is mild dilatation of the ascending aorta, measuring 39 mm.  7. The inferior vena cava is dilated in size with <50% respiratory variability, suggesting right atrial pressure of 15 mmHg.  8. There is a filling defect adjacent to anteroapical wall on contrast images (best seen on images 104,105). Suspect this represents prominent LV trabeculation rather than thrombus. Would consider cardiac MRI both to rule out thrombus and evaluate for etiology of cardiomyopathy FINDINGS  Left Ventricle: Left ventricular ejection fraction, by estimation, is <20%. The left ventricle has severely decreased function. The left ventricle demonstrates global hypokinesis. Definity contrast agent was given IV to delineate the left ventricular endocardial borders. The left ventricular internal cavity size was normal in size. There is mild left ventricular hypertrophy. Left ventricular diastolic parameters are indeterminate. Right Ventricle: The right ventricular size is mildly enlarged. Right vetricular wall thickness was not well visualized. Right ventricular systolic function is moderately reduced. Tricuspid regurgitation signal is inadequate for assessing PA pressure. Left Atrium: Left atrial size was moderately dilated. Right Atrium: Right atrial size was normal in size. Pericardium: There is no evidence  of pericardial effusion. Mitral Valve: The mitral valve is normal in structure. Trivial mitral valve regurgitation. Tricuspid Valve: The tricuspid valve is normal in structure. Tricuspid valve regurgitation is trivial. Aortic Valve: The aortic valve is tricuspid. Aortic valve regurgitation is not visualized. Mild to moderate aortic valve sclerosis/calcification is present, without any evidence of aortic stenosis. Pulmonic Valve: The pulmonic valve was not well visualized. Pulmonic valve regurgitation is not visualized. Aorta: The aortic root is normal in size and structure and aortic dilatation noted. There is mild dilatation of the ascending aorta, measuring 39 mm. Venous: The inferior vena cava is dilated in size with less than 50% respiratory variability, suggesting right atrial pressure of 15 mmHg. IAS/Shunts: The interatrial septum was not well visualized.  LEFT VENTRICLE PLAX 2D LVIDd:         5.60 cm LVIDs:         5.40 cm LV PW:         1.40 cm LV IVS:        1.30 cm LVOT diam:     1.90 cm LV SV:         52 LV SV Index:   27 LVOT Area:     2.84 cm  LV Volumes (MOD) LV vol d, MOD A2C: 212.0 ml LV vol d, MOD A4C: 265.0 ml LV vol s, MOD A2C: 176.0 ml LV vol s, MOD A4C: 215.0 ml LV SV MOD A2C:     36.0 ml LV SV MOD A4C:     265.0 ml LV SV MOD BP:      48.5 ml RIGHT  VENTRICLE            IVC RV S prime:     3.92 cm/s  IVC diam: 2.70 cm TAPSE (M-mode): 1.2 cm LEFT ATRIUM              Index       RIGHT ATRIUM           Index LA diam:        4.80 cm  2.48 cm/m  RA Area:     16.90 cm LA Vol (A2C):   51.9 ml  26.84 ml/m RA Volume:   46.20 ml  23.89 ml/m LA Vol (A4C):   111.0 ml 57.40 ml/m LA Biplane Vol: 84.8 ml  43.85 ml/m  AORTIC VALVE LVOT Vmax:   121.00 cm/s LVOT Vmean:  74.400 cm/s LVOT VTI:    0.182 m  AORTA Ao Root diam: 3.45 cm Ao Asc diam:  3.90 cm MITRAL VALVE MV Area (PHT): 4.85 cm    SHUNTS MV Decel Time: 157 msec    Systemic VTI:  0.18 m MV E velocity: 90.00 cm/s  Systemic Diam: 1.90 cm Oswaldo Milian MD Electronically signed by Oswaldo Milian MD Signature Date/Time: 06/01/2020/9:43:52 PM    Final     Patient Profile     75 year old male with past medical history of coronary artery disease status post coronary artery bypass and graft, 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  Chest CT shows no pulmonary embolus but there is mention of possible bronchiolitis and follow-up chest x-ray recommended in 3 to 4 weeks to rule out malignancy.  Also bilateral pleural effusions.  Echocardiogram shows ejection fraction less than 20%.  There is mild right ventricular enlargement, moderate left atrial enlargement, mildly dilated ascending aorta.  Assessment & Plan    1 acute systolic congestive heart failure-symptoms have improved.  We will continue Lasix and spironolactone at present dose.  2 cardiomyopathy-LV dysfunction out of proportion to coronary artery disease.  Question contribution from previous chemotherapy though I do not have records concerning exact chemotherapeutic agents.  Plan to treat medically at this point.  We will continue losartan at present dose but if blood pressure allows would transition to [facility] 24/26 twice daily as an outpatient.  Continue low-dose carvedilol and titrate as an outpatient.  Given recurrent pancreatic cancer he would not be a good candidate for ICD.  3 possible non-ST elevation myocardial infarction-Cath results noted; plan medical therapy; continue ASA, plavix and statin.   4 history of pancreatic cancer status post Whipple-patient states there is possible recurrence and has been referred to radiation oncology.  5 paroxysmal atrial tachycardia-noted to have short bursts on telemetry.  Will increase carvedilol to 6.25 mg twice daily.  Patient can be discharged from a  cardiac standpoint.  Continue present medications.  Check potassium and renal function in 1 week.  We will arrange follow-up with APP in 1 week and me in 3 months.  He may also follow-up at the [place].  Please call with questions.  For questions or updates, please contact [place] Please consult [email or web address] for contact info under        Signed, Kirk Ruths, MD  06/03/2020, 8:56 AM

## 2020-06-03 NOTE — TOC Transition Note (Signed)
Transition of Care [facility]) - CM/SW Discharge Note   Patient Details  Name: ORYAN WINTERTON MRN: [id number] Date of Birth: 05-07-1945  Transition of Care [facility]) CM/SW Contact:  Zenon Mayo, RN Phone Number: 06/03/2020, 12:59 PM   Clinical Narrative:    Patient is for dc home today, NCM asked if he would like [facility] services set up , he states he does not need any HH services his fiance is his Nurse.   He has no issues with getting medications or going to MD apts.   Final next level of care: Home/Self Care Barriers to Discharge: No Barriers Identified   Patient Goals and CMS Choice Patient states their goals for this hospitalization and ongoing recovery are:: to get better   Choice offered to / list presented to : NA  Discharge Placement                       Discharge Plan and Services                  DME Agency: NA       HH Arranged: NA          Social Determinants of Health (SDOH) Interventions Food Insecurity Interventions: Intervention Not Indicated Financial Strain Interventions: Intervention Not Indicated Housing Interventions: Intervention Not Indicated Transportation Interventions: Intervention Not Indicated Alcohol Brief Interventions/Follow-up: AUDIT Score <7 follow-up not indicated   Readmission Risk Interventions No flowsheet data found.

## 2020-06-03 NOTE — Progress Notes (Signed)
Heart Failure Nurse Navigator Progress Note  PCP: Clinic, [facility] PCP-Cardiologist: Isaiah Porch, MD Admission Diagnosis: CHF, NTEMI Admitted from: home with fiance  Presentation:   Isaiah Patterson presented with [facility], BLE edema. S/P CABG, pancreatic CA s/p whipple, HTN, CHF.   ECHO/ LVEF: 06/01/20 <20%, RV mod reduced.  Clinical Course:  Past Medical History:  Diagnosis Date  . Arthritis    "hands, fingers" (02/03/2013)  . Blood dyscrasia    "told I was a free bleeder when I was young; my blood does have trouble clotting" (02/03/2013)  . Cancer ([place])   . Coronary artery disease    s/p CABG  . GERD (gastroesophageal reflux disease)   . Hyperlipidemia   . Hypertension   . Myocardial infarct ([place]) 2000  . Pneumonia    "couple times" (02/03/2013)     Social History   Socioeconomic History  . Marital status: Divorced    Spouse name: Not on file  . Number of children: Not on file  . Years of education: Not on file  . Highest education level: Not on file  Occupational History  . Not on file  Tobacco Use  . Smoking status: Former Smoker    Packs/day: 1.00    Years: 25.00    Pack years: 25.00    Types: Cigarettes  . Smokeless tobacco: Never Used  . Tobacco comment: 02/03/2013 "quit smoking ~ 25 yr ago"  Substance and Sexual Activity  . Alcohol use: Not Currently    Comment: 02/03/2013 "stopped drinking alcohol in the 1990's; never had problem w/it"  . Drug use: No  . Sexual activity: Yes  Other Topics Concern  . Not on file  Social History Narrative  . Not on file   Social Determinants of Health   Financial Resource Strain: Not on file  Food Insecurity: Not on file  Transportation Needs: Not on file  Physical Activity: Not on file  Stress: Not on file  Social Connections: Not on file   High Risk Criteria for Readmission and/or Poor Patient Outcomes:  Heart failure hospital admissions (last 6 months): 1   No Show rate: 0%  Difficult social situation:  no  Demonstrates medication adherence: yes  Primary Language: English  Literacy level: able to read/write and comprehend.  Barriers of Care:   -new medication regimen  Considerations/Referrals:   Referral made to Heart Failure Pharmacist [name]: yes, appreciated Referral made to Heart Impact TOC clinic: yes, appt scheduled Monday 2/7 @ 9AM   Items for Follow-up on DC/TOC: -medication regimen/optimization (see Isaiah Patterson's plan in note)  Isaiah Holm, RN, BSN Heart Failure Nurse Navigator Heart Impact Team [phone]

## 2020-06-03 NOTE — Progress Notes (Signed)
CARDIAC REHAB PHASE I   PRE:  Rate/Rhythm: 88 SR    BP: sitting 112/72    SaO2: 96 RA  MODE:  Ambulation: 800 ft   POST:  Rate/Rhythm: 100 ST with PVC    BP: sitting 137/86     SaO2: 97 RA  Pt up getting dressed. Assisted pt in adjusting his sling. Ambulated hall independently, no c/o except mild SOB. VSS. Discussed HF booklet with pt, he was very receptive. Understands importance of daily wts, low sodium, and watching for sx. Eager to walk daily. Not interested in CRPII.  [id number]   Cook, ACSM 06/03/2020 11:10 AM

## 2020-06-03 NOTE — Progress Notes (Signed)
   06/03/20 0800  Clinical Encounter Type  Visited With Patient  Visit Type Initial  Referral From Nurse  Consult/Referral To Chaplain  Spiritual Encounters  Spiritual Needs Literature  The chaplain spoke with the patient about the consultation for an AD. The chaplain provided AD education and explained the difference between a POA and Living Will. The chaplain left the paperwork with the patient. The chaplain will follow up as needed with the patient to complete pending notary availability.

## 2020-06-03 NOTE — Progress Notes (Signed)
Heart Failure [name] Pharmacist Progress Note   PCP: [facility] PCP-Cardiologist: No primary care provider on file.    HPI:  75 yo M with PMH of CAD s/p CABG, pancreatic adenocarcinoma s/p Whipple, HTN, HLD, and CHF.  He presented to the ED with shortness of breath and has been admitted for acute CHF. An ECHO was done on 06/01/20 and LVEF <20% (reported 55% at last ECHO with [facility]). LHC done on 06/02/20 and found to have severe native coronary artery disease. Plan for aggressive medical management unless he has recurrent symptoms, then may consider PCI to LCx.   Current HF Medications: Furosemide 20 mg daily Carvedilol 6.25 mg BID Losartan 25 mg daily Spironolactone 12.5 mg daily  Prior to admission HF Medications: Metoprolol XL 25 mg daily Losartan 50 mg daily Spironolactone 25 mg daily Isordil 30 mg daily  Pertinent Lab Values: . Serum creatinine 1.06, BUN 14, Potassium 3.9, Sodium 139, BNP 3690, Magnesium 2.3   Vital Signs: . Weight: 175 lbs (admission weight: 180 lbs) . Blood pressure: 110-120/80s  . Heart rate: 60-80s   Medication Assistance / Insurance Benefits Check: Does the patient have prescription insurance?  Yes Type of insurance plan: [place]  Outpatient Pharmacy:  Prior to admission outpatient pharmacy: [facility]   Assessment: 1. Acute on chronic systolic CHF (EF <10%), LHC pending. NYHA class II symptoms. - Continue furosemide 20 mg daily - Continue carvedilol 6.25 mg BID - Continue losartan 25 mg daily today. Consider transitioning to Entresto 24/26 mg BID. Note cardiology wants to wait until after discharge - Continue spironolactone 12.5 mg daily. Consider resuming 25 mg daily at discharge. - Consider starting Jardiance 10 mg daily Wilder Glade is not on the [place]) prior to discharge    Plan: 1) Medication changes recommended at this time: - Increase spironolactone to 25 mg daily  2) Patient assistance: - None pending - [place]  [place]ary [place] [employer] and Jardiance approved  Kerby Nora, PharmD, BCPS Heart Failure Cytogeneticist Phone [phone]

## 2020-06-03 NOTE — Discharge Instructions (Signed)
Heart Failure, Self-Care Heart failure is a serious condition. The following information explains things you need to do to take care of yourself at home. To help you stay as healthy as possible, you may be asked to change your diet, take certain medicines, and make other changes in your life. Your doctor may also give you more specific instructions. If you have problems or questions, call your doctor. What are the risks? Having heart failure makes it more likely for you to have some problems. These problems can get worse if you do not take good care of yourself. Problems may include:  Damage to the kidneys, liver, or lungs.  Malnutrition.  Abnormal heart rhythms.  Blood clotting problems that could cause a stroke. Supplies needed:  Scale for weighing yourself.  Blood pressure monitor.  Notebook.  Medicines. How to care for yourself when you have heart failure Medicines Take over-the-counter and prescription medicines only as told by your doctor. Take your medicines every day.  Do not stop taking your medicine unless your doctor tells you to do so.  Do not skip any medicines.  Get your prescriptions refilled before you run out of medicine. This is important.  Talk with your doctor if you cannot afford your medicines. Eating and drinking  Eat heart-healthy foods. Talk with a diet specialist (dietitian) to create an eating plan.  Limit salt (sodium) if told by your doctor. Ask your diet specialist to tell you which seasonings are healthy for your heart.  Cook in healthy ways instead of frying. Healthy ways of cooking include roasting, grilling, broiling, baking, poaching, steaming, and stir-frying.  Choose foods that: ? Have no trans fat. ? Are low in saturated fat and cholesterol.  Choose healthy foods, such as: ? Fresh or frozen fruits and vegetables. ? Fish. ? Low-fat (lean) meats. ? Legumes, such as beans, peas, and lentils. ? Fat-free or low-fat dairy  products. ? Whole-grain foods. ? High-fiber foods.  Limit how much fluid you drink, if told by your doctor.   Alcohol use  Do not drink alcohol if: ? Your doctor tells you not to drink. ? Your heart was damaged by alcohol, or you have very bad heart failure. ? You are pregnant, may be pregnant, or are planning to become pregnant.  If you drink alcohol: ? Limit how much you have to:  0-1 drink a day for women.  0-2 drinks a day for men. ? Know how much alcohol is in your drink. In the [place] one drink equals one 12 oz bottle of beer (355 mL), one 5 oz glass of wine (148 mL), or one 1 oz glass of hard liquor (44 mL). Lifestyle  Do not smoke or use any products that contain nicotine or tobacco. If you need help quitting, ask your doctor. ? Do not use nicotine gum or patches before talking to your doctor.  Do not use illegal drugs.  Lose weight if told by your doctor.  Do physical activity if told by your doctor. Talk to your doctor before you begin an exercise if: ? You are an older adult. ? You have very bad heart failure.  Learn to manage stress. If you need help, ask your doctor.  Get physical rehab (rehabilitation) to help you stay independent and to help with your quality of life.  Participate in a cardiac rehab program. This program helps you improve your health through exercise, education, and counseling.  Plan time to rest when you get tired.   Check weight   and blood pressure  Weigh yourself every day. This will help you to know if fluid is building up in your body. ? Weigh yourself every morning after you pee (urinate) and before you eat breakfast. ? Wear the same amount of clothing each time. ? Write down your daily weight. Give your record to your doctor.  Check and write down your blood pressure as told by your doctor.  Check your pulse as told by your doctor.   Dealing with very hot and very cold weather  If it is very hot: ? Avoid activities that take a  lot of energy. ? Use air conditioning or fans, or find a cooler place. ? Avoid caffeine and alcohol. ? Wear clothing that is loose-fitting, lightweight, and light-colored.  If it is very cold: ? Avoid activities that take a lot of energy. ? Layer your clothes. ? Wear mittens or gloves, a hat, and a face covering when you go outside. ? Avoid alcohol. Follow these instructions at home:  Stay up to date with shots (vaccines). Get pneumococcal and flu (influenza) shots.  Keep all follow-up visits. Contact a doctor if:  You gain 2-3 lb (1-1.4 kg) in 24 hours or 5 lb (2.3 kg) in a week.  You have increasing shortness of breath.  You cannot do your normal activities.  You get tired easily.  You cough a lot.  You do not feel like eating or feel like you may vomit (nauseous).  You have swelling in your hands, feet, ankles, or belly (abdomen).  You cannot sleep well because it is hard to breathe.  You feel like your heart is beating fast (palpitations).  You get dizzy when you stand up.  You feel depressed or sad. Get help right away if:  You have trouble breathing.  You or someone else notices a change in your behavior, such as having trouble staying awake.  You have chest pain or discomfort.  You pass out (faint). These symptoms may be an emergency. Get help right away. Call your local emergency services (911 in the [place]  Do not wait to see if the symptoms will go away.  Do not drive yourself to the hospital. Summary  Heart failure is a serious condition. To care for yourself, you may have to change your diet, take medicines, and make other lifestyle changes.  Take your medicines every day. Do not stop taking them unless your doctor tells you to do so.  Limit salt and eat heart-healthy foods.  Ask your doctor if you can drink alcohol. You may have to stop alcohol use if you have very bad heart failure.  Contact your doctor if you gain weight quickly or feel  that your heart is beating too fast. Get help right away if you pass out or have chest pain or trouble breathing. This information is not intended to replace advice given to you by your health care provider. Make sure you discuss any questions you have with your health care provider. Document Revised: 11/07/2019 Document Reviewed: 11/07/2019 Elsevier Patient Education  2021 [employer] Inc.  

## 2020-06-05 NOTE — Progress Notes (Incomplete)
[facility]  PCP: [facility] Primary Cardiologist:   HPI:   HPI:  Isaiah Patterson is a 75 y.o. 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    His cardiac history began in *** 2000 when he presented with MI and received CABG x5 at an outside hospital.  In our system he presented to [facility] in 2014 with chest pain but without ecg changes and overall was not felt to be having ACS.  He had a LHC:  Cardiac catheterization 02/04/13: No intervention required. Atretic Free RIMA to RCA, patent SVG to diagonal, patent SVG to ramus/OM, patent LIMA to LAD. Normal ejection fraction 55%.     ROS: All systems negative except as listed in HPI, PMH and Problem List.  SH:  Social History   Socioeconomic History  . Marital status: Divorced    Spouse name: Not on file  . Number of children: Not on file  . Years of education: Not on file  . Highest education level: Not on file  Occupational History  . Not on file  Tobacco Use  . Smoking status: Former Smoker    Packs/day: 1.00    Years: 25.00    Pack years: 25.00    Types: Cigarettes  . Smokeless tobacco: Never Used  . Tobacco comment: 02/03/2013 "quit smoking ~ 25 yr ago"  Vaping Use  . Vaping Use: Every day  Substance and Sexual Activity  . Alcohol use: Not Currently    Comment: 02/03/2013 "stopped drinking alcohol in the 1990's; never had problem w/it"  . Drug use: No  . Sexual activity: Yes  Other Topics Concern  . Not on file  Social History Narrative  . Not on file   Social Determinants of Health   Financial Resource Strain: Low Risk   . Difficulty of Paying Living Expenses: Not hard at all  Food Insecurity: No Food Insecurity  . Worried About Charity fundraiser in the Last Year: Never true   . Ran Out of Food in the Last Year: Never true  Transportation Needs: No Transportation Needs  . Lack of Transportation (Medical): No  . Lack of Transportation (Non-Medical): No  Physical Activity: Not on file  Stress: Not on file  Social Connections: Not on file  Intimate Partner Violence: Not on file    FH:  Family History  Problem Relation Age of Onset  . Heart disease Mother   . Heart disease Father     Past Medical History:  Diagnosis Date  . Arthritis    "hands, fingers" (02/03/2013)  . Blood dyscrasia    "told I was a free bleeder when I was young; my blood does have trouble clotting" (02/03/2013)  . Cancer ([place])   . Coronary artery disease    s/p CABG  . GERD (gastroesophageal reflux disease)   . Hyperlipidemia   . Hypertension   . Myocardial infarct ([place]) 2000  . Pneumonia    "couple times" (02/03/2013)    Current Outpatient Medications  Medication Sig Dispense Refill  . aspirin EC 81 MG tablet Take 1 tablet (81 mg total) by mouth daily. 30 tablet 11  . atorvastatin (LIPITOR) 80 MG tablet Take 80 mg by mouth daily.    . calcium carbonate (OS-CAL) 600 MG TABS tablet Take 600 mg by mouth daily.    Marland Kitchen  carvedilol (COREG) 6.25 MG tablet Take 1 tablet (6.25 mg total) by mouth 2 (two) times daily with a meal. 60 tablet 0  . clopidogrel (PLAVIX) 75 MG tablet Take 1 tablet (75 mg total) by mouth daily with breakfast. 30 tablet 2  . DULoxetine (CYMBALTA) 30 MG capsule Take 30 mg by mouth daily.    . famotidine (PEPCID) 20 MG tablet Take 20 mg by mouth 2 (two) times daily.    . ferrous gluconate (FERGON) 324 MG tablet Take 324 mg by mouth daily with breakfast.    . ferrous sulfate 325 (65 FE) MG tablet Take 325 mg by mouth daily with breakfast.    . furosemide (LASIX) 20 MG tablet Take 1 tablet (20 mg total) by mouth daily. 30 tablet 0  . gabapentin (NEURONTIN) 600 MG tablet Take 600 mg by mouth 3 (three) times daily.    . isosorbide dinitrate (ISORDIL) 30 MG tablet Take 30  mg by mouth daily.    Marland Kitchen levothyroxine (SYNTHROID) 75 MCG tablet Take 75 mcg by mouth daily before breakfast.    . losartan (COZAAR) 25 MG tablet Take 1 tablet (25 mg total) by mouth daily. 30 tablet 0  . Multiple Vitamin (MULTIVITAMIN WITH MINERALS) TABS tablet Take 1 tablet by mouth daily.    . nitroGLYCERIN (NITROSTAT) 0.4 MG SL tablet Place 1 tablet (0.4 mg total) under the tongue every 5 (five) minutes as needed for chest pain. 30 tablet 12  . omeprazole (PRILOSEC) 40 MG capsule Take 40 mg by mouth daily.    . potassium chloride SA (KLOR-CON) 20 MEQ tablet Take 1 tablet (20 mEq total) by mouth daily. 30 tablet 0  . spironolactone (ALDACTONE) 25 MG tablet Take 0.5 tablets (12.5 mg total) by mouth daily. 30 tablet 0  . VITAMIN D PO Take 1 tablet by mouth daily.     No current facility-administered medications for this visit.    There were no vitals filed for this visit.  PHYSICAL EXAM: ***   ECG   ASSESSMENT & PLAN:  NYHA Class     Refer to Social Work:  PCP  Medications  Transportation   ETOH   Drug Abuse Tax inspector to Pharmacy:  Refer to Development worker, community :  Refer to Home Health:   Refer to [facility]:  Refer to General Cardiology Other    Follow up

## 2020-06-06 ENCOUNTER — Ambulatory Visit
Admission: RE | Admit: 2020-06-06 | Discharge: 2020-06-06 | Disposition: A | Discharge status: Home / Self Care | Payer: Self-pay | Source: Ambulatory Visit | Attending: Radiation Oncology | Admitting: Radiation Oncology

## 2020-06-06 ENCOUNTER — Other Ambulatory Visit: Payer: Self-pay | Admitting: Radiation Oncology

## 2020-06-06 ENCOUNTER — Encounter (HOSPITAL_COMMUNITY): Payer: No Typology Code available for payment source

## 2020-06-06 DIAGNOSIS — C259 Malignant neoplasm of pancreas, unspecified: Secondary | ICD-10-CM

## 2020-06-06 NOTE — Progress Notes (Addendum)
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  Isaiah Patterson presented to the [facility] on November 2020 with nausea and generally not feeling well.  Labs revealed elevated bilirubin.  CT CAP 05/25/2020: Local retroperitoneal tumor recurrence with suspicious adjacent mesenteric lymph node.  No evidence of metastatic disease in the chest.  Biopsies of Pancreas 04/02/2019   Past/Anticipated interventions by surgeon, if any:  Whipple procedure/pylorus sparing  Past/Anticipated interventions by medical oncology, if any:  Dr. Neita Carp 05/26/2020 -Adjuvant chemo with mFOLFIRNOX and Oxaliplatin 07/23/2018 -12 cycles completed -We discussed about consideration of RT to go along with his chemo options and to be evaluated [facility] -We will work on getting NGS on his path tissue from his whipple procedure in 05/2019. -Likely systemic therapy options would be Capecitabine + RT vs Gem/Abraxane  Weight changes, if any: No  Appetite: good.  Making some changes  Bowel/Bladder complaints, if any: No  Nausea / Vomiting, if any: No  Pain issues, if any:  Mid back and left shoulder     SAFETY ISSUES:  Prior radiation? No  Pacemaker/ICD? No  Possible current pregnancy? n/a  Is the patient on methotrexate? no  Current Complaints/Details: -Port  -Has received 3 covid vaccinations, unsure when, Moderna.

## 2020-06-07 ENCOUNTER — Other Ambulatory Visit: Payer: Self-pay

## 2020-06-07 ENCOUNTER — Ambulatory Visit
Admission: RE | Admit: 2020-06-07 | Discharge: 2020-06-07 | Disposition: A | Discharge status: Home / Self Care | Payer: No Typology Code available for payment source | Source: Ambulatory Visit | Attending: Radiation Oncology | Admitting: Radiation Oncology

## 2020-06-07 ENCOUNTER — Encounter: Payer: Self-pay | Admitting: Radiation Oncology

## 2020-06-07 VITALS — BP 132/59 | HR 64 | Temp 96.9°F | Resp 18 | Ht 67.0 in | Wt 182.0 lb

## 2020-06-07 DIAGNOSIS — K219 Gastro-esophageal reflux disease without esophagitis: Secondary | ICD-10-CM | POA: Insufficient documentation

## 2020-06-07 DIAGNOSIS — Z87891 Personal history of nicotine dependence: Secondary | ICD-10-CM | POA: Diagnosis not present

## 2020-06-07 DIAGNOSIS — J90 Pleural effusion, not elsewhere classified: Secondary | ICD-10-CM | POA: Insufficient documentation

## 2020-06-07 DIAGNOSIS — M129 Arthropathy, unspecified: Secondary | ICD-10-CM | POA: Diagnosis not present

## 2020-06-07 DIAGNOSIS — I471 Supraventricular tachycardia: Secondary | ICD-10-CM | POA: Diagnosis not present

## 2020-06-07 DIAGNOSIS — Z7982 Long term (current) use of aspirin: Secondary | ICD-10-CM | POA: Insufficient documentation

## 2020-06-07 DIAGNOSIS — Z7902 Long term (current) use of antithrombotics/antiplatelets: Secondary | ICD-10-CM | POA: Insufficient documentation

## 2020-06-07 DIAGNOSIS — I2510 Atherosclerotic heart disease of native coronary artery without angina pectoris: Secondary | ICD-10-CM | POA: Insufficient documentation

## 2020-06-07 DIAGNOSIS — I252 Old myocardial infarction: Secondary | ICD-10-CM | POA: Insufficient documentation

## 2020-06-07 DIAGNOSIS — C250 Malignant neoplasm of head of pancreas: Secondary | ICD-10-CM | POA: Insufficient documentation

## 2020-06-07 DIAGNOSIS — K869 Disease of pancreas, unspecified: Secondary | ICD-10-CM

## 2020-06-07 DIAGNOSIS — Z79899 Other long term (current) drug therapy: Secondary | ICD-10-CM | POA: Insufficient documentation

## 2020-06-07 DIAGNOSIS — E785 Hyperlipidemia, unspecified: Secondary | ICD-10-CM | POA: Diagnosis not present

## 2020-06-07 DIAGNOSIS — C259 Malignant neoplasm of pancreas, unspecified: Secondary | ICD-10-CM

## 2020-06-07 DIAGNOSIS — N62 Hypertrophy of breast: Secondary | ICD-10-CM | POA: Insufficient documentation

## 2020-06-07 DIAGNOSIS — M4854XA Collapsed vertebra, not elsewhere classified, thoracic region, initial encounter for fracture: Secondary | ICD-10-CM | POA: Insufficient documentation

## 2020-06-07 DIAGNOSIS — I10 Essential (primary) hypertension: Secondary | ICD-10-CM | POA: Insufficient documentation

## 2020-06-07 MED ORDER — ACETAMINOPHEN 325 MG PO TABS
650.0000 mg | ORAL_TABLET | Freq: Once | ORAL | Status: AC
  Administered 2020-06-07: 650 mg via ORAL
  Filled 2020-06-07: qty 2

## 2020-06-07 NOTE — Progress Notes (Signed)
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   HISTORY OF PRESENT ILLNESS: Isaiah Patterson is a 75 y.o. male seen at the request of Dr. Neita Carp and Dr. 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  The patient originally presented with nausea and malaise, weight loss, and jaundice. Lab work at that time in November 2020 showed an elevated bilirubin, he underwent further diagnostic imaging that showed concerns for a mass in the head of the pancreas.  He subsequently underwent biopsy with [facility] and fine-needle aspirate on 04/02/19 revealed adenocarcinoma.  He was felt to be a good candidate for surgical resection and underwent Whipple procedure on 05/15/2019, final pathology revealed a 1.7 cm mass in the head of the pancreas consistent with an invasive adenocarcinoma, the tumor did involve the peripancreatic soft tissue and 1 of 8 lymph node sampling.  His resection margins were clear.  He began adjuvant chemotherapy between 07/23/19 and 02/04/20 but FOLFIRINOX course was dose reduced and held at times due to neuropathy. hew was able to complet 12 modified cycles. He recently was found on restaging scans on 05/25/20 to have concern for progressive disease in the adjacent mesenteric lymph node. No additional disease was seen.  He's seen today to discuss options of chemoRT with capecitabine chemosensitization versus gemzar/abraxane IV chemotherapy. Of note, the patient fell on ice a few weeks ago and was found to have a compression fracture of the T spine, and a proximal right humerus fracture.  These issues were managed conservatively but he presented to the hospital after shortness of  breath on 05/31/2020 and was admitted between 06/01/20 and 06/03/20 with concerns for acute on chronic systolic congestive heart failure/cardiomyopathy.  He was treated with Lasix and spironolactone and demand ischemia versus NSTEMI prompted catheterization and he was recommended to continue antiplatelet therapy with aspirin and Plavix.  He has follow-up with cardiology as well.     PREVIOUS RADIATION THERAPY: No   PAST MEDICAL HISTORY:  Past Medical History:  Diagnosis Date  . Arthritis    "hands, fingers" (02/03/2013)  . Blood dyscrasia    "told I was a free bleeder when I was young; my blood does have trouble clotting" (02/03/2013)  . Cancer ([employer])   . Coronary artery disease    s/p CABG  . GERD (gastroesophageal reflux disease)   . Hyperlipidemia   . Hypertension   . Myocardial infarct ([place]) 2000  . Pneumonia    "couple times" (02/03/2013)       PAST SURGICAL HISTORY: Past Surgical History:  Procedure Laterality Date  . BILIARY DILATION  04/02/2019   Procedure: BILIARY DILATION;  Surgeon: Arta Silence, MD;  Location: [facility] ENDOSCOPY;  Service: Endoscopy;;  . BILIARY STENT PLACEMENT  04/02/2019   Procedure: BILIARY STENT PLACEMENT;  Surgeon: Arta Silence, MD;  Location: [place] ENDOSCOPY;  Service: Endoscopy;;  . CORONARY ARTERY BYPASS GRAFT  2000   "CABG X5" (02/03/2013)  . ERCP N/A 04/02/2019   Procedure: ENDOSCOPIC RETROGRADE CHOLANGIOPANCREATOGRAPHY (ERCP);  Surgeon: Arta Silence, MD;  Location: [facility] ENDOSCOPY;  Service: Endoscopy;  Laterality: N/A;  . ESOPHAGOGASTRODUODENOSCOPY (EGD) WITH PROPOFOL N/A 04/02/2019   Procedure: ESOPHAGOGASTRODUODENOSCOPY (EGD)  WITH PROPOFOL;  Surgeon: Arta Silence, MD;  Location: [place];  Service: Endoscopy;  Laterality: N/A;  . EUS N/A 04/02/2019   Procedure: UPPER ENDOSCOPIC ULTRASOUND (EUS) LINEAR;  Surgeon: Arta Silence, MD;  Location: [place];  Service: Endoscopy;  Laterality: N/A;  . FINE NEEDLE ASPIRATION  04/02/2019   Procedure:  FINE NEEDLE ASPIRATION (FNA) LINEAR;  Surgeon: Arta Silence, MD;  Location: [place] ENDOSCOPY;  Service: Endoscopy;;  . LEFT HEART CATH AND CORS/GRAFTS ANGIOGRAPHY N/A 06/02/2020   Procedure: LEFT HEART CATH AND CORS/GRAFTS ANGIOGRAPHY;  Surgeon: Nelva Bush, MD;  Location: [place] CV LAB;  Service: Cardiovascular;  Laterality: N/A;  . LEFT HEART CATHETERIZATION WITH CORONARY/GRAFT ANGIOGRAM N/A 02/04/2013   Procedure: LEFT HEART CATHETERIZATION WITH Beatrix Fetters;  Surgeon: Candee Furbish, MD;  Location: [facility] CATH LAB;  Service: Cardiovascular;  Laterality: N/A;  . PANCREATIC STENT PLACEMENT  04/02/2019   Procedure: PANCREATIC STENT PLACEMENT;  Surgeon: Arta Silence, MD;  Location: [place];  Service: Endoscopy;;  . SPHINCTEROTOMY  04/02/2019   Procedure: SPHINCTEROTOMY;  Surgeon: Arta Silence, MD;  Location: [facility] ENDOSCOPY;  Service: Endoscopy;;  . TONSILLECTOMY  1950's     FAMILY HISTORY:  Family History  Problem Relation Age of Onset  . Heart disease Mother   . Heart disease Father      SOCIAL HISTORY:  reports that he has quit smoking. His smoking use included cigarettes. He has a 25.00 pack-year smoking history. He has never used smokeless tobacco. He reports previous alcohol use. He reports that he does not use drugs.  The patient is divorced but in a relationship with Gentry Roch who also listens in on speakerphone.   ALLERGIES: Shellfish allergy and Trazodone and nefazodone   MEDICATIONS:  Current Outpatient Medications  Medication Sig Dispense Refill  . aspirin EC 81 MG tablet Take 1 tablet (81 mg total) by mouth daily. 30 tablet 11  . atorvastatin (LIPITOR) 80 MG tablet Take 80 mg by mouth daily.    . calcium carbonate (OS-CAL) 600 MG TABS tablet Take 600 mg by mouth daily.    . carvedilol (COREG) 6.25 MG tablet Take 1 tablet (6.25 mg total) by mouth 2 (two) times daily with a meal. 60 tablet 0  . clopidogrel (PLAVIX) 75 MG tablet Take 1 tablet (75 mg  total) by mouth daily with breakfast. 30 tablet 2  . DULoxetine (CYMBALTA) 30 MG capsule Take 30 mg by mouth daily.    . famotidine (PEPCID) 20 MG tablet Take 20 mg by mouth 2 (two) times daily.    . ferrous gluconate (FERGON) 324 MG tablet Take 324 mg by mouth daily with breakfast.    . ferrous sulfate 325 (65 FE) MG tablet Take 325 mg by mouth daily with breakfast.    . furosemide (LASIX) 20 MG tablet Take 1 tablet (20 mg total) by mouth daily. 30 tablet 0  . gabapentin (NEURONTIN) 600 MG tablet Take 600 mg by mouth 3 (three) times daily.    . isosorbide dinitrate (ISORDIL) 30 MG tablet Take 30 mg by mouth daily.    Marland Kitchen levothyroxine (SYNTHROID) 75 MCG tablet Take 75 mcg by mouth daily before breakfast.    . losartan (COZAAR) 25 MG tablet Take 1 tablet (25 mg total) by mouth daily. 30 tablet 0  . metoprolol succinate (TOPROL-XL) 25 MG 24 hr tablet Take 0.5 tablets by mouth daily.    . Multiple Vitamin (MULTIVITAMIN WITH MINERALS) TABS tablet Take 1 tablet by mouth daily.    . naloxone (  NARCAN) nasal spray 4 mg/0.1 mL SPRAY 1 SPRAY INTO NOSTRIL ONCE FOR OPIOID OVERDOSE (TURN PERSON ON SIDE AFTER DOSE. IF NO RESPONSE IN 2-3 MINUTES OR PERSON RESPONDS BUT RELAPSES, REPEAT USING A NEW SPRAY DEVICE AND SPRAY INTO THE OTHER NOSTRIL. CALL 911 AFTER USE.) * EMERGENCY USE ONLY *    . nitroGLYCERIN (NITROSTAT) 0.4 MG SL tablet Place 1 tablet (0.4 mg total) under the tongue every 5 (five) minutes as needed for chest pain. 30 tablet 12  . omeprazole (PRILOSEC) 40 MG capsule Take 40 mg by mouth daily.    Marland Kitchen oxyCODONE-acetaminophen (PERCOCET/ROXICET) 5-325 MG tablet TAKE 1 TABLET BY MOUTH EVERY 8 HOURS AS NEEDED FOR PAIN FOR UP TO 7 DAYS    . potassium chloride SA (KLOR-CON) 20 MEQ tablet Take 1 tablet (20 mEq total) by mouth daily. 30 tablet 0  . prochlorperazine (COMPAZINE) 10 MG tablet Take 1 tablet by mouth every 8 (eight) hours.    Marland Kitchen spironolactone (ALDACTONE) 25 MG tablet Take 0.5 tablets (12.5 mg total) by  mouth daily. 30 tablet 0  . tamsulosin (FLOMAX) 0.4 MG CAPS capsule Take 1 capsule by mouth daily.    Marland Kitchen VITAMIN D PO Take 1 tablet by mouth daily.     No current facility-administered medications for this encounter.     REVIEW OF SYSTEMS: On review of systems, the patient reports that he is doing well overall. He complains of thoracic back pain as a result of his recent compression fracture, and irritation of the ribs and left shoulder.  He requests Tylenol that our nursing staff is providing to him.  He otherwise reports that he is doing quite well and is eating regularly.  He denies any bowel or bladder dysfunction unintended weight changes, nausea or vomiting.  No other complaints are verbalized.    PHYSICAL EXAM:  Wt Readings from Last 3 Encounters:  06/07/20 182 lb (82.6 kg)  06/03/20 175 lb 3.2 oz (79.5 kg)  04/02/19 200 lb 9.9 oz (91 kg)   Temp Readings from Last 3 Encounters:  06/07/20 (!) 96.9 F (36.1 C) (Temporal)  06/03/20 98.4 F (36.9 C) (Oral)  12/26/19 98.2 F (36.8 C) (Oral)   BP Readings from Last 3 Encounters:  06/07/20 (!) 132/59  06/03/20 116/80  12/27/19 128/76   Pulse Readings from Last 3 Encounters:  06/07/20 64  06/03/20 75  12/27/19 69   Pain Assessment Pain Score: 8  Pain Loc: Back (Left Shoulder)/10 In general this is a well appearing Caucasian male in no acute distress.  He's alert and oriented x4 and appropriate throughout the examination. Cardiopulmonary assessment is negative for acute distress and he exhibits normal effort.     ECOG = 1  0 - Asymptomatic (Fully active, able to carry on all predisease activities without restriction)  1 - Symptomatic but completely ambulatory (Restricted in physically strenuous activity but ambulatory and able to carry out work of a light or sedentary nature. For example, light housework, office work)  2 - Symptomatic, <50% in bed during the day (Ambulatory and capable of all self care but unable to carry  out any work activities. Up and about more than 50% of waking hours)  3 - Symptomatic, >50% in bed, but not bedbound (Capable of only limited self-care, confined to bed or chair 50% or more of waking hours)  4 - Bedbound (Completely disabled. Cannot carry on any self-care. Totally confined to bed or chair)  5 - Death   Gaspar Bidding [facility],  [place], et al. [phone]). "Toxicity and response criteria of the [facility]". [place] Oncol. 5 (6): 649-55    LABORATORY DATA:  Lab Results  Component Value Date   WBC 7.7 06/03/2020   HGB 11.5 (L) 06/03/2020   HCT 35.8 (L) 06/03/2020   MCV 96.0 06/03/2020   PLT 372 06/03/2020   Lab Results  Component Value Date   NA 139 06/03/2020   K 3.9 06/03/2020   CL 101 06/03/2020   CO2 27 06/03/2020   Lab Results  Component Value Date   ALT 37 06/02/2020   AST 47 (H) 06/02/2020   ALKPHOS 163 (H) 06/02/2020   BILITOT 1.0 06/02/2020      RADIOGRAPHY: CT Angio Chest PE W/Cm &/Or Wo Cm  Result Date: 06/01/2020 CLINICAL DATA:  Chest pain, shortness of breath. EXAM: CT ANGIOGRAPHY CHEST WITH CONTRAST TECHNIQUE: Multidetector CT imaging of the chest was performed using the standard protocol during bolus administration of intravenous contrast. Multiplanar CT image reconstructions and MIPs were obtained to evaluate the vascular anatomy. CONTRAST:  133mL OMNIPAQUE IOHEXOL 350 MG/ML SOLN COMPARISON:  Chest x-ray 06/01/2020 CT chest 04/27/2019. FINDINGS: Cardiovascular: Satisfactory opacification of the pulmonary arteries to the segmental level. No evidence of pulmonary embolism. Enlarged left heart. No significant pericardial effusion. The thoracic aorta is normal in caliber. Mild atherosclerotic plaque of the thoracic aorta. At least 3 vessel moderate to severe coronary artery calcifications. Mediastinum/Nodes: Borderline enlarged 1 cm right paratracheal lymph node. Similarly prominent right hilar lymph node. No enlarged mediastinal,  hilar, or axillary lymph nodes. Trace debris within the trachea. Otherwise the thyroid gland, trachea, and esophagus demonstrate no significant findings. Lungs/Pleura: Bilateral trace pleural effusions, left greater than right. Bilateral lower lobe subsegmental and linear atelectasis. Linear atelectasis within the lingula. Diffuse bronchial wall thickening. Centrilobular nodularity within bilateral upper lobes. Upper Abdomen: A splenule is noted.  No acute abnormality. Musculoskeletal: Marked bilateral gynecomastia. Mild subcutaneus soft tissue edema of the right chest wall and slightly asymmetric/hazy right pectoralis muscle. Acute comminuted right humeral head and neck fracture. Interval worsening of a T9 (greater than 75% height loss) and T12 (greater than 50% height loss) compression fracture. Similar-appearing anterior wedge shaped T8 (greater than 75% height loss) compression fracture. Several other vertebral bodies demonstrate vertebral height loss. Old healed bilateral rib fractures. No definite acute displaced rib fracture. Review of the MIP images confirms the above findings. IMPRESSION: 1. No pulmonary embolus. 2. Findings suggestive of bronchiolitis. Followup PA and lateral chest X-ray is recommended in 3-4 weeks following therapy to ensure resolution and exclude underlying malignancy. 3. Interval development of bilateral, left greater than right trace pleural effusions in the setting left cardiomegaly and three-vessel coronary artery calcifications. 4. Acute comminuted right humeral head and neck fracture. Associated right shoulder cutaneous soft tissue edema as well as right chest wall edema. Recommend dedicated right shoulder radiographs for further evaluation. 5. Interval worsening of T9 and T12 compression fractures. Correlate with point tenderness to palpation to evaluate for an acute component. Stable T8 compression fracture. 6. Aortic Atherosclerosis (ICD10-I70.0). Electronically Signed   By:  Iven Finn M.D.   On: 06/01/2020 04:36   CARDIAC CATHETERIZATION  Result Date: 06/02/2020 Conclusions: 1. Severe native coronary artery disease, including chronic total occlusions of the ostial LAD and OM1, as well as 60% and 80% ostial/proximal ramus lesions, 80% distal LCx stenosis, and diffuse mid RCA disease of up to 60-70% with heavy calcification.  Distal LCx lesion has worsened since last  catheterization in 2014; otherwise the has been no significant change. 2. Widely patent LIMA-LAD, SVG-D2, and SVG-OM1. 3. Atretic free RIMA-rPDA is unchanged since 2014. 4. Low normal left ventricular filling pressure (LVEDP ~5 mmHg). 5. Paroxysmal SVT noted during left heart catheterization, which was self-limited and lasted < 5 minutes. Recommendations: 1. 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  Severely reduced LVEF appears out of proportion to degree of coronary artery disease.  I will add carvedilol 3.125 mg twice daily. 2. Maintain net even fluid balance. 3. Dual antiplatelet therapy with aspirin and clopidogrel for up to 12 months, as tolerated. 4. If the patient has recurrent symptoms, PCI to distal LCx could be considered, as well as functional assessment of RCA and ramus intermedius disease. 5. Aggressive secondary prevention. Nelva Bush, MD [facility]   DG CHEST PORT 1 VIEW  Result Date: 06/02/2020 CLINICAL DATA:  Shortness of breath. EXAM: PORTABLE CHEST 1 VIEW COMPARISON:  06/01/2020 FINDINGS: The heart size is enlarged. The patient is status post prior CABG. There is a right-sided Port-A-Cath is stable in well position. There is no pneumothorax. No large pleural effusion or focal infiltrate. IMPRESSION: No active disease. Electronically Signed   By: Constance Holster M.D.   On: 06/02/2020 23:32   DG Chest Portable 1 View  Result Date: 06/01/2020 CLINICAL DATA:  Shortness of breath EXAM: PORTABLE CHEST 1 VIEW COMPARISON:  12/26/2019 FINDINGS: Cardiac  shadow is enlarged in size. Postsurgical changes are noted. Right chest wall port is seen. Mild vascular congestion is noted with interstitial edema. No focal infiltrate or sizable effusion is seen. No bony abnormality is noted. IMPRESSION: Mild changes of CHF. Electronically Signed   By: Inez Catalina M.D.   On: 06/01/2020 01:22   ECHOCARDIOGRAM COMPLETE  Result Date: 06/01/2020    ECHOCARDIOGRAM REPORT   Patient Name:   NICHOLA WARREN Gusman Date of Exam: 06/01/2020 Medical Rec #:  [id number]      Height:       67.0 in Accession #:    [id number]     Weight:       180.0 lb Date of Birth:  [birth year]      BSA:          1.934 m Patient Age:    36 years       BP:           102/77 mmHg Patient Gender: M              HR:           76 bpm. Exam Location:  Inpatient Procedure: 2D Echo, Cardiac Doppler, Color Doppler and Intracardiac            Opacification Agent Indications:    R06.02 SOB  History:        Patient has no prior history of Echocardiogram examinations.                 CHF, CAD, Signs/Symptoms:Dyspnea, Shortness of Breath and Chest                 Pain; Risk Factors:Hypertension and Dyslipidemia. Cancer.  Sonographer:    Roseanna Rainbow RDCS Referring Phys: [id number] TIMOTHY S OPYD  Sonographer Comments: Technically difficult study due to poor echo windows. IMPRESSIONS  1. Left ventricular ejection fraction, by estimation, is <20%. The left ventricle has severely decreased function. The left ventricle demonstrates global hypokinesis. There is mild left ventricular hypertrophy. Left ventricular diastolic parameters are indeterminate.  2. Right  ventricular systolic function is moderately reduced. The right ventricular size is mildly enlarged. Tricuspid regurgitation signal is inadequate for assessing PA pressure.  3. Left atrial size was moderately dilated.  4. The mitral valve is normal in structure. Trivial mitral valve regurgitation.  5. The aortic valve is tricuspid. Aortic valve regurgitation is not visualized. Mild to  moderate aortic valve sclerosis/calcification is present, without any evidence of aortic stenosis.  6. Aortic dilatation noted. There is mild dilatation of the ascending aorta, measuring 39 mm.  7. The inferior vena cava is dilated in size with <50% respiratory variability, suggesting right atrial pressure of 15 mmHg.  8. There is a filling defect adjacent to anteroapical wall on contrast images (best seen on images 104,105). Suspect this represents prominent LV trabeculation rather than thrombus. Would consider cardiac MRI both to rule out thrombus and evaluate for etiology of cardiomyopathy FINDINGS  Left Ventricle: Left ventricular ejection fraction, by estimation, is <20%. The left ventricle has severely decreased function. The left ventricle demonstrates global hypokinesis. Definity contrast agent was given IV to delineate the left ventricular endocardial borders. The left ventricular internal cavity size was normal in size. There is mild left ventricular hypertrophy. Left ventricular diastolic parameters are indeterminate. Right Ventricle: The right ventricular size is mildly enlarged. Right vetricular wall thickness was not well visualized. Right ventricular systolic function is moderately reduced. Tricuspid regurgitation signal is inadequate for assessing PA pressure. Left Atrium: Left atrial size was moderately dilated. Right Atrium: Right atrial size was normal in size. Pericardium: There is no evidence of pericardial effusion. Mitral Valve: The mitral valve is normal in structure. Trivial mitral valve regurgitation. Tricuspid Valve: The tricuspid valve is normal in structure. Tricuspid valve regurgitation is trivial. Aortic Valve: The aortic valve is tricuspid. Aortic valve regurgitation is not visualized. Mild to moderate aortic valve sclerosis/calcification is present, without any evidence of aortic stenosis. Pulmonic Valve: The pulmonic valve was not well visualized. Pulmonic valve regurgitation is not  visualized. Aorta: The aortic root is normal in size and structure and aortic dilatation noted. There is mild dilatation of the ascending aorta, measuring 39 mm. Venous: The inferior vena cava is dilated in size with less than 50% respiratory variability, suggesting right atrial pressure of 15 mmHg. IAS/Shunts: The interatrial septum was not well visualized.  LEFT VENTRICLE PLAX 2D LVIDd:         5.60 cm LVIDs:         5.40 cm LV PW:         1.40 cm LV IVS:        1.30 cm LVOT diam:     1.90 cm LV SV:         52 LV SV Index:   27 LVOT Area:     2.84 cm  LV Volumes (MOD) LV vol d, MOD A2C: 212.0 ml LV vol d, MOD A4C: 265.0 ml LV vol s, MOD A2C: 176.0 ml LV vol s, MOD A4C: 215.0 ml LV SV MOD A2C:     36.0 ml LV SV MOD A4C:     265.0 ml LV SV MOD BP:      48.5 ml RIGHT VENTRICLE            IVC RV S prime:     3.92 cm/s  IVC diam: 2.70 cm TAPSE (M-mode): 1.2 cm LEFT ATRIUM              Index       RIGHT ATRIUM  Index LA diam:        4.80 cm  2.48 cm/m  RA Area:     16.90 cm LA Vol (A2C):   51.9 ml  26.84 ml/m RA Volume:   46.20 ml  23.89 ml/m LA Vol (A4C):   111.0 ml 57.40 ml/m LA Biplane Vol: 84.8 ml  43.85 ml/m  AORTIC VALVE LVOT Vmax:   121.00 cm/s LVOT Vmean:  74.400 cm/s LVOT VTI:    0.182 m  AORTA Ao Root diam: 3.45 cm Ao Asc diam:  3.90 cm MITRAL VALVE MV Area (PHT): 4.85 cm    SHUNTS MV Decel Time: 157 msec    Systemic VTI:  0.18 m MV E velocity: 90.00 cm/s  Systemic Diam: 1.90 cm Oswaldo Milian MD Electronically signed by Oswaldo Milian MD Signature Date/Time: 06/01/2020/9:43:52 PM    Final        IMPRESSION/PLAN: 1. 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 Dr. Lisbeth Renshaw discusses thepatient's previous pathology findings and reviews the nature of pancreas cancer and the patient's course to date. With the local recurrence seen on CT imaging, Dr. Lisbeth Renshaw discusses the rationale to consider either chemoRT versus additional IV chemotherapy.  We discussed the risks, benefits, short, and [place] term effects of radiotherapy, as well as the curative intent, and the patient is interested in proceeding. Dr. Lisbeth Renshaw discusses the delivery and logistics of radiotherapy and anticipates a course of 5 weeks of radiotherapy to the abdominal nodes. He will simulate on 06/15/20, and begin treatment with radiotherapy and Xeloda on 06/20/20 provided that the [facility] oncology team is in agreement. We will reach back out to Dr. Neita Carp and his colleagues to coordinate. 2. Pain secondary to recent orthopedic fractures. The patient will follow up with orthopedics and manage symptoms conservatively until then.  3. Cardiac disease. The patient will follow up with cardiology and present to the ED if he had acute onset of symptoms given his history and recent admission.  In a visit lasting 60 minutes, greater than 50% of the time was spent face to face discussing the patient's condition, in preparation for the discussion, and coordinating the patient's care.   The above documentation reflects my direct findings during this shared patient visit. Please see the separate note by Dr. Lisbeth Renshaw on this date for the remainder of the patient's plan of care.    Carola Rhine, PAC

## 2020-06-09 ENCOUNTER — Encounter: Payer: Self-pay | Admitting: Medical

## 2020-06-09 ENCOUNTER — Ambulatory Visit (INDEPENDENT_AMBULATORY_CARE_PROVIDER_SITE_OTHER): Payer: No Typology Code available for payment source | Admitting: Medical

## 2020-06-09 ENCOUNTER — Other Ambulatory Visit: Payer: Self-pay

## 2020-06-09 VITALS — BP 88/72 | HR 66 | Ht 67.0 in | Wt 178.0 lb

## 2020-06-09 DIAGNOSIS — I5042 Chronic combined systolic (congestive) and diastolic (congestive) heart failure: Secondary | ICD-10-CM | POA: Diagnosis not present

## 2020-06-09 DIAGNOSIS — I2510 Atherosclerotic heart disease of native coronary artery without angina pectoris: Secondary | ICD-10-CM

## 2020-06-09 DIAGNOSIS — Z951 Presence of aortocoronary bypass graft: Secondary | ICD-10-CM

## 2020-06-09 DIAGNOSIS — I10 Essential (primary) hypertension: Secondary | ICD-10-CM

## 2020-06-09 DIAGNOSIS — E785 Hyperlipidemia, unspecified: Secondary | ICD-10-CM

## 2020-06-09 DIAGNOSIS — R55 Syncope and collapse: Secondary | ICD-10-CM | POA: Diagnosis not present

## 2020-06-09 MED ORDER — PANTOPRAZOLE SODIUM 40 MG PO TBEC: 40.0000 mg | DELAYED_RELEASE_TABLET | Freq: Every day | ORAL | 3 refills | Status: DC

## 2020-06-09 MED ORDER — ISOSORBIDE DINITRATE 30 MG PO TABS: 15.0000 mg | ORAL_TABLET | Freq: Every day | ORAL | Status: DC

## 2020-06-09 MED ORDER — CARVEDILOL 6.25 MG PO TABS: 3.1250 mg | ORAL_TABLET | Freq: Two times a day (BID) | ORAL | 0 refills | Status: DC

## 2020-06-09 MED ORDER — SPIRONOLACTONE 25 MG PO TABS: 12.5000 mg | ORAL_TABLET | ORAL | 0 refills | Status: DC

## 2020-06-09 NOTE — Progress Notes (Signed)
Cardiology Office Note   Date:  06/12/2020   ID:  Isaiah, Patterson 1945-06-19, MRN [id number]  PCP:  [facility]  Cardiologist:  Kirk Ruths, MD EP: None  Chief Complaint  Patient presents with  . Hospitalization Follow-up    CHF      History of Present Illness: Isaiah Patterson is a 75 y.o. 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   He was recently admitted from 06/01/20-06/03/20 for acute combined CHF and elevated HsTrop after presenting with DOE and LE edema. Echo that admission showed EF <20%, mild LVH, indeterminate LV diastolic function, moderately reduced RV systolic function, moderate LAE, no significant valvular abnormalities, and mildly dilated ascending aorta (30mm). He underwent a LHC to r/o ischemic etiology of his cardiomyopathy given elevated HsTrop (peat at 1600s) which showed severe native CAD with patent LIMA-LAD, SVG-D2, and SVG-OM1 with atretic free RIMA-rPDA unchanged from 2014. He was recommended for medical therapy with aspirin and plavix for up to 12 months if tolerated, started on carvedilol 3.125mg  BID, and recommended for possible PCI to dLCx +/- RCA if recurrent symptoms. He was discharged home on carvedilol 3.125mg  BID and spironolactone, with consideration to start entresto outpatient if BP allows. He was felt to be a poor candidate for ICD given recurrent pancreatic cancer.   He presents today for post-hospital follow-up. Prior to my evaluation patient reported lightheadedness, diaphoresis, and nausea. He was pale. Patient was placed in a supine position with improvement in symptoms. SBP at that time was in the 70s with HR in the 40s. He denies issues with dizziness, lightheadedness, or  syncope prior to today. He took his medications ~1 hour prior to this visit though is not sure of which medications he has been taking after I brought up a couple discrepancies between hospital recommendations and his current medication list (namely isordil is listed but was not used during his hospitalization, and both metoprolol succinate and carvedilol are listed). I attempted to sit him upright during my exam which resulted in quick return of his lightheadedness. Orthostatic vital signs obtained with BP 95/61 and HR 46 while supine, BP 100/66 and HR 56 while sitting, BP 88/40 and HR 52 at 0 min standing, and BP 80/50 and HR 54 bpm at 2 minutes standing with recurrent lightheadedness which improved with sitting down.   Since discharge he has no complaints of chest pain, SOB, DOE, palpitations, LE edema, orthopnea, or PND.   He was monitored in the office and given po hydration with improvement in symptoms. Repeat orthostatics improved. Isaiah Beets, RN and Isaiah Patterson, CMA went above and beyond to help get the patient home as he did not have a ride.     Past Medical History:  Diagnosis Date  . Arthritis    "hands, fingers" (02/03/2013)  . Blood dyscrasia    "told I was a free bleeder when I was young; my blood does have trouble clotting" (02/03/2013)  . Cancer ([place])   . Coronary artery disease    s/p CABG  . GERD (gastroesophageal reflux disease)   . Hyperlipidemia   . Hypertension   . Myocardial infarct ([place]) 2000  . Pneumonia    "couple times" (02/03/2013)    Past Surgical History:  Procedure Laterality Date  .  BILIARY DILATION  04/02/2019   Procedure: BILIARY DILATION;  Surgeon: Isaiah Silence, MD;  Location: [facility] ENDOSCOPY;  Service: Endoscopy;;  . BILIARY STENT PLACEMENT  04/02/2019   Procedure: BILIARY STENT PLACEMENT;  Surgeon: Isaiah Silence, MD;  Location: [place] ENDOSCOPY;  Service: Endoscopy;;  . CORONARY ARTERY BYPASS GRAFT  2000   "CABG X5" (02/03/2013)  . ERCP N/A 04/02/2019    Procedure: ENDOSCOPIC RETROGRADE CHOLANGIOPANCREATOGRAPHY (ERCP);  Surgeon: Isaiah Silence, MD;  Location: [facility] ENDOSCOPY;  Service: Endoscopy;  Laterality: N/A;  . ESOPHAGOGASTRODUODENOSCOPY (EGD) WITH PROPOFOL N/A 04/02/2019   Procedure: ESOPHAGOGASTRODUODENOSCOPY (EGD) WITH PROPOFOL;  Surgeon: Isaiah Silence, MD;  Location: [place];  Service: Endoscopy;  Laterality: N/A;  . EUS N/A 04/02/2019   Procedure: UPPER ENDOSCOPIC ULTRASOUND (EUS) LINEAR;  Surgeon: Isaiah Silence, MD;  Location: [place];  Service: Endoscopy;  Laterality: N/A;  . FINE NEEDLE ASPIRATION  04/02/2019   Procedure: FINE NEEDLE ASPIRATION (FNA) LINEAR;  Surgeon: Isaiah Silence, MD;  Location: [place] ENDOSCOPY;  Service: Endoscopy;;  . LEFT HEART CATH AND CORS/GRAFTS ANGIOGRAPHY N/A 06/02/2020   Procedure: LEFT HEART CATH AND CORS/GRAFTS ANGIOGRAPHY;  Surgeon: Isaiah Bush, MD;  Location: [place] CV LAB;  Service: Cardiovascular;  Laterality: N/A;  . LEFT HEART CATHETERIZATION WITH CORONARY/GRAFT ANGIOGRAM N/A 02/04/2013   Procedure: LEFT HEART CATHETERIZATION WITH Isaiah Patterson;  Surgeon: Candee Furbish, MD;  Location: [facility] CATH LAB;  Service: Cardiovascular;  Laterality: N/A;  . PANCREATIC STENT PLACEMENT  04/02/2019   Procedure: PANCREATIC STENT PLACEMENT;  Surgeon: Isaiah Silence, MD;  Location: [facility] ENDOSCOPY;  Service: Endoscopy;;  . SPHINCTEROTOMY  04/02/2019   Procedure: SPHINCTEROTOMY;  Surgeon: Isaiah Silence, MD;  Location: [place] ENDOSCOPY;  Service: Endoscopy;;  . TONSILLECTOMY  1950's     Current Outpatient Medications  Medication Sig Dispense Refill  . aspirin EC 81 MG tablet Take 1 tablet (81 mg total) by mouth daily. 30 tablet 11  . atorvastatin (LIPITOR) 80 MG tablet Take 80 mg by mouth daily.    . calcium carbonate (OS-CAL) 600 MG TABS tablet Take 600 mg by mouth daily.    . clopidogrel (PLAVIX) 75 MG tablet Take 1 tablet (75 mg total) by mouth daily with breakfast. 30 tablet 2  .  DULoxetine (CYMBALTA) 30 MG capsule Take 30 mg by mouth daily.    . ferrous sulfate 325 (65 FE) MG tablet Take 325 mg by mouth daily with breakfast.    . furosemide (LASIX) 20 MG tablet Take 1 tablet (20 mg total) by mouth daily. 30 tablet 0  . levothyroxine (SYNTHROID) 75 MCG tablet Take 75 mcg by mouth daily before breakfast.    . losartan (COZAAR) 25 MG tablet Take 1 tablet (25 mg total) by mouth daily. 30 tablet 0  . Multiple Vitamin (MULTIVITAMIN WITH MINERALS) TABS tablet Take 1 tablet by mouth daily.    . naloxone (NARCAN) nasal spray 4 mg/0.1 mL SPRAY 1 SPRAY INTO NOSTRIL ONCE FOR OPIOID OVERDOSE (TURN PERSON ON SIDE AFTER DOSE. IF NO RESPONSE IN 2-3 MINUTES OR PERSON RESPONDS BUT RELAPSES, REPEAT USING A NEW SPRAY DEVICE AND SPRAY INTO THE OTHER NOSTRIL. CALL 911 AFTER USE.) * EMERGENCY USE ONLY *    . nitroGLYCERIN (NITROSTAT) 0.4 MG SL tablet Place 1 tablet (0.4 mg total) under the tongue every 5 (five) minutes as needed for chest pain. 30 tablet 12  . oxyCODONE-acetaminophen (PERCOCET/ROXICET) 5-325 MG tablet TAKE 1 TABLET BY MOUTH EVERY 8 HOURS AS NEEDED FOR PAIN FOR UP TO 7 DAYS    .  pantoprazole (PROTONIX) 40 MG tablet Take 1 tablet (40 mg total) by mouth daily. 90 tablet 3  . potassium chloride SA (KLOR-CON) 20 MEQ tablet Take 1 tablet (20 mEq total) by mouth daily. 30 tablet 0  . prochlorperazine (COMPAZINE) 10 MG tablet Take 1 tablet by mouth every 8 (eight) hours.    . carvedilol (COREG) 6.25 MG tablet Take 0.5 tablets (3.125 mg total) by mouth 2 (two) times daily with a meal. 60 tablet 0  . isosorbide dinitrate (ISORDIL) 30 MG tablet Take 0.5 tablets (15 mg total) by mouth daily.    Marland Kitchen spironolactone (ALDACTONE) 25 MG tablet Take 0.5 tablets (12.5 mg total) by mouth every other day. 30 tablet 0   No current facility-administered medications for this visit.    Allergies:   Shellfish allergy and Trazodone and nefazodone    Social History:  The patient  reports that he has quit  smoking. His smoking use included cigarettes. He has a 25.00 pack-year smoking history. He has never used smokeless tobacco. He reports previous alcohol use. He reports that he does not use drugs.   Family History:  The patient's family history includes Heart disease in his father and mother.    ROS:  Please see the history of present illness.   Otherwise, review of systems are positive for none.   All other systems are reviewed and negative.    PHYSICAL EXAM: VS:  BP (!) 88/72   Pulse 66   Ht 5\' 7"  (1.702 m)   Wt 178 lb (80.7 kg)   SpO2 94%   BMI 27.88 kg/m  , BMI Body mass index is 27.88 kg/m. GEN: Well nourished, well developed, in no acute distress HEENT: sclera anicteric Neck: no JVD, carotid bruits, or masses Cardiac: RRR; no murmurs, rubs, or gallops, no edema  Respiratory:  clear to auscultation bilaterally, normal work of breathing GI: soft, nontender, nondistended, + BS MS: no deformity or atrophy Skin: warm and dry, no rash Neuro:  Strength and sensation are intact Psych: euthymic mood, full affect   EKG:  EKG is ordered today. The ekg ordered today demonstrates sinus rhythm with rate 66 bpm, lateral TWI, no STE/D. No change from previous   Recent Labs: 06/01/2020: B Natriuretic Peptide 3,690.0 06/02/2020: ALT 37 06/03/2020: Hemoglobin 11.5; Magnesium 2.3; Platelets 372 06/09/2020: BUN 16; Creatinine, Ser 1.06; Potassium 5.6; Sodium 138    Lipid Panel    Component Value Date/Time   CHOL 157 02/04/2013 0635   TRIG 151 (H) 02/04/2013 0635   HDL 53 02/04/2013 0635   CHOLHDL 3.0 02/04/2013 0635   VLDL 30 02/04/2013 0635   LDLCALC 74 02/04/2013 0635      Wt Readings from Last 3 Encounters:  06/09/20 178 lb (80.7 kg)  06/07/20 182 lb (82.6 kg)  06/03/20 175 lb 3.2 oz (79.5 kg)      Other studies Reviewed: Additional studies/ records that were reviewed today include:   [place] 06/02/20: Conclusions: 1. Severe native coronary artery disease, including chronic  total occlusions of the ostial LAD and OM1, as well as 60% and 80% ostial/proximal ramus lesions, 80% distal LCx stenosis, and diffuse mid RCA disease of up to 60-70% with heavy calcification.  Distal LCx lesion has worsened since last catheterization in 2014; otherwise the has been no significant change. 2. Widely patent LIMA-LAD, SVG-D2, and SVG-OM1. 3. Atretic free RIMA-rPDA is unchanged since 2014. 4. Low normal left ventricular filling pressure (LVEDP ~5 mmHg). 5. Paroxysmal SVT noted during left heart catheterization,  which was self-limited and lasted < 5 minutes.  Recommendations: 1. 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  Severely reduced LVEF appears out of proportion to degree of coronary artery disease.  I will add carvedilol 3.125 mg twice daily. 2. Maintain net even fluid balance. 3. Dual antiplatelet therapy with aspirin and clopidogrel for up to 12 months, as tolerated. 4. If the patient has recurrent symptoms, PCI to distal LCx could be considered, as well as functional assessment of RCA and ramus intermedius disease. 5. Aggressive secondary prevention.    Echocardiogram 06/01/20: 1. Left ventricular ejection fraction, by estimation, is <20%. The left  ventricle has severely decreased function. The left ventricle demonstrates  global hypokinesis. There is mild left ventricular hypertrophy. Left  ventricular diastolic parameters are  indeterminate.  2. Right ventricular systolic function is moderately reduced. The right  ventricular size is mildly enlarged. Tricuspid regurgitation signal is  inadequate for assessing PA pressure.  3. Left atrial size was moderately dilated.  4. The mitral valve is normal in structure. Trivial mitral valve  regurgitation.  5. The aortic valve is tricuspid. Aortic valve regurgitation is not  visualized. Mild to moderate aortic valve sclerosis/calcification is  present, without any evidence of aortic  stenosis.  6. Aortic dilatation noted. There is mild dilatation of the ascending  aorta, measuring 39 mm.  7. The inferior vena cava is dilated in size with <50% respiratory  variability, suggesting right atrial pressure of 15 mmHg.  8. There is a filling defect adjacent to anteroapical wall on contrast  images (best seen on images 104,105). Suspect this represents prominent LV  trabeculation rather than thrombus. Would consider cardiac MRI both to  rule out thrombus and evaluate for  etiology of cardiomyopathy     ASSESSMENT AND PLAN:   1. Pre-syncope: occurred in the office today ~1 hour after taking morning medications. I am concerned about over medications. Recently started on carvedilol, spironolactone, and lasix during his hospital stay, in addition to his losartan. Appears he was on isordil and metoprolol succinate prior to admission which was continued based on discharge medications. Case discussed with Dr. Claiborne Billings (DOD). Patient was monitored in the office and given po hydration given EF <20%. Symptoms improved and repeat orthostatics improved. Patient instructed to hold off on PM medications and resume tomorrow with the following recommendations: - Will stop metoprolol succinate and reduce carvedilol to 3.125mg  BID - Will reduce spironolactone to 12.5mg  QOD - Will reduce isordil to 15mg  daily - Will transition to prn lasix 20mg  daily for weight gain of 3lbs overnight or 5lbs in 1 week.  - Will tentatively continue losartan for now  2. Chronic combined CHF: Weight 81.0kg today, up from 79.5kg on the day of discharge.  - Will transition to lasix 20mg  daily as needed for weight gain of 3lbs overnight or 5lbs in 1 week.  - Continue carvedilol, spironolactone, losartan, and isordil as above - Will repeat a BMET today for close monitoring if kidney function/electrolytes  3. CAD s/p remote CABG: stable LHC during recent admission, now on aspirin and plavix. No anginal complaints since  discharge - Continue aspirin and plavix - Continue atorvastatin  4. HTN: BP 88/72 on arrival - Managed in the context of #1  5. HLD: no recent lipids on file - Continue atorvastatin  6. GERD: on omeprazole, though there is a potential interaction with plavix - Will transition to pantoprazole for acid reduction   7. Pancreatic cancer s/p Whipple with recent recurrence:  follows at the [facility] for [facility]ology [facility] - Continue management per his oncologist/RadOnc   Current medicines are reviewed at length with the patient today.  The patient does not have concerns regarding medicines.  The following changes have been made:  As above  Labs/ tests ordered today include:   Orders Placed This Encounter  Procedures  . Basic Metabolic Panel (BMET)  . EKG 12-Lead     Disposition:   FU with me 06/20/20   Signed, Abigail Butts, PA-C  06/12/2020 10:38 AM

## 2020-06-09 NOTE — Patient Instructions (Addendum)
Medication Instructions:   STOP METOPROLOL  DECREASE CARVEDILOL TO 3.125 MG TWICE DAILY=1/2 OF THE 6.25 MG TABLETS TWICE DAILY  DECREASE SPIRONOLACTONE TO 1/2 TABLET EVERY OTHER DAY  DECREASE ISOSORBIDE TO 15 MG ONCE DAILY= 1/2 OF THE 30 MG TABLET ONCE DAILY  CHANGE FUROSEMIDE TO 20 MG ONCE DAILY AS NEEDED FOR SWELLING OR WEIGHT GAIN OF 3 LBS OVERNIGHT OR 5 LBS IN ONE WEEK  STOP OMEPRAZOLE  START PANTOPRAZOLE 40 MG ONCE DAILY   *If you need a refill on your cardiac medications before your next appointment, please call your pharmacy*   Follow-Up: At [facility], you and your health needs are our priority.  As part of our continuing mission to provide you with exceptional heart care, we have created designated Provider Care Teams.  These Care Teams include your primary Cardiologist (physician) and Advanced Practice Providers (APPs -  Physician Assistants and Nurse Practitioners) who all work together to provide you with the care you need, when you need it.  We recommend signing up for the patient portal called "MyChart".  Sign up information is provided on this After Visit Summary.  MyChart is used to connect with patients for Virtual Visits (Telemedicine).  Patients are able to view lab/test results, encounter notes, upcoming appointments, etc.  Non-urgent messages can be sent to your provider as well.   To learn more about what you can do with MyChart, go to [email or web address].    Your next appointment:    06/20/20 @ 8:45 AM WITH KRISTA KROEGER PA-C

## 2020-06-10 LAB — BASIC METABOLIC PANEL
BUN/Creatinine Ratio: 15 (ref 10–24)
BUN: 16 mg/dL (ref 8–27)
CO2: 22 mmol/L (ref 20–29)
Calcium: 9.6 mg/dL (ref 8.6–10.2)
Chloride: 100 mmol/L (ref 96–106)
Creatinine, Ser: 1.06 mg/dL (ref 0.76–1.27)
GFR calc Af Amer: 79 mL/min/{1.73_m2} (ref >59)
GFR calc non Af Amer: 68 mL/min/{1.73_m2} (ref >59)
Glucose: 104 mg/dL — ABNORMAL HIGH (ref 65–99)
Potassium: 5.6 mmol/L — ABNORMAL HIGH (ref 3.5–5.2)
Sodium: 138 mmol/L (ref 134–144)

## 2020-06-12 ENCOUNTER — Encounter: Payer: Self-pay | Admitting: Medical

## 2020-06-13 ENCOUNTER — Other Ambulatory Visit: Payer: Self-pay

## 2020-06-13 DIAGNOSIS — I5042 Chronic combined systolic (congestive) and diastolic (congestive) heart failure: Secondary | ICD-10-CM

## 2020-06-15 ENCOUNTER — Ambulatory Visit
Admission: RE | Admit: 2020-06-15 | Discharge: 2020-06-15 | Disposition: A | Discharge status: Home / Self Care | Payer: No Typology Code available for payment source | Source: Ambulatory Visit | Attending: Radiation Oncology | Admitting: Radiation Oncology

## 2020-06-15 ENCOUNTER — Other Ambulatory Visit: Payer: Self-pay

## 2020-06-15 VITALS — BP 136/78 | HR 56 | Temp 98.3°F | Resp 16

## 2020-06-15 DIAGNOSIS — C250 Malignant neoplasm of head of pancreas: Secondary | ICD-10-CM | POA: Diagnosis not present

## 2020-06-15 DIAGNOSIS — D136 Benign neoplasm of pancreas: Secondary | ICD-10-CM

## 2020-06-15 DIAGNOSIS — Z510 Encounter for antineoplastic radiation therapy: Secondary | ICD-10-CM | POA: Insufficient documentation

## 2020-06-15 DIAGNOSIS — C786 Secondary malignant neoplasm of retroperitoneum and peritoneum: Secondary | ICD-10-CM | POA: Insufficient documentation

## 2020-06-15 DIAGNOSIS — C259 Malignant neoplasm of pancreas, unspecified: Secondary | ICD-10-CM

## 2020-06-15 MED ORDER — SODIUM CHLORIDE 0.9% FLUSH
10.0000 mL | Freq: Once | INTRAVENOUS | Status: AC
  Administered 2020-06-15: 10 mL via INTRAVENOUS

## 2020-06-15 NOTE — Progress Notes (Signed)
Has armband been applied?  Yes  Does patient have an allergy to IV contrast dye?: no   Has patient ever received premedication for IV contrast dye?: no  Does patient take metformin?: No  If patient does take metformin when was the last dose: n/a  Date of lab work: 06/09/2020 BUN: 16 CR: 1.06 EGfr: 68   IV site: Left AC  Has IV site been added to flowsheet?  Yes  BP 136/78   Pulse (!) 56   Temp 98.3 F (36.8 C)   Resp 16   SpO2 97%    Treasure Ingrum M. Leonie Green, BSN

## 2020-06-19 NOTE — Progress Notes (Signed)
Cardiology Office Note   Date:  06/20/2020   ID:  Tynell, Winchell [birth year], MRN [id number]  PCP:  [facility]  Cardiologist:  Kirk Ruths, MD EP: None  Chief Complaint  Patient presents with  . Follow-up    Recent pre-syncope      History of Present Illness: Isaiah Patterson is a 75 y.o. male with a PMH of CAD s/p remote CABG, recently diagnosed chronic combined CHF, HTN, HLD, and pancreatic cancer s/p whipple with recent evidence of recurrence, and recent fall resulting in medically managed thoracic compression fracture and right humerus fracture, who presents for close follow-up after recent pre-syncope at his office visit 06/09/20.   He was recently admitted from 06/01/20-06/03/20 for acute combined CHF and elevated HsTrop after presenting with DOE and LE edema. Echo that admission showed EF <20%, mild LVH, indeterminate LV diastolic function, moderately reduced RV systolic function, moderate LAE, no significant valvular abnormalities, and mildly dilated ascending aorta (56mm). He underwent a LHC to r/o ischemic etiology of his cardiomyopathy given elevated HsTrop (peat at 1600s) which showed severe native CAD with patent LIMA-LAD, SVG-D2, and SVG-OM1 with atretic free RIMA-rPDA unchanged from 2014. He was recommended for medical therapy with aspirin and plavix for up to 12 months if tolerated, started on carvedilol 3.125mg  BID, and recommended for possible PCI to dLCx +/- RCA if recurrent symptoms. He was discharged home on carvedilol 3.125mg  BID and spironolactone, with consideration to start entresto outpatient if BP allows. He was felt to be a poor candidate for ICD given recurrent pancreatic cancer.   He was seen for post-hospital follow-up 06/09/20, at which time he experienced lightheadedness, diaphoresis, and nausea in the office. Patient was placed in a supine position with improvement in symptoms. SBP at that time was in the 70s with HR in the 40s. He denies  issues with dizziness, lightheadedness, or syncope prior to today. He took his medications ~1 hour prior to this visit though is not sure of which medications he has been taking after I brought up a couple discrepancies between hospital recommendations and his current medication list (namely isordil is listed but was not used during his hospitalization, and both metoprolol succinate and carvedilol are listed). I attempted to sit him upright during my exam which resulted in quick return of his lightheadedness. Orthostatic vital signs obtained with BP 95/61 and HR 46 while supine, BP 100/66 and HR 56 while sitting, BP 88/40 and HR 52 at 0 min standing, and BP 80/50 and HR 54 bpm at 2 minutes standing with recurrent lightheadedness which improved with sitting down. He was monitored in the office and given po hydration with improvement in symptoms. Repeat orthostatics improved. Several medication changes occurred, including stopping metoprolol, reducing carvedilol, spironolactone, and isordil, and transitioning to prn lasix.   He presents today for close follow-up. He reports doing well over the past 1.5 weeks. He has not had any recurrent dizziness/lightheadedness. He had a brief episode of LUQ abdominal discomfort after eating which lasted 10-15 minutes before resolving spontaneously. He was worried it may be related to his heart, though had no associated SOB, dizziness, lightheadedness, or palpitations. Suspect indigestion/gas rather than cardiac etiology. He has no complaints of chest pian or SOB. He reports taking lasix daily and no issues with LE edema, orthopnea, or PND. He reports ongoing back pain and states they are considering a kyphoplasty in the future pending MRI results. He is able to complete 4 METs without anginal complaints.  Past Medical History:  Diagnosis Date  . Arthritis    "hands, fingers" (02/03/2013)  . Blood dyscrasia    "told I was a free bleeder when I was young; my blood does  have trouble clotting" (02/03/2013)  . Cancer ([place])   . Coronary artery disease    s/p CABG  . GERD (gastroesophageal reflux disease)   . Hyperlipidemia   . Hypertension   . Myocardial infarct ([place]) 2000  . Pneumonia    "couple times" (02/03/2013)    Past Surgical History:  Procedure Laterality Date  . BILIARY DILATION  04/02/2019   Procedure: BILIARY DILATION;  Surgeon: Arta Silence, MD;  Location: [facility] ENDOSCOPY;  Service: Endoscopy;;  . BILIARY STENT PLACEMENT  04/02/2019   Procedure: BILIARY STENT PLACEMENT;  Surgeon: Arta Silence, MD;  Location: [facility] ENDOSCOPY;  Service: Endoscopy;;  . CORONARY ARTERY BYPASS GRAFT  2000   "CABG X5" (02/03/2013)  . ERCP N/A 04/02/2019   Procedure: ENDOSCOPIC RETROGRADE CHOLANGIOPANCREATOGRAPHY (ERCP);  Surgeon: Arta Silence, MD;  Location: [facility] ENDOSCOPY;  Service: Endoscopy;  Laterality: N/A;  . ESOPHAGOGASTRODUODENOSCOPY (EGD) WITH PROPOFOL N/A 04/02/2019   Procedure: ESOPHAGOGASTRODUODENOSCOPY (EGD) WITH PROPOFOL;  Surgeon: Arta Silence, MD;  Location: [place];  Service: Endoscopy;  Laterality: N/A;  . EUS N/A 04/02/2019   Procedure: UPPER ENDOSCOPIC ULTRASOUND (EUS) LINEAR;  Surgeon: Arta Silence, MD;  Location: [place];  Service: Endoscopy;  Laterality: N/A;  . FINE NEEDLE ASPIRATION  04/02/2019   Procedure: FINE NEEDLE ASPIRATION (FNA) LINEAR;  Surgeon: Arta Silence, MD;  Location: [place] ENDOSCOPY;  Service: Endoscopy;;  . LEFT HEART CATH AND CORS/GRAFTS ANGIOGRAPHY N/A 06/02/2020   Procedure: LEFT HEART CATH AND CORS/GRAFTS ANGIOGRAPHY;  Surgeon: Nelva Bush, MD;  Location: [place] CV LAB;  Service: Cardiovascular;  Laterality: N/A;  . LEFT HEART CATHETERIZATION WITH CORONARY/GRAFT ANGIOGRAM N/A 02/04/2013   Procedure: LEFT HEART CATHETERIZATION WITH Beatrix Fetters;  Surgeon: Candee Furbish, MD;  Location: [facility] CATH LAB;  Service: Cardiovascular;  Laterality: N/A;  . PANCREATIC STENT PLACEMENT  04/02/2019   Procedure:  PANCREATIC STENT PLACEMENT;  Surgeon: Arta Silence, MD;  Location: [facility] ENDOSCOPY;  Service: Endoscopy;;  . SPHINCTEROTOMY  04/02/2019   Procedure: SPHINCTEROTOMY;  Surgeon: Arta Silence, MD;  Location: [place] ENDOSCOPY;  Service: Endoscopy;;  . TONSILLECTOMY  1950's     Current Outpatient Medications  Medication Sig Dispense Refill  . aspirin EC 81 MG tablet Take 1 tablet (81 mg total) by mouth daily. 30 tablet 11  . calcium carbonate (OS-CAL) 600 MG TABS tablet Take 600 mg by mouth daily.    . DULoxetine (CYMBALTA) 30 MG capsule Take 30 mg by mouth daily.    . ferrous sulfate 325 (65 FE) MG tablet Take 325 mg by mouth daily with breakfast.    . levothyroxine (SYNTHROID) 75 MCG tablet Take 75 mcg by mouth daily before breakfast.    . Multiple Vitamin (MULTIVITAMIN WITH MINERALS) TABS tablet Take 1 tablet by mouth daily.    Marland Kitchen oxyCODONE-acetaminophen (PERCOCET/ROXICET) 5-325 MG tablet TAKE 1 TABLET BY MOUTH EVERY 8 HOURS AS NEEDED FOR PAIN FOR UP TO 7 DAYS    . spironolactone (ALDACTONE) 25 MG tablet Take 0.5 tablets (12.5 mg total) by mouth every other day. 30 tablet 0  . atorvastatin (LIPITOR) 80 MG tablet Take 1 tablet (80 mg total) by mouth daily. 90 tablet 3  . carvedilol (COREG) 6.25 MG tablet Take 0.5 tablets (3.125 mg total) by mouth 2 (two) times daily with a meal. 90 tablet 3  .  clopidogrel (PLAVIX) 75 MG tablet Take 1 tablet (75 mg total) by mouth daily with breakfast. 90 tablet 3  . furosemide (LASIX) 20 MG tablet Take 1 tablet (20 mg total) by mouth daily. 90 tablet 3  . isosorbide dinitrate (ISORDIL) 30 MG tablet Take 0.5 tablets (15 mg total) by mouth daily. 50 tablet 3  . losartan (COZAAR) 25 MG tablet Take 1 tablet (25 mg total) by mouth daily. 90 tablet 3  . naloxone (NARCAN) nasal spray 4 mg/0.1 mL SPRAY 1 SPRAY INTO NOSTRIL ONCE FOR OPIOID OVERDOSE (TURN PERSON ON SIDE AFTER DOSE. IF NO RESPONSE IN 2-3 MINUTES OR PERSON RESPONDS BUT RELAPSES, REPEAT USING A NEW SPRAY DEVICE  AND SPRAY INTO THE OTHER NOSTRIL. CALL 911 AFTER USE.) * EMERGENCY USE ONLY *    . nitroGLYCERIN (NITROSTAT) 0.4 MG SL tablet Place 1 tablet (0.4 mg total) under the tongue every 5 (five) minutes as needed for chest pain. 30 tablet 5  . pantoprazole (PROTONIX) 40 MG tablet Take 1 tablet (40 mg total) by mouth daily. 90 tablet 3  . prochlorperazine (COMPAZINE) 10 MG tablet Take 1 tablet by mouth every 8 (eight) hours.     No current facility-administered medications for this visit.    Allergies:   Shellfish allergy and Trazodone and nefazodone    Social History:  The patient  reports that he has quit smoking. His smoking use included cigarettes. He has a 25.00 pack-year smoking history. He has never used smokeless tobacco. He reports previous alcohol use. He reports that he does not use drugs.   Family History:  The patient's family history includes Heart disease in his father and mother.    ROS:  Please see the history of present illness.   Otherwise, review of systems are positive for none.   All other systems are reviewed and negative.    PHYSICAL EXAM: VS:  BP 128/72 (BP Location: Left Arm, Patient Position: Sitting)   Pulse 69   Ht 5\' 7"  (1.702 m)   Wt 181 lb (82.1 kg)   SpO2 96%   BMI 28.35 kg/m  , BMI Body mass index is 28.35 kg/m. GEN: Well nourished, well developed, in no acute distress HEENT: sclera anicteric Neck: no JVD, carotid bruits, or masses Cardiac: RRR; no murmurs, rubs, or gallops, no edema  Respiratory:  clear to auscultation bilaterally, normal work of breathing GI: soft, nontender, nondistended, + BS MS: no deformity or atrophy Skin: warm and dry, no rash Neuro:  Strength and sensation are intact Psych: euthymic mood, full affect   EKG:  EKG is not ordered today.   Recent Labs: 06/01/2020: B Natriuretic Peptide 3,690.0 06/02/2020: ALT 37 06/03/2020: Hemoglobin 11.5; Magnesium 2.3; Platelets 372 06/09/2020: BUN 16; Creatinine, Ser 1.06; Potassium 5.6;  Sodium 138    Lipid Panel    Component Value Date/Time   CHOL 157 02/04/2013 0635   TRIG 151 (H) 02/04/2013 0635   HDL 53 02/04/2013 0635   CHOLHDL 3.0 02/04/2013 0635   VLDL 30 02/04/2013 0635   LDLCALC 74 02/04/2013 0635      Wt Readings from Last 3 Encounters:  06/20/20 181 lb (82.1 kg)  06/09/20 178 lb (80.7 kg)  06/07/20 182 lb (82.6 kg)      Other studies Reviewed: Additional studies/ records that were reviewed today include:   [place] 06/02/20: Conclusions: 1. Severe native coronary artery disease, including chronic total occlusions of the ostial LAD and OM1, as well as 60% and 80% ostial/proximal ramus lesions, 80%  distal LCx stenosis, and diffuse mid RCA disease of up to 60-70% with heavy calcification. Distal LCx lesion has worsened since last catheterization in 2014; otherwise the has been no significant change. 2. Widely patent LIMA-LAD, SVG-D2, and SVG-OM1. 3. Atretic free RIMA-rPDA is unchanged since 2014. 4. Low normal left ventricular filling pressure (LVEDP ~5 mmHg). 5. Paroxysmal SVT noted during left heart catheterization, which was self-limited and lasted < 5 minutes.  Recommendations: 1. 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 Severely reduced LVEF appears out of proportion to degree of coronary artery disease. I will add carvedilol 3.125 mg twice daily. 2. Maintain net even fluid balance. 3. Dual antiplatelet therapy with aspirin and clopidogrel for up to 12 months, as tolerated. 4. If the patient has recurrent symptoms, PCI to distal LCx could be considered, as well as functional assessment of RCA and ramus intermedius disease. 5. Aggressive secondary prevention.    Echocardiogram 06/01/20: 1. Left ventricular ejection fraction, by estimation, is <20%. The left  ventricle has severely decreased function. The left ventricle demonstrates  global hypokinesis. There is mild left ventricular hypertrophy. Left   ventricular diastolic parameters are  indeterminate.  2. Right ventricular systolic function is moderately reduced. The right  ventricular size is mildly enlarged. Tricuspid regurgitation signal is  inadequate for assessing PA pressure.  3. Left atrial size was moderately dilated.  4. The mitral valve is normal in structure. Trivial mitral valve  regurgitation.  5. The aortic valve is tricuspid. Aortic valve regurgitation is not  visualized. Mild to moderate aortic valve sclerosis/calcification is  present, without any evidence of aortic stenosis.  6. Aortic dilatation noted. There is mild dilatation of the ascending  aorta, measuring 39 mm.  7. The inferior vena cava is dilated in size with <50% respiratory  variability, suggesting right atrial pressure of 15 mmHg.  8. There is a filling defect adjacent to anteroapical wall on contrast  images (best seen on images 104,105). Suspect this represents prominent LV  trabeculation rather than thrombus. Would consider cardiac MRI both to  rule out thrombus and evaluate for  etiology of cardiomyopathy     ASSESSMENT AND PLAN:   1. Pre-syncope: No recurrent symptoms since medication changes 06/09/20.  - Continue carvedilol to 3.125mg  BID, spironolactone to 12.5mg  QOD, isordil to 15mg  daily, losartan 25mg  daily, and lasix 20mg  daily  2. Chronic combined CHF: Weight 181lbs today, stable from 178lbs at visit 06/09/20. He has continued to take lasix daily since his last visit and reports stable weights at home.  - Continue carvedilol, spironolactone, lasix, losartan, and isordil as above - Will repeat a BMET today for close monitoring if kidney function/electrolytes given elevated potassium at last visit.  - Will plan to repeat an echocardiogram 08/2020 to evaluate for improvement in LV function. Consider referral to EP if EF remains <35%.   3. CAD s/p remote CABG: stable LHC during recent admission, now on aspirin and plavix. No  anginal complaints since discharge - Continue aspirin and plavix - Continue atorvastatin  4. HTN: BP 128/72 today which is typically where his blood pressure has been at home.  - Managed in the context of #1  5. HLD: no recent lipids on file - Continue atorvastatin  6. GERD: on omeprazole, though there is a potential interaction with plavix - Will transition to pantoprazole for acid reduction   7. Pancreatic cancer s/p Whipple with recent recurrence: anticipating starting XRT soon - underwent simulation 06/15/20.  - Continue management per his  oncologist/RadOnc  8. Preop assessment: patient reports he is being considered for kyphoplasty in the coming months due to persistent back pain after a fall. He is awaiting an MRI. He can complete 4 METs without anginal complaints, though remains at higher risk in light of his recent cardiomyopathy diagnosis.  - No further cardiac work-up is needed prior to surgery - Will route to Dr. Stanford Breed for input on holding aspirin and plavix in the event kyphoplasty is pursued.     Current medicines are reviewed at length with the patient today.  The patient does not have concerns regarding medicines.  The following changes have been made:  As above  Labs/ tests ordered today include:   Orders Placed This Encounter  Procedures  . ECHOCARDIOGRAM COMPLETE     Disposition:   FU with Dr. Stanford Breed in 3 months following echocardiogram  Signed, Abigail Butts, PA-C  06/20/2020 9:35 AM

## 2020-06-20 ENCOUNTER — Other Ambulatory Visit: Payer: Self-pay

## 2020-06-20 ENCOUNTER — Encounter: Payer: Self-pay | Admitting: Medical

## 2020-06-20 ENCOUNTER — Ambulatory Visit (INDEPENDENT_AMBULATORY_CARE_PROVIDER_SITE_OTHER): Payer: No Typology Code available for payment source | Admitting: Medical

## 2020-06-20 VITALS — BP 128/72 | HR 69 | Ht 67.0 in | Wt 181.0 lb

## 2020-06-20 DIAGNOSIS — I2510 Atherosclerotic heart disease of native coronary artery without angina pectoris: Secondary | ICD-10-CM | POA: Diagnosis not present

## 2020-06-20 DIAGNOSIS — Z951 Presence of aortocoronary bypass graft: Secondary | ICD-10-CM | POA: Diagnosis not present

## 2020-06-20 DIAGNOSIS — I10 Essential (primary) hypertension: Secondary | ICD-10-CM

## 2020-06-20 DIAGNOSIS — I5042 Chronic combined systolic (congestive) and diastolic (congestive) heart failure: Secondary | ICD-10-CM

## 2020-06-20 DIAGNOSIS — E785 Hyperlipidemia, unspecified: Secondary | ICD-10-CM

## 2020-06-20 DIAGNOSIS — R55 Syncope and collapse: Secondary | ICD-10-CM

## 2020-06-20 LAB — BASIC METABOLIC PANEL
BUN/Creatinine Ratio: 12 (ref 10–24)
BUN: 13 mg/dL (ref 8–27)
CO2: 25 mmol/L (ref 20–29)
Calcium: 9.2 mg/dL (ref 8.6–10.2)
Chloride: 105 mmol/L (ref 96–106)
Creatinine, Ser: 1.07 mg/dL (ref 0.76–1.27)
GFR calc Af Amer: 78 mL/min/{1.73_m2} (ref >59)
GFR calc non Af Amer: 68 mL/min/{1.73_m2} (ref >59)
Glucose: 99 mg/dL (ref 65–99)
Potassium: 4.4 mmol/L (ref 3.5–5.2)
Sodium: 142 mmol/L (ref 134–144)

## 2020-06-20 MED ORDER — PANTOPRAZOLE SODIUM 40 MG PO TBEC: 40.0000 mg | DELAYED_RELEASE_TABLET | Freq: Every day | ORAL | 3 refills | Status: DC

## 2020-06-20 MED ORDER — CARVEDILOL 6.25 MG PO TABS: 3.1250 mg | ORAL_TABLET | Freq: Two times a day (BID) | ORAL | 0 refills | Status: DC

## 2020-06-20 MED ORDER — NITROGLYCERIN 0.4 MG SL SUBL: 0.4000 mg | SUBLINGUAL_TABLET | SUBLINGUAL | 5 refills | Status: DC | PRN

## 2020-06-20 MED ORDER — FUROSEMIDE 20 MG PO TABS: 20.0000 mg | ORAL_TABLET | Freq: Every day | ORAL | 3 refills | Status: DC

## 2020-06-20 MED ORDER — ISOSORBIDE DINITRATE 30 MG PO TABS: 15.0000 mg | ORAL_TABLET | Freq: Every day | ORAL | 3 refills | Status: DC

## 2020-06-20 MED ORDER — FUROSEMIDE 20 MG PO TABS: 20.0000 mg | ORAL_TABLET | Freq: Every day | ORAL | 0 refills | Status: DC

## 2020-06-20 MED ORDER — CARVEDILOL 6.25 MG PO TABS: 3.1250 mg | ORAL_TABLET | Freq: Two times a day (BID) | ORAL | 3 refills | Status: DC

## 2020-06-20 MED ORDER — ATORVASTATIN CALCIUM 80 MG PO TABS: 80.0000 mg | ORAL_TABLET | Freq: Every day | ORAL | 3 refills | Status: DC

## 2020-06-20 MED ORDER — NITROGLYCERIN 0.4 MG SL SUBL: 0.4000 mg | SUBLINGUAL_TABLET | SUBLINGUAL | 2 refills | Status: DC | PRN

## 2020-06-20 MED ORDER — LOSARTAN POTASSIUM 25 MG PO TABS: 25.0000 mg | ORAL_TABLET | Freq: Every day | ORAL | 3 refills | Status: DC

## 2020-06-20 MED ORDER — LOSARTAN POTASSIUM 25 MG PO TABS: 25.0000 mg | ORAL_TABLET | Freq: Every day | ORAL | 0 refills | Status: DC

## 2020-06-20 MED ORDER — CLOPIDOGREL BISULFATE 75 MG PO TABS: 75.0000 mg | ORAL_TABLET | Freq: Every day | ORAL | 3 refills | Status: DC

## 2020-06-20 MED ORDER — CLOPIDOGREL BISULFATE 75 MG PO TABS: 75.0000 mg | ORAL_TABLET | Freq: Every day | ORAL | 0 refills | Status: DC

## 2020-06-20 NOTE — Patient Instructions (Signed)
Medication Instructions:  Your physician recommends that you continue on your current medications as directed. Please refer to the Current Medication list given to you today.  *If you need a refill on your cardiac medications before your next appointment, please call your pharmacy*   Lab Work: Your physician recommends that have labs drawn today: BMET   If you have labs (blood work) drawn today and your tests are completely normal, you will receive your results only by: Marland Kitchen MyChart Message (if you have MyChart) OR . A paper copy in the mail If you have any lab test that is abnormal or we need to change your treatment, we will call you to review the results.   Testing/Procedures: Your physician has requested that you have an echocardiogram. Echocardiography is a painless test that uses sound waves to create images of your heart. It provides your doctor with information about the size and shape of your heart and how well your heart's chambers and valves are working. This procedure takes approximately one hour. There are no restrictions for this procedure. This procedure is done at [address]    Follow-Up: At [facility], you and your health needs are our priority.  As part of our continuing mission to provide you with exceptional heart care, we have created designated Provider Care Teams.  These Care Teams include your primary Cardiologist (physician) and Advanced Practice Providers (APPs -  Physician Assistants and Nurse Practitioners) who all work together to provide you with the care you need, when you need it.  We recommend signing up for the patient portal called "MyChart".  Sign up information is provided on this After Visit Summary.  MyChart is used to connect with patients for Virtual Visits (Telemedicine).  Patients are able to view lab/test results, encounter notes, upcoming appointments, etc.  Non-urgent messages can be sent to your provider as well.   To learn  more about what you can do with MyChart, go to [email or web address].    Your next appointment:   3 month(s)  The format for your next appointment:   In Person  Provider:   Kirk Ruths, MD

## 2020-06-21 DIAGNOSIS — Z510 Encounter for antineoplastic radiation therapy: Secondary | ICD-10-CM | POA: Diagnosis not present

## 2020-06-22 ENCOUNTER — Other Ambulatory Visit: Payer: Self-pay

## 2020-06-22 ENCOUNTER — Ambulatory Visit
Admission: RE | Admit: 2020-06-22 | Discharge: 2020-06-22 | Disposition: A | Discharge status: Home / Self Care | Payer: No Typology Code available for payment source | Source: Ambulatory Visit | Attending: Radiation Oncology | Admitting: Radiation Oncology

## 2020-06-22 DIAGNOSIS — Z510 Encounter for antineoplastic radiation therapy: Secondary | ICD-10-CM | POA: Diagnosis not present

## 2020-06-23 ENCOUNTER — Ambulatory Visit
Admission: RE | Admit: 2020-06-23 | Discharge: 2020-06-23 | Disposition: A | Discharge status: Home / Self Care | Payer: No Typology Code available for payment source | Source: Ambulatory Visit | Attending: Radiation Oncology | Admitting: Radiation Oncology

## 2020-06-23 ENCOUNTER — Other Ambulatory Visit: Payer: Self-pay

## 2020-06-23 DIAGNOSIS — Z510 Encounter for antineoplastic radiation therapy: Secondary | ICD-10-CM | POA: Diagnosis not present

## 2020-06-23 NOTE — Progress Notes (Signed)
Pt here for patient teaching.  Pt given Radiation and You booklet, skin care instructions and Sonafine.  Reviewed areas of pertinence such as diarrhea, fatigue, hair loss, nausea and vomiting, skin changes and urinary and bladder changes . Pt able to give teach back of to pat skin, use unscented/gentle soap, use baby wipes, have Imodium on hand, drink plenty of water and sitz bath,apply Sonafine bid and avoid applying anything to skin within 4 hours of treatment. Pt verbalizes understanding of information given and will contact nursing with any questions or concerns.

## 2020-06-24 ENCOUNTER — Ambulatory Visit
Admission: RE | Admit: 2020-06-24 | Discharge: 2020-06-24 | Disposition: A | Discharge status: Home / Self Care | Payer: No Typology Code available for payment source | Source: Ambulatory Visit | Attending: Radiation Oncology | Admitting: Radiation Oncology

## 2020-06-24 ENCOUNTER — Other Ambulatory Visit: Payer: Self-pay

## 2020-06-24 DIAGNOSIS — Z510 Encounter for antineoplastic radiation therapy: Secondary | ICD-10-CM | POA: Diagnosis not present

## 2020-06-24 DIAGNOSIS — C259 Malignant neoplasm of pancreas, unspecified: Secondary | ICD-10-CM

## 2020-06-24 MED ORDER — SONAFINE EX EMUL: 1.0000 "application " | Freq: Two times a day (BID) | CUTANEOUS | Status: DC

## 2020-06-27 ENCOUNTER — Ambulatory Visit
Admission: RE | Admit: 2020-06-27 | Discharge: 2020-06-27 | Disposition: A | Discharge status: Home / Self Care | Payer: No Typology Code available for payment source | Source: Ambulatory Visit | Attending: Radiation Oncology | Admitting: Radiation Oncology

## 2020-06-27 ENCOUNTER — Other Ambulatory Visit: Payer: Self-pay

## 2020-06-27 DIAGNOSIS — Z510 Encounter for antineoplastic radiation therapy: Secondary | ICD-10-CM | POA: Diagnosis not present

## 2020-06-28 ENCOUNTER — Ambulatory Visit
Admission: RE | Admit: 2020-06-28 | Discharge: 2020-06-28 | Disposition: A | Discharge status: Home / Self Care | Payer: No Typology Code available for payment source | Source: Ambulatory Visit | Attending: Radiation Oncology | Admitting: Radiation Oncology

## 2020-06-28 ENCOUNTER — Other Ambulatory Visit: Payer: Self-pay

## 2020-06-28 DIAGNOSIS — C786 Secondary malignant neoplasm of retroperitoneum and peritoneum: Secondary | ICD-10-CM | POA: Insufficient documentation

## 2020-06-28 DIAGNOSIS — C250 Malignant neoplasm of head of pancreas: Secondary | ICD-10-CM | POA: Insufficient documentation

## 2020-06-28 DIAGNOSIS — Z510 Encounter for antineoplastic radiation therapy: Secondary | ICD-10-CM | POA: Insufficient documentation

## 2020-06-29 ENCOUNTER — Ambulatory Visit
Admission: RE | Admit: 2020-06-29 | Discharge: 2020-06-29 | Disposition: A | Discharge status: Home / Self Care | Payer: No Typology Code available for payment source | Source: Ambulatory Visit | Attending: Radiation Oncology | Admitting: Radiation Oncology

## 2020-06-29 ENCOUNTER — Other Ambulatory Visit: Payer: Self-pay

## 2020-06-29 DIAGNOSIS — Z510 Encounter for antineoplastic radiation therapy: Secondary | ICD-10-CM | POA: Diagnosis not present

## 2020-06-30 ENCOUNTER — Ambulatory Visit
Admission: RE | Admit: 2020-06-30 | Discharge: 2020-06-30 | Disposition: A | Discharge status: Home / Self Care | Payer: No Typology Code available for payment source | Source: Ambulatory Visit | Attending: Radiation Oncology | Admitting: Radiation Oncology

## 2020-06-30 ENCOUNTER — Other Ambulatory Visit: Payer: Self-pay

## 2020-06-30 ENCOUNTER — Telehealth: Payer: Self-pay | Admitting: Cardiology

## 2020-06-30 DIAGNOSIS — Z510 Encounter for antineoplastic radiation therapy: Secondary | ICD-10-CM | POA: Diagnosis not present

## 2020-06-30 NOTE — Telephone Encounter (Signed)
*  STAT* If patient is at the pharmacy, call can be transferred to refill team.   1. Which medications need to be refilled? (please list name of each medication and dose if known)  carvedilol (COREG) 6.25 MG tablet clopidogrel (PLAVIX) 75 MG tablet furosemide (LASIX) 20 MG tablet  2. Which pharmacy/location (including street and city if local pharmacy) is medication to be sent to? [place], [place] - [address]  3. Do they need a 30 day or 90 day supply? 90 day supply  Patient states his pcp, Dr. Felton Clinton, with the [facility] is requesting an authorization from [place], [place] prior to distributing the medication. He states the authorization can be faxed to [phone].

## 2020-07-01 ENCOUNTER — Other Ambulatory Visit: Payer: Self-pay

## 2020-07-01 ENCOUNTER — Ambulatory Visit
Admission: RE | Admit: 2020-07-01 | Discharge: 2020-07-01 | Disposition: A | Discharge status: Home / Self Care | Payer: No Typology Code available for payment source | Source: Ambulatory Visit | Attending: Radiation Oncology | Admitting: Radiation Oncology

## 2020-07-01 DIAGNOSIS — Z510 Encounter for antineoplastic radiation therapy: Secondary | ICD-10-CM | POA: Diagnosis not present

## 2020-07-04 ENCOUNTER — Ambulatory Visit
Admission: RE | Admit: 2020-07-04 | Discharge: 2020-07-04 | Disposition: A | Discharge status: Home / Self Care | Payer: No Typology Code available for payment source | Source: Ambulatory Visit | Attending: Radiation Oncology | Admitting: Radiation Oncology

## 2020-07-04 ENCOUNTER — Other Ambulatory Visit: Payer: Self-pay

## 2020-07-04 DIAGNOSIS — Z510 Encounter for antineoplastic radiation therapy: Secondary | ICD-10-CM | POA: Diagnosis not present

## 2020-07-05 ENCOUNTER — Ambulatory Visit
Admission: RE | Admit: 2020-07-05 | Discharge: 2020-07-05 | Disposition: A | Discharge status: Home / Self Care | Payer: No Typology Code available for payment source | Source: Ambulatory Visit | Attending: Radiation Oncology | Admitting: Radiation Oncology

## 2020-07-05 ENCOUNTER — Other Ambulatory Visit: Payer: Self-pay

## 2020-07-05 DIAGNOSIS — Z510 Encounter for antineoplastic radiation therapy: Secondary | ICD-10-CM | POA: Diagnosis not present

## 2020-07-06 ENCOUNTER — Other Ambulatory Visit: Payer: Self-pay

## 2020-07-06 ENCOUNTER — Ambulatory Visit
Admission: RE | Admit: 2020-07-06 | Discharge: 2020-07-06 | Disposition: A | Discharge status: Home / Self Care | Payer: No Typology Code available for payment source | Source: Ambulatory Visit | Attending: Radiation Oncology | Admitting: Radiation Oncology

## 2020-07-06 DIAGNOSIS — Z510 Encounter for antineoplastic radiation therapy: Secondary | ICD-10-CM | POA: Diagnosis not present

## 2020-07-07 ENCOUNTER — Ambulatory Visit
Admission: RE | Admit: 2020-07-07 | Discharge: 2020-07-07 | Disposition: A | Discharge status: Home / Self Care | Payer: No Typology Code available for payment source | Source: Ambulatory Visit | Attending: Radiation Oncology | Admitting: Radiation Oncology

## 2020-07-07 DIAGNOSIS — Z510 Encounter for antineoplastic radiation therapy: Secondary | ICD-10-CM | POA: Diagnosis not present

## 2020-07-08 ENCOUNTER — Ambulatory Visit
Admission: RE | Admit: 2020-07-08 | Discharge: 2020-07-08 | Disposition: A | Discharge status: Home / Self Care | Payer: No Typology Code available for payment source | Source: Ambulatory Visit | Attending: Radiation Oncology | Admitting: Radiation Oncology

## 2020-07-08 DIAGNOSIS — Z510 Encounter for antineoplastic radiation therapy: Secondary | ICD-10-CM | POA: Diagnosis not present

## 2020-07-11 ENCOUNTER — Ambulatory Visit
Admission: RE | Admit: 2020-07-11 | Discharge: 2020-07-11 | Disposition: A | Discharge status: Home / Self Care | Payer: No Typology Code available for payment source | Source: Ambulatory Visit | Attending: Radiation Oncology | Admitting: Radiation Oncology

## 2020-07-11 ENCOUNTER — Other Ambulatory Visit: Payer: Self-pay

## 2020-07-11 DIAGNOSIS — Z510 Encounter for antineoplastic radiation therapy: Secondary | ICD-10-CM | POA: Diagnosis not present

## 2020-07-12 ENCOUNTER — Ambulatory Visit
Admission: RE | Admit: 2020-07-12 | Discharge: 2020-07-12 | Disposition: A | Discharge status: Home / Self Care | Payer: No Typology Code available for payment source | Source: Ambulatory Visit | Attending: Radiation Oncology | Admitting: Radiation Oncology

## 2020-07-12 DIAGNOSIS — Z510 Encounter for antineoplastic radiation therapy: Secondary | ICD-10-CM | POA: Diagnosis not present

## 2020-07-13 ENCOUNTER — Other Ambulatory Visit: Payer: Self-pay

## 2020-07-13 ENCOUNTER — Ambulatory Visit
Admission: RE | Admit: 2020-07-13 | Discharge: 2020-07-13 | Disposition: A | Discharge status: Home / Self Care | Payer: No Typology Code available for payment source | Source: Ambulatory Visit | Attending: Radiation Oncology | Admitting: Radiation Oncology

## 2020-07-13 DIAGNOSIS — Z510 Encounter for antineoplastic radiation therapy: Secondary | ICD-10-CM | POA: Diagnosis not present

## 2020-07-14 ENCOUNTER — Ambulatory Visit
Admission: RE | Admit: 2020-07-14 | Discharge: 2020-07-14 | Disposition: A | Discharge status: Home / Self Care | Payer: No Typology Code available for payment source | Source: Ambulatory Visit | Attending: Radiation Oncology | Admitting: Radiation Oncology

## 2020-07-14 DIAGNOSIS — Z510 Encounter for antineoplastic radiation therapy: Secondary | ICD-10-CM | POA: Diagnosis not present

## 2020-07-15 ENCOUNTER — Other Ambulatory Visit: Payer: Self-pay | Admitting: Radiation Oncology

## 2020-07-15 ENCOUNTER — Ambulatory Visit
Admission: RE | Admit: 2020-07-15 | Discharge: 2020-07-15 | Disposition: A | Discharge status: Home / Self Care | Payer: No Typology Code available for payment source | Source: Ambulatory Visit | Attending: Radiation Oncology | Admitting: Radiation Oncology

## 2020-07-15 DIAGNOSIS — Z510 Encounter for antineoplastic radiation therapy: Secondary | ICD-10-CM | POA: Diagnosis not present

## 2020-07-15 MED ORDER — ONDANSETRON HCL 8 MG PO TABS: 8.0000 mg | ORAL_TABLET | Freq: Three times a day (TID) | ORAL | 1 refills | Status: DC | PRN

## 2020-07-18 ENCOUNTER — Ambulatory Visit
Admission: RE | Admit: 2020-07-18 | Discharge: 2020-07-18 | Disposition: A | Discharge status: Home / Self Care | Payer: No Typology Code available for payment source | Source: Ambulatory Visit | Attending: Radiation Oncology | Admitting: Radiation Oncology

## 2020-07-18 ENCOUNTER — Other Ambulatory Visit: Payer: Self-pay

## 2020-07-18 DIAGNOSIS — Z510 Encounter for antineoplastic radiation therapy: Secondary | ICD-10-CM | POA: Diagnosis not present

## 2020-07-19 ENCOUNTER — Ambulatory Visit
Admission: RE | Admit: 2020-07-19 | Discharge: 2020-07-19 | Disposition: A | Discharge status: Home / Self Care | Payer: No Typology Code available for payment source | Source: Ambulatory Visit | Attending: Radiation Oncology | Admitting: Radiation Oncology

## 2020-07-19 DIAGNOSIS — Z510 Encounter for antineoplastic radiation therapy: Secondary | ICD-10-CM | POA: Diagnosis not present

## 2020-07-20 ENCOUNTER — Encounter: Payer: Self-pay | Admitting: Radiation Oncology

## 2020-07-20 ENCOUNTER — Ambulatory Visit
Admission: RE | Admit: 2020-07-20 | Discharge: 2020-07-20 | Disposition: A | Discharge status: Home / Self Care | Payer: No Typology Code available for payment source | Source: Ambulatory Visit | Attending: Radiation Oncology | Admitting: Radiation Oncology

## 2020-07-20 ENCOUNTER — Other Ambulatory Visit: Payer: Self-pay

## 2020-07-20 DIAGNOSIS — Z510 Encounter for antineoplastic radiation therapy: Secondary | ICD-10-CM | POA: Diagnosis not present

## 2020-07-20 NOTE — Progress Notes (Signed)
The patient came around after his treatment today after feeling lightheaded and dizzy after getting off the table.  In summary this is a patient who is currently being treated for pancreas cancer and is receiving definitive chemoradiation.  He has had 20 one of the planned 28 fractions to the pancreas and has been tolerating this well however today when he was getting up off the table he felt very dizzy and lightheaded.  After standing and then sitting back down he has been feeling better.  Orthostatic blood pressures were checked, and he does have a 20 mmHg discrepancy between sitting and standing.  His pulse rate is stable during these times, he denies any chest pain shortness of breath fevers, chills, loss of consciousness nausea or vomiting weight changes, and specifically states he has been doing quite well with treatment and has been able to eat and drink normally.  In review of his [facility] list, he is taking 4 separate medications that could influence his blood pressure including beta-blockers, ARB, and two diuretics given this, I suggested that he follow-up with the provider who is currently prescribing his medications since he had recently been hospitalized with congestive heart failure.  They may need to make some alterations.  He is also aware that there is not a treatment for orthostasis specifically but needs to slowly get up off the table perhaps with more assistance than he is used to if this continues.  He is also interested in moving his treatment for the last day of therapy as he is also going to be having a vertebroplasty at the [place].  We will try and accommodate this if he can finish his treatment as well as proceed with evaluation and vertebral augmentation on 07/29/2020.     Carola Rhine, PAC

## 2020-07-21 ENCOUNTER — Telehealth: Payer: Self-pay | Admitting: Medical

## 2020-07-21 ENCOUNTER — Ambulatory Visit
Admission: RE | Admit: 2020-07-21 | Discharge: 2020-07-21 | Disposition: A | Discharge status: Home / Self Care | Payer: No Typology Code available for payment source | Source: Ambulatory Visit | Attending: Radiation Oncology | Admitting: Radiation Oncology

## 2020-07-21 DIAGNOSIS — Z510 Encounter for antineoplastic radiation therapy: Secondary | ICD-10-CM | POA: Diagnosis not present

## 2020-07-21 NOTE — Telephone Encounter (Signed)
     Pt said he sent a pre op clearance yesterday to fax# [phone], he will have a procedure on  04/01. Also he would like to speak with Daleen Snook about his dizziness he still have the same problem since his last OV in February

## 2020-07-22 ENCOUNTER — Other Ambulatory Visit: Payer: Self-pay

## 2020-07-22 ENCOUNTER — Telehealth: Payer: Self-pay | Admitting: Medical

## 2020-07-22 ENCOUNTER — Ambulatory Visit
Admission: RE | Admit: 2020-07-22 | Discharge: 2020-07-22 | Disposition: A | Discharge status: Home / Self Care | Payer: No Typology Code available for payment source | Source: Ambulatory Visit | Attending: Radiation Oncology | Admitting: Radiation Oncology

## 2020-07-22 DIAGNOSIS — Z510 Encounter for antineoplastic radiation therapy: Secondary | ICD-10-CM | POA: Diagnosis not present

## 2020-07-22 NOTE — Telephone Encounter (Signed)
   Primary Cardiologist: Kirk Ruths, MD  Chart reviewed as part of pre-operative protocol coverage. Given past medical history and time since last visit, based on ACC/AHA guidelines, JAKEIM SEDORE would be at acceptable risk for the planned procedure without further cardiovascular testing.  Patient was recently seen by my colleague and was already cleared for the surgery.  He denies any recent chest discomfort or shortness of breath.  He has been instructed to start holding aspirin and Plavix today and resume as soon as possible after the surgery at the surgeon's discretion.  This will give him a 7 days hold for both aspirin and Plavix.  The patient was advised that if he develops new symptoms prior to surgery to contact our office to arrange for a follow-up visit, and he verbalized understanding.  I will route this recommendation to the requesting party via Epic fax function and remove from pre-op pool.  Please call with questions.  [place], [place] 07/22/2020, 3:50 PM

## 2020-07-22 NOTE — Telephone Encounter (Signed)
   [facility] Pre-operative Risk Assessment    Request for surgical clearance:  1. What type of surgery is being performed? kyphoplasty  2. When is this surgery scheduled? 07/29/20   3. What type of clearance is required (medical clearance vs. Pharmacy clearance to hold med vs. Both)? Hold meds clopidogrel (PLAVIX) 75 MG tablet  4. Are there any medications that need to be held prior to surgery and how [place]? 7 days before (patient would need to stop today in ordered to be ready for the procedure on 07/29/20)  5. Practice name and name of physician performing surgery? Dr. Ellard Artis  6. What is your office phone number [phone]   7.   What is your office fax number [phone]  8.   Anesthesia type (None, local, MAC, general) ? general   Milbert Coulter 07/22/2020, 1:57 PM  ________________________________________________________________    (provider comments below)

## 2020-07-22 NOTE — Telephone Encounter (Signed)
   I do not see a formal preop request generated yet, however I did speak with Dr. Stanford Breed following his last visit and he is cleared to hold aspirin and plavix 5-7 days prior to his upcoming kyphoplasty if needed. Please relay this message to the patient.   Additionally, can you arrange for an office visit with me 07/26/20 in the 11:45am slot that is blocked off. Please ask him to keep a log of his blood pressures, morning and night, to bring to his visit. Please add "check orthostatic vitals" to appointment comments so whoever is checking him in can have them available. Thank you!!  Abigail Butts, PA-C 07/22/20; 9:46 AM

## 2020-07-22 NOTE — Telephone Encounter (Signed)
Spoke with pt, aware of krista's recommendations.Follow up scheduled. Patient will try faxing clearance again.

## 2020-07-23 ENCOUNTER — Other Ambulatory Visit: Payer: Self-pay | Admitting: Medical

## 2020-07-25 ENCOUNTER — Other Ambulatory Visit: Payer: Self-pay

## 2020-07-25 ENCOUNTER — Ambulatory Visit
Admission: RE | Admit: 2020-07-25 | Discharge: 2020-07-25 | Disposition: A | Discharge status: Home / Self Care | Payer: No Typology Code available for payment source | Source: Ambulatory Visit | Attending: Radiation Oncology | Admitting: Radiation Oncology

## 2020-07-25 DIAGNOSIS — Z510 Encounter for antineoplastic radiation therapy: Secondary | ICD-10-CM | POA: Diagnosis not present

## 2020-07-25 NOTE — Telephone Encounter (Addendum)
Clearance was entered on 07/22/20 it was labeled incorrectly, addend the clearance so the label says "Clearance 07/29/20" and not just "Clearance". Patient was cleared on 07/23/19 by Almyra Deforest, PA-C who was covering APP that day. Patient was instructed by Almyra Deforest, PA-C that he will need to hold his ASA and Plavix for 7 days prior to procedure and if he develops new symptoms prior to surgery to contact our office to arrange a follow up appointment. Almyra Deforest, PA-C faxed the clearance to the requesting office via [place] fax function.

## 2020-07-26 ENCOUNTER — Ambulatory Visit
Admission: RE | Admit: 2020-07-26 | Discharge: 2020-07-26 | Disposition: A | Discharge status: Home / Self Care | Payer: No Typology Code available for payment source | Source: Ambulatory Visit | Attending: Radiation Oncology | Admitting: Radiation Oncology

## 2020-07-26 DIAGNOSIS — Z510 Encounter for antineoplastic radiation therapy: Secondary | ICD-10-CM | POA: Diagnosis not present

## 2020-07-27 ENCOUNTER — Ambulatory Visit (INDEPENDENT_AMBULATORY_CARE_PROVIDER_SITE_OTHER): Payer: No Typology Code available for payment source | Admitting: Medical

## 2020-07-27 ENCOUNTER — Encounter: Payer: Self-pay | Admitting: *Deleted

## 2020-07-27 ENCOUNTER — Encounter: Payer: Self-pay | Admitting: Medical

## 2020-07-27 ENCOUNTER — Ambulatory Visit
Admission: RE | Admit: 2020-07-27 | Discharge: 2020-07-27 | Disposition: A | Discharge status: Home / Self Care | Payer: No Typology Code available for payment source | Source: Ambulatory Visit | Attending: Radiation Oncology | Admitting: Radiation Oncology

## 2020-07-27 ENCOUNTER — Ambulatory Visit (INDEPENDENT_AMBULATORY_CARE_PROVIDER_SITE_OTHER): Payer: No Typology Code available for payment source

## 2020-07-27 ENCOUNTER — Other Ambulatory Visit: Payer: Self-pay

## 2020-07-27 VITALS — BP 124/90 | HR 135 | Ht 67.0 in | Wt 176.0 lb

## 2020-07-27 DIAGNOSIS — I471 Supraventricular tachycardia: Secondary | ICD-10-CM | POA: Diagnosis not present

## 2020-07-27 DIAGNOSIS — Z510 Encounter for antineoplastic radiation therapy: Secondary | ICD-10-CM | POA: Diagnosis not present

## 2020-07-27 DIAGNOSIS — Z951 Presence of aortocoronary bypass graft: Secondary | ICD-10-CM

## 2020-07-27 DIAGNOSIS — I5042 Chronic combined systolic (congestive) and diastolic (congestive) heart failure: Secondary | ICD-10-CM | POA: Diagnosis not present

## 2020-07-27 DIAGNOSIS — I2510 Atherosclerotic heart disease of native coronary artery without angina pectoris: Secondary | ICD-10-CM

## 2020-07-27 DIAGNOSIS — R55 Syncope and collapse: Secondary | ICD-10-CM

## 2020-07-27 DIAGNOSIS — E785 Hyperlipidemia, unspecified: Secondary | ICD-10-CM

## 2020-07-27 DIAGNOSIS — I10 Essential (primary) hypertension: Secondary | ICD-10-CM

## 2020-07-27 MED ORDER — METOPROLOL SUCCINATE ER 50 MG PO TB24: 50.0000 mg | ORAL_TABLET | Freq: Every day | ORAL | 3 refills | Status: DC

## 2020-07-27 NOTE — Patient Instructions (Addendum)
Medication Instructions:  STOP- Carvedilol START- Metoprolol Succinate 50 mg by mouth daily INCREASE- Furosemide(Lasix) 40 mg(2 tablets) by mouth daily for 3 days then back to 20 mg daily DECREASE- Losartan 12.5 mg(1/2 tablet) by mouth daily  *If you need a refill on your cardiac medications before your next appointment, please call your pharmacy*   Lab Work: BMP and Magnesium   Testing/Procedures: Your physician has recommended that you wear a 7 day Zio monitor. Holter monitors are medical devices that record the heart's electrical activity. Doctors most often use these monitors to diagnose arrhythmias. Arrhythmias are problems with the speed or rhythm of the heartbeat. The monitor is a small, portable device. You can wear one while you do your normal daily activities. This is usually used to diagnose what is causing palpitations/syncope (passing out).   Follow-Up: At [facility], you and your health needs are our priority.  As part of our continuing mission to provide you with exceptional heart care, we have created designated Provider Care Teams.  These Care Teams include your primary Cardiologist (physician) and Advanced Practice Providers (APPs -  Physician Assistants and Nurse Practitioners) who all work together to provide you with the care you need, when you need it.  We recommend signing up for the patient portal called "MyChart".  Sign up information is provided on this After Visit Summary.  MyChart is used to connect with patients for Virtual Visits (Telemedicine).  Patients are able to view lab/test results, encounter notes, upcoming appointments, etc.  Non-urgent messages can be sent to your provider as well.   To learn more about what you can do with MyChart, go to [email or web address].    Your next appointment:   Keep appointment on May 16th @ 8:40 pm  The format for your next appointment:   In Person  Provider:   Kirk Ruths, MD   Other Instructions  [place] Monitor Instructions   Your physician has requested you wear your ZIO patch monitor 7 days.   This is a single patch monitor.  [employer] supplies one patch monitor per enrollment.  Additional stickers are not available.   Please do not apply patch if you will be having a Nuclear Stress Test, Echocardiogram, Cardiac CT, MRI, or Chest Xray during the time frame you would be wearing the monitor. The patch cannot be worn during these tests.  You cannot remove and re-apply the ZIO XT patch monitor.   Your ZIO patch monitor will be sent USPS Priority mail from [facility] directly to your home address. The monitor may also be mailed to a PO BOX if home delivery is not available.   It may take 3-5 days to receive your monitor after you have been enrolled.   Once you have received you monitor, please review enclosed instructions.  Your monitor has already been registered assigning a specific monitor serial # to you.   Applying the monitor   Shave hair from upper left chest.   Hold abrader disc by orange tab.  Rub abrader in 40 strokes over left upper chest as indicated in your monitor instructions.   Clean area with 4 enclosed alcohol pads .  Use all pads to assure are is cleaned thoroughly.  Let dry.   Apply patch as indicated in monitor instructions.  Patch will be place under collarbone on left side of chest with arrow pointing upward.   Rub patch adhesive wings for 2 minutes.Remove white label marked "1".  Remove white label marked "  2".  Rub patch adhesive wings for 2 additional minutes.   While looking in a mirror, press and release button in center of patch.  A small green light will flash 3-4 times .  This will be your only indicator the monitor has been turned on.     Do not shower for the first 24 hours.  You may shower after the first 24 hours.   Press button if you feel a symptom. You will hear a small click.  Record Date, Time and Symptom in the Patient Log  Book.   When you are ready to remove patch, follow instructions on last 2 pages of Patient Log Book.  Stick patch monitor onto last page of Patient Log Book.   Place Patient Log Book in [place] box.  Use locking tab on box and tape box closed securely.  The [employer] and [employer] has [employer] on it.  Please place in mailbox as soon as possible.  Your physician should have your test results approximately 7 days after the monitor has been mailed back to [facility].   Call [place] at [phone] if you have questions regarding your ZIO XT patch monitor.  Call them immediately if you see an orange light blinking on your monitor.   If your monitor falls off in less than 4 days contact our Monitor department at [phone].  If your monitor becomes loose or falls off after 4 days call [employer] at [phone] for suggestions on securing your monitor.

## 2020-07-27 NOTE — Progress Notes (Signed)
Cardiology Office Note   Date:  07/27/2020   ID:  Isaiah, Patterson 08-31-1945, MRN [id number]  PCP:  [facility]  Cardiologist:  Kirk Ruths, MD EP: None  Chief Complaint  Patient presents with  . Dizziness      History of Present Illness: Isaiah Patterson is a 75 y.o. 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  He was last evaluated by cardiology at an outpatient visit with myself 06/20/20 for close follow-up of a pre-syncopal episode in office the previous visit 06/09/20, at which time his SBP dropped to 70s and HR in the 40s. His medications were adjusted and he was doing well at follow-up without any recurrent dizziness/lightheadedness. He was anticipating a possible kyphoplasty procedure in the near future for management of compression fracture in his spine as a result of a fall earlier this year. He was able to complete 4 METs at his last visit, and though will at elevated risk due to his cardiomyopathy, no further cardiac work-up was recommended and he was cleared to hold aspirin and plavix ahead of time.   Prior to this, he was admitted to the hospital from 06/01/20-06/03/20 for acute combined CHF and elevated HsTrop after presenting with DOE and LE edema. Echo that admission showed EF <20%, mild LVH, indeterminate LV diastolic function, moderately reduced RV systolic function, moderate LAE, no significant valvular abnormalities, and mildly dilated ascending aorta (92mm). He underwent a LHC to r/o ischemic etiology of his cardiomyopathy given elevated HsTrop (peat at 1600s) which showed severe native CAD with patent LIMA-LAD, SVG-D2, and SVG-OM1 with atretic free RIMA-rPDA unchanged from 2014. He was recommended for  medical therapy with aspirin and plavix for up to 12 months if tolerated, started on carvedilol 3.125mg  BID, and recommended for possible PCI to dLCx +/- RCA if recurrent symptoms. He was discharged home on carvedilol 3.125mg  BID and spironolactone, with consideration to start entresto outpatient if BP allows. He was felt to be a poor candidate for ICD given recurrent pancreatic cancer.  He presents today with ongoing complaints of dizziness/lightheadedness. He has been undergoing radiation treatment for his recurrent pancreatic cancer since his last visit. For the past 2 weeks he reports recurrent dizziness/lightheadedness. He often has nausea following his radiation therapy resulting in a poor appetite. He has had several episodes of palpitations over the past couple weeks. He thinks his LE edema has been stable overall, though he did take an extra dose of lasix yesterday for increased swelling with improvement. He denies chest pain, SOB, orthopnea, PND, or syncope. He is still able to complete 4 METs without anginal complaints.    Orthostatic vitals in office today 130/72, HR 108 (laying), 113/72, HR 108 (sitting), 125/82, HR 137 (standing)      Past Medical History:  Diagnosis Date  . Arthritis    "hands, fingers" (02/03/2013)  . Blood dyscrasia    "told I was a free bleeder when I was young; my blood does have trouble clotting" (02/03/2013)  . Cancer ([place])   . Coronary artery disease    s/p CABG  . GERD (gastroesophageal reflux disease)   . Hyperlipidemia   . Hypertension   . Myocardial infarct ([place]) 2000  . Pneumonia    "couple times" (02/03/2013)    Past  Surgical History:  Procedure Laterality Date  . BILIARY DILATION  04/02/2019   Procedure: BILIARY DILATION;  Surgeon: Arta Silence, MD;  Location: [facility] ENDOSCOPY;  Service: Endoscopy;;  . BILIARY STENT PLACEMENT  04/02/2019   Procedure: BILIARY STENT PLACEMENT;  Surgeon: Arta Silence, MD;  Location: [place] ENDOSCOPY;  Service:  Endoscopy;;  . CORONARY ARTERY BYPASS GRAFT  2000   "CABG X5" (02/03/2013)  . ERCP N/A 04/02/2019   Procedure: ENDOSCOPIC RETROGRADE CHOLANGIOPANCREATOGRAPHY (ERCP);  Surgeon: Arta Silence, MD;  Location: [facility] ENDOSCOPY;  Service: Endoscopy;  Laterality: N/A;  . ESOPHAGOGASTRODUODENOSCOPY (EGD) WITH PROPOFOL N/A 04/02/2019   Procedure: ESOPHAGOGASTRODUODENOSCOPY (EGD) WITH PROPOFOL;  Surgeon: Arta Silence, MD;  Location: [place];  Service: Endoscopy;  Laterality: N/A;  . EUS N/A 04/02/2019   Procedure: UPPER ENDOSCOPIC ULTRASOUND (EUS) LINEAR;  Surgeon: Arta Silence, MD;  Location: [place];  Service: Endoscopy;  Laterality: N/A;  . FINE NEEDLE ASPIRATION  04/02/2019   Procedure: FINE NEEDLE ASPIRATION (FNA) LINEAR;  Surgeon: Arta Silence, MD;  Location: [place] ENDOSCOPY;  Service: Endoscopy;;  . LEFT HEART CATH AND CORS/GRAFTS ANGIOGRAPHY N/A 06/02/2020   Procedure: LEFT HEART CATH AND CORS/GRAFTS ANGIOGRAPHY;  Surgeon: Nelva Bush, MD;  Location: [place] CV LAB;  Service: Cardiovascular;  Laterality: N/A;  . LEFT HEART CATHETERIZATION WITH CORONARY/GRAFT ANGIOGRAM N/A 02/04/2013   Procedure: LEFT HEART CATHETERIZATION WITH Beatrix Fetters;  Surgeon: Candee Furbish, MD;  Location: [facility] CATH LAB;  Service: Cardiovascular;  Laterality: N/A;  . PANCREATIC STENT PLACEMENT  04/02/2019   Procedure: PANCREATIC STENT PLACEMENT;  Surgeon: Arta Silence, MD;  Location: [facility] ENDOSCOPY;  Service: Endoscopy;;  . SPHINCTEROTOMY  04/02/2019   Procedure: SPHINCTEROTOMY;  Surgeon: Arta Silence, MD;  Location: [place] ENDOSCOPY;  Service: Endoscopy;;  . TONSILLECTOMY  1950's     Current Outpatient Medications  Medication Sig Dispense Refill  . aspirin EC 81 MG tablet Take 1 tablet (81 mg total) by mouth daily. 30 tablet 11  . atorvastatin (LIPITOR) 80 MG tablet Take 1 tablet (80 mg total) by mouth daily. 90 tablet 3  . calcium carbonate (OS-CAL) 600 MG TABS tablet Take 600 mg by mouth  daily.    . capecitabine (XELODA) 500 MG tablet daily in the afternoon.    . clopidogrel (PLAVIX) 75 MG tablet Take 1 tablet (75 mg total) by mouth daily with breakfast. 90 tablet 3  . DULoxetine (CYMBALTA) 30 MG capsule Take 30 mg by mouth daily.    . ferrous sulfate 325 (65 FE) MG tablet Take 325 mg by mouth daily with breakfast.    . furosemide (LASIX) 20 MG tablet TAKE 1 TABLET BY MOUTH EVERY DAY 90 tablet 3  . isosorbide dinitrate (ISORDIL) 30 MG tablet Take 0.5 tablets (15 mg total) by mouth daily. 50 tablet 3  . levothyroxine (SYNTHROID) 75 MCG tablet Take 75 mcg by mouth daily before breakfast.    . losartan (COZAAR) 25 MG tablet Take 12.5 mg by mouth daily.    . metoprolol succinate (TOPROL-XL) 50 MG 24 hr tablet Take 1 tablet (50 mg total) by mouth daily. Take with or immediately following a meal. 90 tablet 3  . Multiple Vitamin (MULTIVITAMIN WITH MINERALS) TABS tablet Take 1 tablet by mouth daily.    . naloxone (NARCAN) nasal spray 4 mg/0.1 mL SPRAY 1 SPRAY INTO NOSTRIL ONCE FOR OPIOID OVERDOSE (TURN PERSON ON SIDE AFTER DOSE. IF NO RESPONSE IN 2-3 MINUTES OR PERSON RESPONDS BUT RELAPSES, REPEAT USING A NEW SPRAY DEVICE AND SPRAY INTO THE OTHER NOSTRIL.  CALL 911 AFTER USE.) * EMERGENCY USE ONLY *    . nitroGLYCERIN (NITROSTAT) 0.4 MG SL tablet Place 1 tablet (0.4 mg total) under the tongue every 5 (five) minutes as needed for chest pain. 30 tablet 5  . ondansetron (ZOFRAN) 8 MG tablet Take 1 tablet (8 mg total) by mouth every 8 (eight) hours as needed for nausea or vomiting. 30 tablet 1  . oxyCODONE-acetaminophen (PERCOCET/ROXICET) 5-325 MG tablet TAKE 1 TABLET BY MOUTH EVERY 8 HOURS AS NEEDED FOR PAIN FOR UP TO 7 DAYS    . pantoprazole (PROTONIX) 40 MG tablet Take 1 tablet (40 mg total) by mouth daily. 90 tablet 3  . prochlorperazine (COMPAZINE) 10 MG tablet Take 1 tablet by mouth every 8 (eight) hours.    Marland Kitchen spironolactone (ALDACTONE) 25 MG tablet Take 0.5 tablets (12.5 mg total) by  mouth every other day. 30 tablet 0   No current facility-administered medications for this visit.    Allergies:   Shellfish allergy and Trazodone and nefazodone    Social History:  The patient  reports that he has quit smoking. His smoking use included cigarettes. He has a 25.00 pack-year smoking history. He has never used smokeless tobacco. He reports previous alcohol use. He reports that he does not use drugs.   Family History:  The patient's family history includes Heart disease in his father and mother.    ROS:  Please see the history of present illness.   Otherwise, review of systems are positive for none.   All other systems are reviewed and negative.    PHYSICAL EXAM: VS:  BP 124/90   Pulse (!) 135   Ht 5\' 7"  (1.702 m)   Wt 176 lb (79.8 kg)   SpO2 97%   BMI 27.57 kg/m  , BMI Body mass index is 27.57 kg/m. GEN: Well nourished, well developed, in no acute distress HEENT: sclera anicteric Neck: no JVD, carotid bruits, or masses Cardiac: IRIR; no murmurs, rubs, or gallops, 1+ LE edema  Respiratory:  clear to auscultation bilaterally, normal work of breathing GI: soft, nontender, nondistended, + BS MS: no deformity or atrophy Skin: warm and dry, no rash Neuro:  Strength and sensation are intact Psych: euthymic mood, full affect   EKG:  EKG is ordered today. The ekg ordered today demonstrates multifocal atrial tachycardia with rate 121 bpm, PVCs, non-specific T wave abnormalities, no STE/D.    Recent Labs: 06/01/2020: B Natriuretic Peptide 3,690.0 06/02/2020: ALT 37 06/03/2020: Hemoglobin 11.5; Magnesium 2.3; Platelets 372 06/20/2020: BUN 13; Creatinine, Ser 1.07; Potassium 4.4; Sodium 142    Lipid Panel    Component Value Date/Time   CHOL 157 02/04/2013 0635   TRIG 151 (H) 02/04/2013 0635   HDL 53 02/04/2013 0635   CHOLHDL 3.0 02/04/2013 0635   VLDL 30 02/04/2013 0635   LDLCALC 74 02/04/2013 0635      Wt Readings from Last 3 Encounters:  07/27/20 176 lb (79.8  kg)  06/20/20 181 lb (82.1 kg)  06/09/20 178 lb (80.7 kg)      Other studies Reviewed: Additional studies/ records that were reviewed today include:   [place] 06/02/20: Conclusions: 1. Severe native coronary artery disease, including chronic total occlusions of the ostial LAD and OM1, as well as 60% and 80% ostial/proximal ramus lesions, 80% distal LCx stenosis, and diffuse mid RCA disease of up to 60-70% with heavy calcification. Distal LCx lesion has worsened since last catheterization in 2014; otherwise the has been no significant change. 2. Widely patent  LIMA-LAD, SVG-D2, and SVG-OM1. 3. Atretic free RIMA-rPDA is unchanged since 2014. 4. Low normal left ventricular filling pressure (LVEDP ~5 mmHg). 5. Paroxysmal SVT noted during left heart catheterization, which was self-limited and lasted < 5 minutes.  Recommendations: 1. 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 Severely reduced LVEF appears out of proportion to degree of coronary artery disease. I will add carvedilol 3.125 mg twice daily. 2. Maintain net even fluid balance. 3. Dual antiplatelet therapy with aspirin and clopidogrel for up to 12 months, as tolerated. 4. If the patient has recurrent symptoms, PCI to distal LCx could be considered, as well as functional assessment of RCA and ramus intermedius disease. 5. Aggressive secondary prevention.    Echocardiogram 06/01/20: 1. Left ventricular ejection fraction, by estimation, is <20%. The left  ventricle has severely decreased function. The left ventricle demonstrates  global hypokinesis. There is mild left ventricular hypertrophy. Left  ventricular diastolic parameters are  indeterminate.  2. Right ventricular systolic function is moderately reduced. The right  ventricular size is mildly enlarged. Tricuspid regurgitation signal is  inadequate for assessing PA pressure.  3. Left atrial size was moderately dilated.  4. The mitral  valve is normal in structure. Trivial mitral valve  regurgitation.  5. The aortic valve is tricuspid. Aortic valve regurgitation is not  visualized. Mild to moderate aortic valve sclerosis/calcification is  present, without any evidence of aortic stenosis.  6. Aortic dilatation noted. There is mild dilatation of the ascending  aorta, measuring 39 mm.  7. The inferior vena cava is dilated in size with <50% respiratory  variability, suggesting right atrial pressure of 15 mmHg.  8. There is a filling defect adjacent to anteroapical wall on contrast  images (best seen on images 104,105). Suspect this represents prominent LV  trabeculation rather than thrombus. Would consider cardiac MRI both to  rule out thrombus and evaluate for  etiology of cardiomyopathy     ASSESSMENT AND PLAN:   1.Multifocal atrial tachycardia:suspect this is the etiology of his recent dizziness/lightheadedness as HR's are frequently up to 120s-130s recently.  - Will stop carvedilol and transition to metoprolol succinate 50mg  daily for improved HR control - Will check a 7 day monitor to ensure adequate HR control and r/o other arrhythmias - Will check BMET and Mg today for close monitoring of electrolytes  2.Chronic combined CHF: [id number] today, down from 181lbs at last visit, stable from 178lbsat visit 06/09/20. He took an additional dose of lasix yesterday with improvement in symptoms - Will increase lasix to 40mg  daily x3 days, then resume 20mg  daily - Will reduce losartan to 12.5mg  daily to allow room in BP for management of #1 - Continue carvedilol, spironolactone, and isordil as above - Will repeat a BMET and Mg today for close monitoring if kidney function/electrolytes given elevated potassium at last visit.  - Echocardiogram scheduled for 08/2020 to evaluate for improvement in LV function. Consider referral to EP if EF remains <35%.   3. CAD s/p remote CABG:stable LHC during recent admission,  now on aspirin and plavix. No anginal complaints - Continue aspirin and plavix - Continue atorvastatin  4. HTN:BP 124/90 today which is typically where his blood pressure has been at home.  - Managed in the context of #2  5. HLD: no recent lipids on file - Continue atorvastatin  6. GERD: recently transitioned from omeprazole to pantoprazole to avoid potential interaction with plavix - Continue pantoprazole  7. Pancreatic cancer s/p Whipple with recent recurrence:currently undergoing XRT -  Continue management per his oncologist/RadOnc  8. Preop assessment: plans for kyphoplasty 07/29/20 due to persistent back pain after a fall. He can complete 4 METs without anginal complaints, though remains at higher risk in light of his recent cardiomyopathy diagnosis.  - No further cardiac work-up is needed prior to surgery - Okay to hold aspirin and plavix 5-7 days prior to his upcoming procedure.     Current medicines are reviewed at length with the patient today.  The patient does not have concerns regarding medicines.  The following changes have been made:  As above  Labs/ tests ordered today include:   Orders Placed This Encounter  Procedures  . Basic Metabolic Panel (BMET)  . Magnesium  . [place] TERM MONITOR (3-14 DAYS)  . EKG 12-Lead     Disposition:   FU with Dr. Stanford Breed as scheduled 09/12/20  Signed, Abigail Butts, PA-C  07/27/2020 3:18 PM

## 2020-07-27 NOTE — Progress Notes (Signed)
Patient ID: Isaiah Patterson, male   DOB: [birth year], 75 y.o.   MRN: [id number] Patient enrolled for [employer] to ship a 7 day ZIO XT [name] term holter monitor to his home.

## 2020-07-28 ENCOUNTER — Ambulatory Visit
Admission: RE | Admit: 2020-07-28 | Discharge: 2020-07-28 | Disposition: A | Discharge status: Home / Self Care | Payer: No Typology Code available for payment source | Source: Ambulatory Visit | Attending: Radiation Oncology | Admitting: Radiation Oncology

## 2020-07-28 ENCOUNTER — Other Ambulatory Visit: Payer: Self-pay

## 2020-07-28 ENCOUNTER — Other Ambulatory Visit: Payer: Self-pay | Admitting: *Deleted

## 2020-07-28 DIAGNOSIS — Z510 Encounter for antineoplastic radiation therapy: Secondary | ICD-10-CM | POA: Diagnosis not present

## 2020-07-28 DIAGNOSIS — I5042 Chronic combined systolic (congestive) and diastolic (congestive) heart failure: Secondary | ICD-10-CM

## 2020-07-28 DIAGNOSIS — Z79899 Other long term (current) drug therapy: Secondary | ICD-10-CM

## 2020-07-28 LAB — BASIC METABOLIC PANEL
BUN/Creatinine Ratio: 9 — ABNORMAL LOW (ref 10–24)
BUN: 6 mg/dL — ABNORMAL LOW (ref 8–27)
CO2: 25 mmol/L (ref 20–29)
Calcium: 8.7 mg/dL (ref 8.6–10.2)
Chloride: 104 mmol/L (ref 96–106)
Creatinine, Ser: 0.67 mg/dL — ABNORMAL LOW (ref 0.76–1.27)
Glucose: 113 mg/dL — ABNORMAL HIGH (ref 65–99)
Potassium: 3.4 mmol/L — ABNORMAL LOW (ref 3.5–5.2)
Sodium: 143 mmol/L (ref 134–144)
eGFR: 97 mL/min/{1.73_m2} (ref >59)

## 2020-07-28 LAB — MAGNESIUM: Magnesium: 1.8 mg/dL (ref 1.6–2.3)

## 2020-07-28 MED ORDER — POTASSIUM CHLORIDE ER 10 MEQ PO TBCR: 10.0000 meq | EXTENDED_RELEASE_TABLET | Freq: Every day | ORAL | 3 refills | Status: DC

## 2020-07-28 MED ORDER — MAGNESIUM OXIDE 400 MG PO CAPS: 400.0000 mg | ORAL_CAPSULE | Freq: Every day | ORAL | 3 refills | Status: DC

## 2020-07-28 NOTE — Telephone Encounter (Signed)
Pt is calling in regards to this due to his surgery being tomorrow. Please advise

## 2020-07-29 ENCOUNTER — Ambulatory Visit
Admission: RE | Admit: 2020-07-29 | Discharge: 2020-07-29 | Disposition: A | Discharge status: Home / Self Care | Payer: No Typology Code available for payment source | Source: Ambulatory Visit | Attending: Radiation Oncology | Admitting: Radiation Oncology

## 2020-07-29 ENCOUNTER — Other Ambulatory Visit: Payer: Self-pay

## 2020-07-29 ENCOUNTER — Encounter: Payer: Self-pay | Admitting: Radiation Oncology

## 2020-07-29 DIAGNOSIS — Z510 Encounter for antineoplastic radiation therapy: Secondary | ICD-10-CM | POA: Diagnosis not present

## 2020-07-29 DIAGNOSIS — C786 Secondary malignant neoplasm of retroperitoneum and peritoneum: Secondary | ICD-10-CM | POA: Insufficient documentation

## 2020-07-29 DIAGNOSIS — C250 Malignant neoplasm of head of pancreas: Secondary | ICD-10-CM | POA: Insufficient documentation

## 2020-07-30 DIAGNOSIS — I471 Supraventricular tachycardia: Secondary | ICD-10-CM | POA: Diagnosis not present

## 2020-08-01 MED ORDER — FUROSEMIDE 20 MG PO TABS: 20.0000 mg | ORAL_TABLET | Freq: Every day | ORAL | 3 refills | Status: DC

## 2020-08-02 ENCOUNTER — Other Ambulatory Visit: Payer: Self-pay

## 2020-08-02 DIAGNOSIS — I5042 Chronic combined systolic (congestive) and diastolic (congestive) heart failure: Secondary | ICD-10-CM

## 2020-08-02 DIAGNOSIS — Z79899 Other long term (current) drug therapy: Secondary | ICD-10-CM

## 2020-08-08 NOTE — Progress Notes (Signed)
  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 He did have diarrhea and also had some dizziness that was felt to be due to orthostatic hypotension. He was able to successfully complete treatment however.  Plan: The patient will receive a call in about one month from the radiation oncology department. He will continue follow up with Dr. Andris Baumann at the [facility] as well.   ________________________________________________    Carola Rhine, [facility]

## 2020-08-15 ENCOUNTER — Telehealth: Payer: Self-pay | Admitting: Cardiology

## 2020-08-15 NOTE — Telephone Encounter (Signed)
Spoke with Raquel Sarna with [employer]  ZIO patch worn 4/2 - 4/10   1412 runs of SVT  Fastest 8 beats, max rate 255bpm Longest 7 min 28 sec, avg 140bpm  46 runs of v-tach Fastest 13 beats of max rate 240bpm Average 200bpm  Asked Raquel Sarna to fax report for review  Per Daleen Snook PA 3/30 note: 1.Multifocal atrial tachycardia:suspect this is the etiology of his recent dizziness/lightheadedness as HR's are frequently up to 120s-130s recently.  - Will stop carvedilol and transition to metoprolol succinate 50mg  daily for improved HR control - Will check a 7 day monitor to ensure adequate HR control and r/o other arrhythmias - Will check BMET and Mg today for close monitoring of electrolytes

## 2020-08-15 NOTE — Telephone Encounter (Signed)
New message:     Raquel Sarna calling with a critical from  [facility]

## 2020-08-16 NOTE — Telephone Encounter (Signed)
MD has reviewed results and have been relayed to patient by primary nurse.   No fax was received to triage

## 2020-08-19 LAB — BASIC METABOLIC PANEL
BUN/Creatinine Ratio: 10 (ref 10–24)
BUN: 8 mg/dL (ref 8–27)
CO2: 22 mmol/L (ref 20–29)
Calcium: 9 mg/dL (ref 8.6–10.2)
Chloride: 107 mmol/L — ABNORMAL HIGH (ref 96–106)
Creatinine, Ser: 0.79 mg/dL (ref 0.76–1.27)
Glucose: 103 mg/dL — ABNORMAL HIGH (ref 65–99)
Potassium: 3.8 mmol/L (ref 3.5–5.2)
Sodium: 145 mmol/L — ABNORMAL HIGH (ref 134–144)
eGFR: 93 mL/min/{1.73_m2} (ref >59)

## 2020-08-19 LAB — MAGNESIUM: Magnesium: 1.8 mg/dL (ref 1.6–2.3)

## 2020-08-22 ENCOUNTER — Other Ambulatory Visit: Payer: Self-pay | Admitting: Medical

## 2020-08-30 ENCOUNTER — Encounter: Payer: Self-pay | Admitting: *Deleted

## 2020-08-30 NOTE — Progress Notes (Signed)
HPI: Follow-up coronary artery disease, chronic systolic congestive heart failure and cardiomyopathy.  Patient has had previous coronary artery bypass grafting.  Recently admitted after falling and fracturing his right humerus and also with thoracic compression fracture.  Developed CHF.  Echocardiogram 2/22 showed ejection fraction less than 20%, mild right ventricular enlargement, moderate left atrial enlargement and mildly dilated ascending aorta.  This cardiomyopathy was felt possibly to be ischemia mediated versus related to history of chemotherapy.  Cardiac catheterization February 2022 showed severe native coronary artery disease, patent LIMA to the LAD, patent saphenous vein graft to the second diagonal and patent saphenous vein graft to the first obtuse marginal.  The free RIMA to the PDA was atretic unchanged compared to 2014.  Patient has been treated medically.  Monitor April 2022 showed sinus bradycardia, normal sinus rhythm, PACs, PVCs, SVT with longest duration 7 minutes and 38 seconds, 13 beats nonsustained ventricular tachycardia.  Echocardiogram May 2022 showed ejection fraction less than 20%, moderate left ventricular enlargement, severe biatrial enlargement, moderate mitral regurgitation.  Patient also has a history of pancreatic cancer status post Whipple now with recurrence.  Since last seen he has some fatigue but denies dyspnea, chest pain, palpitations or syncope.  Minimal pedal edema.  Current Outpatient Medications  Medication Sig Dispense Refill  . aspirin EC 81 MG tablet Take 1 tablet (81 mg total) by mouth daily. 30 tablet 11  . atorvastatin (LIPITOR) 80 MG tablet Take 1 tablet (80 mg total) by mouth daily. 90 tablet 3  . clopidogrel (PLAVIX) 75 MG tablet TAKE 1 TABLET BY MOUTH DAILY WITH BREAKFAST. 90 tablet 3  . DULoxetine (CYMBALTA) 30 MG capsule Take 30 mg by mouth daily.    . furosemide (LASIX) 20 MG tablet Take 1 tablet (20 mg total) by mouth daily. 90 tablet 3   . isosorbide dinitrate (ISORDIL) 30 MG tablet Take 0.5 tablets (15 mg total) by mouth daily. 50 tablet 3  . levothyroxine (SYNTHROID) 75 MCG tablet Take 75 mcg by mouth daily before breakfast.    . losartan (COZAAR) 25 MG tablet Take 12.5 mg by mouth daily.    . Magnesium Oxide 400 MG CAPS Take 1 capsule (400 mg total) by mouth daily. 90 capsule 3  . metoprolol succinate (TOPROL-XL) 50 MG 24 hr tablet Take 1 tablet (50 mg total) by mouth daily. Take with or immediately following a meal. 90 tablet 3  . Multiple Vitamin (MULTIVITAMIN WITH MINERALS) TABS tablet Take 1 tablet by mouth daily.    . ondansetron (ZOFRAN) 8 MG tablet Take 1 tablet (8 mg total) by mouth every 8 (eight) hours as needed for nausea or vomiting. 30 tablet 1  . oxyCODONE-acetaminophen (PERCOCET/ROXICET) 5-325 MG tablet TAKE 1 TABLET BY MOUTH EVERY 8 HOURS AS NEEDED FOR PAIN FOR UP TO 7 DAYS    . pantoprazole (PROTONIX) 40 MG tablet Take 1 tablet (40 mg total) by mouth daily. 90 tablet 3  . potassium chloride (KLOR-CON) 10 MEQ tablet Take 1 tablet (10 mEq total) by mouth daily. 90 tablet 3  . spironolactone (ALDACTONE) 25 MG tablet Take 0.5 tablets (12.5 mg total) by mouth every other day. 30 tablet 0  . calcium carbonate (OS-CAL) 600 MG TABS tablet Take 600 mg by mouth daily.    . capecitabine (XELODA) 500 MG tablet daily in the afternoon.    . ferrous sulfate 325 (65 FE) MG tablet Take 325 mg by mouth daily with breakfast. (Patient not taking: Reported on 09/12/2020)    .  naloxone (NARCAN) nasal spray 4 mg/0.1 mL SPRAY 1 SPRAY INTO NOSTRIL ONCE FOR OPIOID OVERDOSE (TURN PERSON ON SIDE AFTER DOSE. IF NO RESPONSE IN 2-3 MINUTES OR PERSON RESPONDS BUT RELAPSES, REPEAT USING A NEW SPRAY DEVICE AND SPRAY INTO THE OTHER NOSTRIL. CALL 911 AFTER USE.) * EMERGENCY USE ONLY *    . nitroGLYCERIN (NITROSTAT) 0.4 MG SL tablet Place 1 tablet (0.4 mg total) under the tongue every 5 (five) minutes as needed for chest pain. 30 tablet 5  .  prochlorperazine (COMPAZINE) 10 MG tablet Take 1 tablet by mouth every 8 (eight) hours.     No current facility-administered medications for this visit.     Past Medical History:  Diagnosis Date  . Arthritis    "hands, fingers" (02/03/2013)  . Blood dyscrasia    "told I was a free bleeder when I was young; my blood does have trouble clotting" (02/03/2013)  . Cancer ([place])   . Coronary artery disease    s/p CABG  . GERD (gastroesophageal reflux disease)   . Hyperlipidemia   . Hypertension   . Myocardial infarct ([place]) 2000  . Pneumonia    "couple times" (02/03/2013)    Past Surgical History:  Procedure Laterality Date  . BILIARY DILATION  04/02/2019   Procedure: BILIARY DILATION;  Surgeon: Arta Silence, MD;  Location: [facility] ENDOSCOPY;  Service: Endoscopy;;  . BILIARY STENT PLACEMENT  04/02/2019   Procedure: BILIARY STENT PLACEMENT;  Surgeon: Arta Silence, MD;  Location: [place] ENDOSCOPY;  Service: Endoscopy;;  . CORONARY ARTERY BYPASS GRAFT  2000   "CABG X5" (02/03/2013)  . ERCP N/A 04/02/2019   Procedure: ENDOSCOPIC RETROGRADE CHOLANGIOPANCREATOGRAPHY (ERCP);  Surgeon: Arta Silence, MD;  Location: [facility] ENDOSCOPY;  Service: Endoscopy;  Laterality: N/A;  . ESOPHAGOGASTRODUODENOSCOPY (EGD) WITH PROPOFOL N/A 04/02/2019   Procedure: ESOPHAGOGASTRODUODENOSCOPY (EGD) WITH PROPOFOL;  Surgeon: Arta Silence, MD;  Location: [place];  Service: Endoscopy;  Laterality: N/A;  . EUS N/A 04/02/2019   Procedure: UPPER ENDOSCOPIC ULTRASOUND (EUS) LINEAR;  Surgeon: Arta Silence, MD;  Location: [place];  Service: Endoscopy;  Laterality: N/A;  . FINE NEEDLE ASPIRATION  04/02/2019   Procedure: FINE NEEDLE ASPIRATION (FNA) LINEAR;  Surgeon: Arta Silence, MD;  Location: [place] ENDOSCOPY;  Service: Endoscopy;;  . LEFT HEART CATH AND CORS/GRAFTS ANGIOGRAPHY N/A 06/02/2020   Procedure: LEFT HEART CATH AND CORS/GRAFTS ANGIOGRAPHY;  Surgeon: Nelva Bush, MD;  Location: [place] CV LAB;  Service:  Cardiovascular;  Laterality: N/A;  . LEFT HEART CATHETERIZATION WITH CORONARY/GRAFT ANGIOGRAM N/A 02/04/2013   Procedure: LEFT HEART CATHETERIZATION WITH Beatrix Fetters;  Surgeon: Candee Furbish, MD;  Location: [facility] CATH LAB;  Service: Cardiovascular;  Laterality: N/A;  . PANCREATIC STENT PLACEMENT  04/02/2019   Procedure: PANCREATIC STENT PLACEMENT;  Surgeon: Arta Silence, MD;  Location: [facility] ENDOSCOPY;  Service: Endoscopy;;  . SPHINCTEROTOMY  04/02/2019   Procedure: SPHINCTEROTOMY;  Surgeon: Arta Silence, MD;  Location: [place] ENDOSCOPY;  Service: Endoscopy;;  . TONSILLECTOMY  1950's    Social History   Socioeconomic History  . Marital status: Divorced    Spouse name: Not on file  . Number of children: Not on file  . Years of education: Not on file  . Highest education level: Not on file  Occupational History  . Not on file  Tobacco Use  . Smoking status: Former Smoker    Packs/day: 1.00    Years: 25.00    Pack years: 25.00    Types: Cigarettes  . Smokeless tobacco: Never Used  . Tobacco  comment: 02/03/2013 "quit smoking ~ 25 yr ago"  Vaping Use  . Vaping Use: Every day  Substance and Sexual Activity  . Alcohol use: Not Currently    Comment: 02/03/2013 "stopped drinking alcohol in the 1990's; never had problem w/it"  . Drug use: No  . Sexual activity: Yes  Other Topics Concern  . Not on file  Social History Narrative  . Not on file   Social Determinants of Health   Financial Resource Strain: Low Risk   . Difficulty of Paying Living Expenses: Not hard at all  Food Insecurity: No Food Insecurity  . Worried About Charity fundraiser in the Last Year: Never true  . Ran Out of Food in the Last Year: Never true  Transportation Needs: No Transportation Needs  . Lack of Transportation (Medical): No  . Lack of Transportation (Non-Medical): No  Physical Activity: Not on file  Stress: Not on file  Social Connections: Not on file  Intimate Partner Violence: Not on file     Family History  Problem Relation Age of Onset  . Heart disease Mother   . Heart disease Father     ROS: Fatigue but no fevers or chills, productive cough, hemoptysis, dysphasia, odynophagia, melena, hematochezia, dysuria, hematuria, rash, seizure activity, orthopnea, PND, claudication. Remaining systems are negative.  Physical Exam: Well-developed well-nourished in no acute distress.  Skin is warm and dry.  HEENT is normal.  Neck is supple.  Chest is clear to auscultation with normal expansion.  Cardiovascular exam is regular rate and rhythm.  Abdominal exam nontender or distended. No masses palpated. Extremities show trace edema. neuro grossly intact  ECG-sinus rhythm with occasional PVC, nonspecific ST changes.  Personally reviewed  A/P  1 coronary artery disease-continue aspirin, Plavix and statin.  2 chronic systolic congestive heart failure-he is euvolemic on examination.  We will continue present medical regimen.  3 ischemic cardiomyopathy-continue low-dose losartan and toprol.  His losartan was decreased as an outpatient due to hypotension.  I will try to increase to 25 mg daily and advance as tolerated.  If he tolerates we will change to [facility] in the future.  He is not a candidate for ICD until we understand whether his pancreatic cancer has returned.  4 supraventricular tachycardia-continue beta-blocker.  5 history of hypertension-patient's blood pressure is elevated.  Increase losartan as outlined and follow.  6 hyperlipidemia-continue statin.  7 history of pancreatic cancer status post Whipple with recurrence-Per oncology.  Kirk Ruths, MD

## 2020-09-06 ENCOUNTER — Other Ambulatory Visit: Payer: Self-pay | Admitting: Medical

## 2020-09-06 NOTE — Telephone Encounter (Signed)
This is Dr. Crenshaw's pt 

## 2020-09-07 ENCOUNTER — Other Ambulatory Visit: Payer: Self-pay

## 2020-09-07 ENCOUNTER — Ambulatory Visit (HOSPITAL_COMMUNITY): Payer: No Typology Code available for payment source | Attending: Cardiology

## 2020-09-07 DIAGNOSIS — I5042 Chronic combined systolic (congestive) and diastolic (congestive) heart failure: Secondary | ICD-10-CM | POA: Diagnosis not present

## 2020-09-07 LAB — ECHOCARDIOGRAM COMPLETE
Area-P 1/2: 2.32 cm2
S' Lateral: 6.2 cm

## 2020-09-07 MED ORDER — PERFLUTREN LIPID MICROSPHERE
1.0000 mL | INTRAVENOUS | Status: AC | PRN
  Administered 2020-09-07: 1 mL via INTRAVENOUS

## 2020-09-12 ENCOUNTER — Encounter: Payer: Self-pay | Admitting: Cardiology

## 2020-09-12 ENCOUNTER — Ambulatory Visit (INDEPENDENT_AMBULATORY_CARE_PROVIDER_SITE_OTHER): Payer: No Typology Code available for payment source | Admitting: Cardiology

## 2020-09-12 ENCOUNTER — Other Ambulatory Visit: Payer: Self-pay

## 2020-09-12 VITALS — BP 150/78 | HR 64 | Ht 66.0 in | Wt 170.4 lb

## 2020-09-12 DIAGNOSIS — I10 Essential (primary) hypertension: Secondary | ICD-10-CM

## 2020-09-12 DIAGNOSIS — E785 Hyperlipidemia, unspecified: Secondary | ICD-10-CM | POA: Diagnosis not present

## 2020-09-12 DIAGNOSIS — I2510 Atherosclerotic heart disease of native coronary artery without angina pectoris: Secondary | ICD-10-CM | POA: Diagnosis not present

## 2020-09-12 MED ORDER — LOSARTAN POTASSIUM 25 MG PO TABS: 25.0000 mg | ORAL_TABLET | Freq: Every day | ORAL | 3 refills | Status: DC

## 2020-09-12 NOTE — Patient Instructions (Signed)
Medication Instructions:   INCREASE LOSARTAN TO 25 MG ONCE DAILY= 1 WHOLE TABLET ONCE DAILY  *If you need a refill on your cardiac medications before your next appointment, please call your pharmacy*   Follow-Up: At [facility], you and your health needs are our priority.  As part of our continuing mission to provide you with exceptional heart care, we have created designated Provider Care Teams.  These Care Teams include your primary Cardiologist (physician) and Advanced Practice Providers (APPs -  Physician Assistants and Nurse Practitioners) who all work together to provide you with the care you need, when you need it.  We recommend signing up for the patient portal called "MyChart".  Sign up information is provided on this After Visit Summary.  MyChart is used to connect with patients for Virtual Visits (Telemedicine).  Patients are able to view lab/test results, encounter notes, upcoming appointments, etc.  Non-urgent messages can be sent to your provider as well.   To learn more about what you can do with MyChart, go to [email or web address].    Your next appointment:   6 month(s)  The format for your next appointment:   In Person  Provider:   Kirk Ruths, MD

## 2020-10-10 ENCOUNTER — Ambulatory Visit
Admission: RE | Admit: 2020-10-10 | Discharge: 2020-10-10 | Disposition: A | Discharge status: Home / Self Care | Payer: No Typology Code available for payment source | Source: Ambulatory Visit | Attending: Radiation Oncology | Admitting: Radiation Oncology

## 2020-10-10 DIAGNOSIS — C259 Malignant neoplasm of pancreas, unspecified: Secondary | ICD-10-CM

## 2020-10-10 NOTE — Progress Notes (Signed)
  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 During treatment he did very well with radiotherapy and did not have significant desquamation. He reports he did however develop a tanning of the skin in the posterior chest. He denies any blistering or pain, or pustules/rash.   Impression/Plan: 1. 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 The patient has been doing well since completion of radiotherapy. We discussed that we would be happy to continue to follow him as needed, but he will also continue to follow up with Dr. Andris Baumann at the [facility] in medical oncology. I encouraged him to avoid sun exposure where he has noted the discoloration. It will likely fade over time, but if it becomes painful or rash forms we would be happy to see him back.      Carola Rhine, PAC

## 2021-02-21 ENCOUNTER — Ambulatory Visit: Payer: No Typology Code available for payment source | Admitting: Cardiology

## 2021-03-05 ENCOUNTER — Encounter (HOSPITAL_COMMUNITY): Payer: Self-pay

## 2021-03-05 ENCOUNTER — Inpatient Hospital Stay (HOSPITAL_COMMUNITY)
Admission: EM | Admit: 2021-03-05 | Discharge: 2021-03-08 | DRG: 871 | Disposition: A | Discharge status: Hospice | Payer: No Typology Code available for payment source | Attending: Pulmonary Disease | Admitting: Pulmonary Disease

## 2021-03-05 ENCOUNTER — Other Ambulatory Visit: Payer: Self-pay

## 2021-03-05 ENCOUNTER — Emergency Department (HOSPITAL_COMMUNITY): Payer: No Typology Code available for payment source

## 2021-03-05 DIAGNOSIS — Z91013 Allergy to seafood: Secondary | ICD-10-CM

## 2021-03-05 DIAGNOSIS — A419 Sepsis, unspecified organism: Secondary | ICD-10-CM

## 2021-03-05 DIAGNOSIS — Z20822 Contact with and (suspected) exposure to covid-19: Secondary | ICD-10-CM | POA: Diagnosis present

## 2021-03-05 DIAGNOSIS — R5381 Other malaise: Secondary | ICD-10-CM | POA: Diagnosis present

## 2021-03-05 DIAGNOSIS — E039 Hypothyroidism, unspecified: Secondary | ICD-10-CM | POA: Diagnosis present

## 2021-03-05 DIAGNOSIS — E871 Hypo-osmolality and hyponatremia: Secondary | ICD-10-CM | POA: Diagnosis present

## 2021-03-05 DIAGNOSIS — Z7189 Other specified counseling: Secondary | ICD-10-CM

## 2021-03-05 DIAGNOSIS — Z87891 Personal history of nicotine dependence: Secondary | ICD-10-CM

## 2021-03-05 DIAGNOSIS — D6489 Other specified anemias: Secondary | ICD-10-CM | POA: Diagnosis present

## 2021-03-05 DIAGNOSIS — R188 Other ascites: Secondary | ICD-10-CM | POA: Diagnosis present

## 2021-03-05 DIAGNOSIS — I998 Other disorder of circulatory system: Secondary | ICD-10-CM

## 2021-03-05 DIAGNOSIS — R579 Shock, unspecified: Secondary | ICD-10-CM

## 2021-03-05 DIAGNOSIS — E872 Acidosis, unspecified: Secondary | ICD-10-CM | POA: Diagnosis present

## 2021-03-05 DIAGNOSIS — N179 Acute kidney failure, unspecified: Secondary | ICD-10-CM | POA: Diagnosis present

## 2021-03-05 DIAGNOSIS — R7881 Bacteremia: Secondary | ICD-10-CM | POA: Diagnosis not present

## 2021-03-05 DIAGNOSIS — I471 Supraventricular tachycardia: Secondary | ICD-10-CM | POA: Diagnosis present

## 2021-03-05 DIAGNOSIS — C258 Malignant neoplasm of overlapping sites of pancreas: Secondary | ICD-10-CM | POA: Diagnosis not present

## 2021-03-05 DIAGNOSIS — Z789 Other specified health status: Secondary | ICD-10-CM | POA: Diagnosis not present

## 2021-03-05 DIAGNOSIS — Z711 Person with feared health complaint in whom no diagnosis is made: Secondary | ICD-10-CM

## 2021-03-05 DIAGNOSIS — I5042 Chronic combined systolic (congestive) and diastolic (congestive) heart failure: Secondary | ICD-10-CM

## 2021-03-05 DIAGNOSIS — R627 Adult failure to thrive: Secondary | ICD-10-CM | POA: Diagnosis present

## 2021-03-05 DIAGNOSIS — R160 Hepatomegaly, not elsewhere classified: Secondary | ICD-10-CM

## 2021-03-05 DIAGNOSIS — Z951 Presence of aortocoronary bypass graft: Secondary | ICD-10-CM

## 2021-03-05 DIAGNOSIS — I2510 Atherosclerotic heart disease of native coronary artery without angina pectoris: Secondary | ICD-10-CM | POA: Diagnosis present

## 2021-03-05 DIAGNOSIS — K219 Gastro-esophageal reflux disease without esophagitis: Secondary | ICD-10-CM | POA: Diagnosis present

## 2021-03-05 DIAGNOSIS — K750 Abscess of liver: Secondary | ICD-10-CM | POA: Diagnosis present

## 2021-03-05 DIAGNOSIS — Z86718 Personal history of other venous thrombosis and embolism: Secondary | ICD-10-CM

## 2021-03-05 DIAGNOSIS — C259 Malignant neoplasm of pancreas, unspecified: Secondary | ICD-10-CM | POA: Diagnosis present

## 2021-03-05 DIAGNOSIS — Z7902 Long term (current) use of antithrombotics/antiplatelets: Secondary | ICD-10-CM

## 2021-03-05 DIAGNOSIS — I255 Ischemic cardiomyopathy: Secondary | ICD-10-CM | POA: Diagnosis present

## 2021-03-05 DIAGNOSIS — I5023 Acute on chronic systolic (congestive) heart failure: Secondary | ICD-10-CM | POA: Diagnosis not present

## 2021-03-05 DIAGNOSIS — R791 Abnormal coagulation profile: Secondary | ICD-10-CM | POA: Diagnosis present

## 2021-03-05 DIAGNOSIS — A4151 Sepsis due to Escherichia coli [E. coli]: Secondary | ICD-10-CM | POA: Diagnosis present

## 2021-03-05 DIAGNOSIS — I110 Hypertensive heart disease with heart failure: Secondary | ICD-10-CM | POA: Diagnosis present

## 2021-03-05 DIAGNOSIS — Z7982 Long term (current) use of aspirin: Secondary | ICD-10-CM

## 2021-03-05 DIAGNOSIS — Z66 Do not resuscitate: Secondary | ICD-10-CM | POA: Diagnosis present

## 2021-03-05 DIAGNOSIS — C787 Secondary malignant neoplasm of liver and intrahepatic bile duct: Secondary | ICD-10-CM | POA: Diagnosis present

## 2021-03-05 DIAGNOSIS — Z79890 Hormone replacement therapy (postmenopausal): Secondary | ICD-10-CM

## 2021-03-05 DIAGNOSIS — E861 Hypovolemia: Secondary | ICD-10-CM | POA: Diagnosis present

## 2021-03-05 DIAGNOSIS — R6521 Severe sepsis with septic shock: Secondary | ICD-10-CM

## 2021-03-05 DIAGNOSIS — J690 Pneumonitis due to inhalation of food and vomit: Secondary | ICD-10-CM

## 2021-03-05 DIAGNOSIS — R638 Other symptoms and signs concerning food and fluid intake: Secondary | ICD-10-CM | POA: Diagnosis not present

## 2021-03-05 DIAGNOSIS — I959 Hypotension, unspecified: Secondary | ICD-10-CM | POA: Diagnosis not present

## 2021-03-05 DIAGNOSIS — R7989 Other specified abnormal findings of blood chemistry: Secondary | ICD-10-CM

## 2021-03-05 DIAGNOSIS — C799 Secondary malignant neoplasm of unspecified site: Secondary | ICD-10-CM

## 2021-03-05 DIAGNOSIS — Z90411 Acquired partial absence of pancreas: Secondary | ICD-10-CM

## 2021-03-05 DIAGNOSIS — D735 Infarction of spleen: Secondary | ICD-10-CM | POA: Diagnosis present

## 2021-03-05 DIAGNOSIS — I480 Paroxysmal atrial fibrillation: Secondary | ICD-10-CM | POA: Diagnosis present

## 2021-03-05 DIAGNOSIS — I252 Old myocardial infarction: Secondary | ICD-10-CM

## 2021-03-05 DIAGNOSIS — R778 Other specified abnormalities of plasma proteins: Secondary | ICD-10-CM | POA: Diagnosis not present

## 2021-03-05 DIAGNOSIS — Z7901 Long term (current) use of anticoagulants: Secondary | ICD-10-CM

## 2021-03-05 DIAGNOSIS — E43 Unspecified severe protein-calorie malnutrition: Secondary | ICD-10-CM | POA: Diagnosis present

## 2021-03-05 DIAGNOSIS — E860 Dehydration: Secondary | ICD-10-CM | POA: Diagnosis present

## 2021-03-05 DIAGNOSIS — G893 Neoplasm related pain (acute) (chronic): Secondary | ICD-10-CM | POA: Diagnosis present

## 2021-03-05 DIAGNOSIS — Z6825 Body mass index (BMI) 25.0-25.9, adult: Secondary | ICD-10-CM

## 2021-03-05 DIAGNOSIS — G928 Other toxic encephalopathy: Secondary | ICD-10-CM | POA: Diagnosis present

## 2021-03-05 DIAGNOSIS — Z8673 Personal history of transient ischemic attack (TIA), and cerebral infarction without residual deficits: Secondary | ICD-10-CM

## 2021-03-05 DIAGNOSIS — I420 Dilated cardiomyopathy: Secondary | ICD-10-CM | POA: Diagnosis present

## 2021-03-05 DIAGNOSIS — E785 Hyperlipidemia, unspecified: Secondary | ICD-10-CM | POA: Diagnosis present

## 2021-03-05 DIAGNOSIS — R918 Other nonspecific abnormal finding of lung field: Secondary | ICD-10-CM

## 2021-03-05 DIAGNOSIS — Z9221 Personal history of antineoplastic chemotherapy: Secondary | ICD-10-CM

## 2021-03-05 DIAGNOSIS — Z923 Personal history of irradiation: Secondary | ICD-10-CM

## 2021-03-05 DIAGNOSIS — Z515 Encounter for palliative care: Secondary | ICD-10-CM

## 2021-03-05 DIAGNOSIS — Z8249 Family history of ischemic heart disease and other diseases of the circulatory system: Secondary | ICD-10-CM

## 2021-03-05 DIAGNOSIS — Z79899 Other long term (current) drug therapy: Secondary | ICD-10-CM

## 2021-03-05 LAB — BLOOD CULTURE ID PANEL (REFLEXED) - BCID2

## 2021-03-05 LAB — GLUCOSE, CAPILLARY
Glucose-Capillary: 101 mg/dL — ABNORMAL HIGH (ref 70–99)
Glucose-Capillary: 107 mg/dL — ABNORMAL HIGH (ref 70–99)
Glucose-Capillary: 94 mg/dL (ref 70–99)

## 2021-03-05 LAB — I-STAT VENOUS BLOOD GAS, ED
Acid-Base Excess: 4 mmol/L — ABNORMAL HIGH (ref 0.0–2.0)
Bicarbonate: 25.5 mmol/L (ref 20.0–28.0)
Calcium, Ion: 0.89 mmol/L — CL (ref 1.15–1.40)
HCT: 30 % — ABNORMAL LOW (ref 39.0–52.0)
Hemoglobin: 10.2 g/dL — ABNORMAL LOW (ref 13.0–17.0)
O2 Saturation: 99 %
Potassium: 4 mmol/L (ref 3.5–5.1)
Sodium: 124 mmol/L — ABNORMAL LOW (ref 135–145)
TCO2: 26 mmol/L (ref 22–32)
pCO2, Ven: 27.9 mmHg — ABNORMAL LOW (ref 44.0–60.0)
pH, Ven: 7.569 — ABNORMAL HIGH (ref 7.250–7.430)
pO2, Ven: 113 mmHg — ABNORMAL HIGH (ref 32.0–45.0)

## 2021-03-05 LAB — CBC WITH DIFFERENTIAL/PLATELET
Abs Immature Granulocytes: 0.11 10*3/uL — ABNORMAL HIGH (ref 0.00–0.07)
Basophils Absolute: 0 10*3/uL (ref 0.0–0.1)
Basophils Relative: 0 %
Eosinophils Absolute: 0 10*3/uL (ref 0.0–0.5)
Eosinophils Relative: 0 %
HCT: 28.2 % — ABNORMAL LOW (ref 39.0–52.0)
Hemoglobin: 9.8 g/dL — ABNORMAL LOW (ref 13.0–17.0)
Immature Granulocytes: 1 %
Lymphocytes Relative: 1 %
Lymphs Abs: 0.1 10*3/uL — ABNORMAL LOW (ref 0.7–4.0)
MCH: 30.9 pg (ref 26.0–34.0)
MCHC: 34.8 g/dL (ref 30.0–36.0)
MCV: 89 fL (ref 80.0–100.0)
Monocytes Absolute: 0.1 10*3/uL (ref 0.1–1.0)
Monocytes Relative: 1 %
Neutro Abs: 14.4 10*3/uL — ABNORMAL HIGH (ref 1.7–7.7)
Neutrophils Relative %: 97 %
Platelets: UNDETERMINED 10*3/uL (ref 150–400)
RBC: 3.17 MIL/uL — ABNORMAL LOW (ref 4.22–5.81)
RDW: 18 % — ABNORMAL HIGH (ref 11.5–15.5)
WBC: 14.8 10*3/uL — ABNORMAL HIGH (ref 4.0–10.5)
nRBC: 0 % (ref 0.0–0.2)

## 2021-03-05 LAB — I-STAT CHEM 8, ED
BUN: 31 mg/dL — ABNORMAL HIGH (ref 8–23)
Calcium, Ion: 0.89 mmol/L — CL (ref 1.15–1.40)
Chloride: 92 mmol/L — ABNORMAL LOW (ref 98–111)
Creatinine, Ser: 1.4 mg/dL — ABNORMAL HIGH (ref 0.61–1.24)
Glucose, Bld: 106 mg/dL — ABNORMAL HIGH (ref 70–99)
HCT: 31 % — ABNORMAL LOW (ref 39.0–52.0)
Hemoglobin: 10.5 g/dL — ABNORMAL LOW (ref 13.0–17.0)
Potassium: 4 mmol/L (ref 3.5–5.1)
Sodium: 124 mmol/L — ABNORMAL LOW (ref 135–145)
TCO2: 24 mmol/L (ref 22–32)

## 2021-03-05 LAB — PROCALCITONIN: Procalcitonin: 12.31 ng/mL

## 2021-03-05 LAB — COMPREHENSIVE METABOLIC PANEL
ALT: 31 U/L (ref 0–44)
AST: 57 U/L — ABNORMAL HIGH (ref 15–41)
Albumin: 1.7 g/dL — ABNORMAL LOW (ref 3.5–5.0)
Alkaline Phosphatase: 429 U/L — ABNORMAL HIGH (ref 38–126)
Anion gap: 11 (ref 5–15)
BUN: 26 mg/dL — ABNORMAL HIGH (ref 8–23)
CO2: 22 mmol/L (ref 22–32)
Calcium: 7.6 mg/dL — ABNORMAL LOW (ref 8.9–10.3)
Chloride: 93 mmol/L — ABNORMAL LOW (ref 98–111)
Creatinine, Ser: 1.39 mg/dL — ABNORMAL HIGH (ref 0.61–1.24)
GFR, Estimated: 53 mL/min — ABNORMAL LOW (ref >60)
Glucose, Bld: 108 mg/dL — ABNORMAL HIGH (ref 70–99)
Potassium: 4 mmol/L (ref 3.5–5.1)
Sodium: 126 mmol/L — ABNORMAL LOW (ref 135–145)
Total Bilirubin: 3.4 mg/dL — ABNORMAL HIGH (ref 0.3–1.2)
Total Protein: 4.7 g/dL — ABNORMAL LOW (ref 6.5–8.1)

## 2021-03-05 LAB — RESP PANEL BY RT-PCR (FLU A&B, COVID) ARPGX2
Influenza A by PCR: NEGATIVE
Influenza B by PCR: NEGATIVE
SARS Coronavirus 2 by RT PCR: NEGATIVE

## 2021-03-05 LAB — TROPONIN I (HIGH SENSITIVITY)
Troponin I (High Sensitivity): 1125 ng/L (ref <18)
Troponin I (High Sensitivity): 1161 ng/L (ref <18)

## 2021-03-05 LAB — PROTIME-INR
INR: 2.4 — ABNORMAL HIGH (ref 0.8–1.2)
Prothrombin Time: 26 seconds — ABNORMAL HIGH (ref 11.4–15.2)

## 2021-03-05 LAB — BRAIN NATRIURETIC PEPTIDE: B Natriuretic Peptide: 1348.3 pg/mL — ABNORMAL HIGH (ref 0.0–100.0)

## 2021-03-05 LAB — MRSA NEXT GEN BY PCR, NASAL: MRSA by PCR Next Gen: NOT DETECTED

## 2021-03-05 LAB — LACTIC ACID, PLASMA
Lactic Acid, Venous: 1.9 mmol/L (ref 0.5–1.9)
Lactic Acid, Venous: 1.4 mmol/L (ref 0.5–1.9)

## 2021-03-05 LAB — AMMONIA: Ammonia: 16 umol/L (ref 9–35)

## 2021-03-05 LAB — LIPASE, BLOOD: Lipase: 18 U/L (ref 11–51)

## 2021-03-05 LAB — POC OCCULT BLOOD, ED: Fecal Occult Bld: NEGATIVE

## 2021-03-05 LAB — APTT: aPTT: 33 seconds (ref 24–36)

## 2021-03-05 MED ORDER — VANCOMYCIN HCL 1500 MG/300ML IV SOLN
1500.0000 mg | Freq: Once | INTRAVENOUS | Status: AC
  Administered 2021-03-05: 1500 mg via INTRAVENOUS
  Filled 2021-03-05: qty 300

## 2021-03-05 MED ORDER — FENTANYL CITRATE (PF) 100 MCG/2ML IJ SOLN
25.0000 ug | INTRAMUSCULAR | Status: DC | PRN
Start: 2021-03-05 — End: 2021-03-07
  Administered 2021-03-05: 25 ug via INTRAVENOUS
  Filled 2021-03-05 (×2): qty 2

## 2021-03-05 MED ORDER — METRONIDAZOLE 500 MG/100ML IV SOLN
500.0000 mg | Freq: Once | INTRAVENOUS | Status: AC
  Administered 2021-03-05: 500 mg via INTRAVENOUS
  Filled 2021-03-05: qty 100

## 2021-03-05 MED ORDER — NOREPINEPHRINE 4 MG/250ML-% IV SOLN
0.0000 ug/min | INTRAVENOUS | Status: DC
  Administered 2021-03-05: 2 ug/min via INTRAVENOUS
  Administered 2021-03-06: 7 ug/min via INTRAVENOUS
  Administered 2021-03-06: 3 ug/min via INTRAVENOUS
  Filled 2021-03-05 (×4): qty 250

## 2021-03-05 MED ORDER — SODIUM CHLORIDE 0.9 % IV SOLN
2.0000 g | Freq: Two times a day (BID) | INTRAVENOUS | Status: DC
  Administered 2021-03-05 – 2021-03-06 (×3): 2 g via INTRAVENOUS
  Filled 2021-03-05 (×4): qty 2

## 2021-03-05 MED ORDER — SODIUM CHLORIDE 0.9 % IV SOLN
2.0000 g | Freq: Once | INTRAVENOUS | Status: AC
  Administered 2021-03-05: 2 g via INTRAVENOUS
  Filled 2021-03-05: qty 2

## 2021-03-05 MED ORDER — PROCHLORPERAZINE EDISYLATE 10 MG/2ML IJ SOLN
10.0000 mg | Freq: Four times a day (QID) | INTRAMUSCULAR | Status: DC | PRN
  Filled 2021-03-05: qty 2

## 2021-03-05 MED ORDER — VANCOMYCIN HCL 1000 MG/200ML IV SOLN
1000.0000 mg | INTRAVENOUS | Status: DC
  Filled 2021-03-05: qty 200

## 2021-03-05 MED ORDER — LACTATED RINGERS IV SOLN: INTRAVENOUS | Status: AC

## 2021-03-05 MED ORDER — IOHEXOL 350 MG/ML SOLN
100.0000 mL | Freq: Once | INTRAVENOUS | Status: AC | PRN
  Administered 2021-03-05: 100 mL via INTRAVENOUS

## 2021-03-05 MED ORDER — LACTATED RINGERS IV BOLUS (SEPSIS)
2000.0000 mL | Freq: Once | INTRAVENOUS | Status: AC
  Administered 2021-03-05: 2000 mL via INTRAVENOUS

## 2021-03-05 MED ORDER — CHLORHEXIDINE GLUCONATE CLOTH 2 % EX PADS
6.0000 | MEDICATED_PAD | Freq: Every day | CUTANEOUS | Status: DC
  Administered 2021-03-05 – 2021-03-08 (×4): 6 via TOPICAL

## 2021-03-05 MED ORDER — SODIUM CHLORIDE 0.9 % IV SOLN: INTRAVENOUS | Status: DC | PRN

## 2021-03-05 NOTE — ED Notes (Signed)
ED TO INPATIENT HANDOFF REPORT  ED Nurse Name and Phone #: Isaiah Flattery, RN [phone]  S Name/Age/Gender Isaiah Patterson 75 y.o. male Room/Bed: 021C/021C  Code Status   Code Status: DNR  Home/SNF/Other Home Patient oriented to: self, place, time, and situation Is this baseline? Yes   Triage Complete: Triage complete  Chief Complaint Hypotension [I95.9]  Triage Note Pt BIB [facility] from home c/o weakness. Pt has a hx of liver cancer and went from treatment Wednesday but facility did not give to pt. Pt has been at home sitting on the couch ever since. Per family pt is normally A&Ox4. Pt is only alert to self at this time. Pt normally walks with no issues. Pt slide to the floor this morning from the couch and was unable to get up. Per EMS when they got there pt was hypotensive BP 90/40. Pt given a 500 NS bolus by EMS.   90/40 Confused  134 CBG 89% 90 HR A-fib with hx  100 Temp   18g Left forearm  500 NS   Allergies Allergies  Allergen Reactions   Shellfish Allergy Hives   Trazodone And Nefazodone Other (See Comments)    Unknown    Level of Care/Admitting Diagnosis ED Disposition     ED Disposition  Admit   Condition  --   Comment  Hospital Area: [facility] [[id number]]  Level of Care: ICU [6]  May admit patient to [facility] or [facility] if equivalent level of care is available:: Yes  Covid Evaluation: Confirmed COVID Negative  Diagnosis: Hypotension [[id number]]  Admitting Physician: Spero Geralds [[id number]]  Attending Physician: Spero Geralds [[id number]]  Estimated length of stay: 3 - 4 days  Certification:: I certify this patient will need inpatient services for at least 2 midnights          B Medical/Surgery History Past Medical History:  Diagnosis Date   Arthritis    "hands, fingers" (02/03/2013)   Blood dyscrasia    "told I was a free bleeder when I was young; my blood does have trouble clotting" (02/03/2013)   Cancer ([place])    Coronary artery  disease    s/p CABG   GERD (gastroesophageal reflux disease)    Hyperlipidemia    Hypertension    Myocardial infarct ([place]) 2000   Pneumonia    "couple times" (02/03/2013)   Past Surgical History:  Procedure Laterality Date   BILIARY DILATION  04/02/2019   Procedure: BILIARY DILATION;  Surgeon: Arta Silence, MD;  Location: [place];  Service: Endoscopy;;   BILIARY STENT PLACEMENT  04/02/2019   Procedure: BILIARY STENT PLACEMENT;  Surgeon: Arta Silence, MD;  Location: [place];  Service: Endoscopy;;   CORONARY ARTERY BYPASS GRAFT  2000   "CABG X5" (02/03/2013)   ERCP N/A 04/02/2019   Procedure: ENDOSCOPIC RETROGRADE CHOLANGIOPANCREATOGRAPHY (ERCP);  Surgeon: Arta Silence, MD;  Location: [facility] ENDOSCOPY;  Service: Endoscopy;  Laterality: N/A;   ESOPHAGOGASTRODUODENOSCOPY (EGD) WITH PROPOFOL N/A 04/02/2019   Procedure: ESOPHAGOGASTRODUODENOSCOPY (EGD) WITH PROPOFOL;  Surgeon: Arta Silence, MD;  Location: [place];  Service: Endoscopy;  Laterality: N/A;   EUS N/A 04/02/2019   Procedure: UPPER ENDOSCOPIC ULTRASOUND (EUS) LINEAR;  Surgeon: Arta Silence, MD;  Location: [place];  Service: Endoscopy;  Laterality: N/A;   FINE NEEDLE ASPIRATION  04/02/2019   Procedure: FINE NEEDLE ASPIRATION (FNA) LINEAR;  Surgeon: Arta Silence, MD;  Location: [place];  Service: Endoscopy;;   LEFT HEART CATH AND CORS/GRAFTS ANGIOGRAPHY N/A 06/02/2020  Procedure: LEFT HEART CATH AND CORS/GRAFTS ANGIOGRAPHY;  Surgeon: Nelva Bush, MD;  Location: [place] [facility];  Service: Cardiovascular;  Laterality: N/A;   LEFT HEART CATHETERIZATION WITH CORONARY/GRAFT ANGIOGRAM N/A 02/04/2013   Procedure: LEFT HEART CATHETERIZATION WITH Beatrix Fetters;  Surgeon: Candee Furbish, MD;  Location: [facility] CATH LAB;  Service: Cardiovascular;  Laterality: N/A;   PANCREATIC STENT PLACEMENT  04/02/2019   Procedure: PANCREATIC STENT PLACEMENT;  Surgeon: Arta Silence, MD;  Location: [facility] ENDOSCOPY;   Service: Endoscopy;;   SPHINCTEROTOMY  04/02/2019   Procedure: Joan Mayans;  Surgeon: Arta Silence, MD;  Location: [facility] END[facility]COPY;  Service: Endoscopy;;   TONSILLECTOMY  1950's     A IV Location/Drains/Wounds Patient Lines/Drains/Airways Status     Active Line/Drains/Airways     Name Placement date Placement time Site Days   Implanted Port Right Chest --  --  Chest  --   Peripheral IV 06/15/20 Left Antecubital 06/15/20  1040  Antecubital  263   Peripheral IV 03/05/21 18 G Anterior;Left Forearm 03/05/21  0641  Forearm  less than 1   GI Stent 5 Fr. 04/02/19  1142  --  703   GI Stent 10 Fr. 04/02/19  1213  --  703            Intake/Output Last 24 hours  Intake/Output Summary (Last 24 hours) at 03/05/2021 1500 Last data filed at 03/05/2021 1054 Gross per 24 hour  Intake 1468.76 ml  Output --  Net 1468.76 ml    Labs/Imaging Results for orders placed or performed during the hospital encounter of 03/05/21 (from the past 48 hour(s))  Resp Panel by RT-PCR (Flu A&B, Covid) Nasopharyngeal Swab     Status: None   Collection Time: 03/05/21  6:47 AM   Specimen: Nasopharyngeal Swab; Nasopharyngeal(NP) swabs in vial transport medium  Result Value Ref Range   SARS Coronavirus 2 by RT PCR NEGATIVE NEGATIVE    Comment: (NOTE) SARS-CoV-2 target nucleic acids are NOT DETECTED.  The SARS-CoV-2 RNA is generally detectable in upper respiratory specimens during the acute phase of infection. The lowest concentration of SARS-CoV-2 viral copies this assay can detect is 138 copies/mL. A negative result does not preclude SARS-Cov-2 infection and should not be used as the sole basis for treatment or other patient management decisions. A negative result may occur with  improper specimen collection/handling, submission of specimen other than nasopharyngeal swab, presence of viral mutation(s) within the areas targeted by this assay, and inadequate number of viral copies(<138 copies/mL). A  negative result must be combined with clinical observations, patient history, and epidemiological information. The expected result is Negative.  Fact Sheet for Patients:  EntrepreneurPulse.com.au  Fact Sheet for Healthcare Providers:  IncredibleEmployment.be  This test is no t yet approved or cleared by the [place] FDA and  has been authorized for detection and/or diagnosis of SARS-CoV-2 by FDA under an Emergency Use Authorization (EUA). This EUA will remain  in effect (meaning this test can be used) for the duration of the COVID-19 declaration under Section 564(b)(1) of the Act, 21 U.S.C.section 360bbb-3(b)(1), unless the authorization is terminated  or revoked sooner.       Influenza A by PCR NEGATIVE NEGATIVE   Influenza B by PCR NEGATIVE NEGATIVE    Comment: (NOTE) The Xpert Xpress SARS-CoV-2/FLU/RSV plus assay is intended as an aid in the diagnosis of influenza from Nasopharyngeal swab specimens and should not be used as a sole basis for treatment. Nasal washings and aspirates are unacceptable for Xpert Xpress SARS-CoV-2/FLU/RSV  testing.  Fact Sheet for Patients: EntrepreneurPulse.com.au  Fact Sheet for Healthcare Providers: IncredibleEmployment.be  This test is not yet approved or cleared by the [place] FDA and has been authorized for detection and/or diagnosis of SARS-CoV-2 by FDA under an Emergency Use Authorization (EUA). This EUA will remain in effect (meaning this test can be used) for the duration of the COVID-19 declaration under Section 564(b)(1) of the Act, 21 U.S.C. section 360bbb-3(b)(1), unless the authorization is terminated or revoked.  Performed at [facility], [place] [address] [place], [place] [address]   Lactic acid, plasma     Status: None   Collection Time: 03/05/21  6:47 AM  Result Value Ref Range   Lactic Acid, Venous 1.9 0.5 - 1.9 mmol/L    Comment:  Performed at [facility] [address] [place], [place] [address]  Comprehensive metabolic panel     Status: Abnormal   Collection Time: 03/05/21  6:47 AM  Result Value Ref Range   Sodium 126 (L) 135 - 145 mmol/L   Potassium 4.0 3.5 - 5.1 mmol/L   Chloride 93 (L) 98 - 111 mmol/L   CO2 22 22 - 32 mmol/L   Glucose, Bld 108 (H) 70 - 99 mg/dL    Comment: Glucose reference range applies only to samples taken after fasting for at least 8 hours.   BUN 26 (H) 8 - 23 mg/dL   Creatinine, Ser 1.39 (H) 0.61 - 1.24 mg/dL   Calcium 7.6 (L) 8.9 - 10.3 mg/dL   Total Protein 4.7 (L) 6.5 - 8.1 g/dL   Albumin 1.7 (L) 3.5 - 5.0 g/dL   AST 57 (H) 15 - 41 U/L   ALT 31 0 - 44 U/L   Alkaline Phosphatase 429 (H) 38 - 126 U/L   Total Bilirubin 3.4 (H) 0.3 - 1.2 mg/dL   GFR, Estimated 53 (L) >60 mL/min    Comment: (NOTE) Calculated using the CKD-EPI Creatinine Equation (2021)    Anion gap 11 5 - 15    Comment: Performed at [facility], [place] [address], [place], [place] [address]  CBC WITH DIFFERENTIAL     Status: Abnormal   Collection Time: 03/05/21  6:47 AM  Result Value Ref Range   WBC 14.8 (H) 4.0 - 10.5 K/uL   RBC 3.17 (L) 4.22 - 5.81 MIL/uL   Hemoglobin 9.8 (L) 13.0 - 17.0 g/dL   HCT 28.2 (L) 39.0 - 52.0 %   MCV 89.0 80.0 - 100.0 fL   MCH 30.9 26.0 - 34.0 pg   MCHC 34.8 30.0 - 36.0 g/dL   RDW 18.0 (H) 11.5 - 15.5 %   Platelets PLATELET CLUMPS NOTED ON SMEAR, UNABLE TO ESTIMATE 150 - 400 K/uL   nRBC 0.0 0.0 - 0.2 %   Neutrophils Relative % 97 %   Neutro Abs 14.4 (H) 1.7 - 7.7 K/uL   Lymphocytes Relative 1 %   Lymphs Abs 0.1 (L) 0.7 - 4.0 K/uL   Monocytes Relative 1 %   Monocytes Absolute 0.1 0.1 - 1.0 K/uL   Eosinophils Relative 0 %   Eosinophils Absolute 0.0 0.0 - 0.5 K/uL   Basophils Relative 0 %   Basophils Absolute 0.0 0.0 - 0.1 K/uL   Immature Granulocytes 1 %   Abs Immature Granulocytes 0.11 (H) 0.00 - 0.07 K/uL    Comment: Performed at [facility] [address], [place], [place] [address]  Protime-INR     Status: Abnormal  Collection Time: 03/05/21  6:47 AM  Result Value Ref Range   Prothrombin Time 26.0 (H) 11.4 - 15.2 seconds   INR 2.4 (H) 0.8 - 1.2    Comment: (NOTE) INR goal varies based on device and disease states. Performed at [facility], [place] [address], [place], [place] [address]   APTT     Status: None   Collection Time: 03/05/21  6:47 AM  Result Value Ref Range   aPTT 33 24 - 36 seconds    Comment: Performed at [facility] [address], [place], [place] [address]  Troponin I (High Sensitivity)     Status: Abnormal   Collection Time: 03/05/21  6:47 AM  Result Value Ref Range   Troponin I (High Sensitivity) 1,125 (HH) <18 ng/L    Comment: CRITICAL RESULT CALLED TO, READ BACK BY AND VERIFIED WITH: T WALKER RN BY SSTEPHENS [phone] [id number] (NOTE) Elevated high sensitivity troponin I (hsTnI) values and significant  changes across serial measurements may suggest ACS but many other  chronic and acute conditions are known to elevate hsTnI results.  Refer to the Links section for chest pain algorithms and additional  guidance. Performed at [facility], [place] [address], [place], [place] [address]   Lipase, blood     Status: None   Collection Time: 03/05/21  6:47 AM  Result Value Ref Range   Lipase 18 11 - 51 U/L    Comment: Performed at [facility] [address], [place], [place] [address]  Brain natriuretic peptide     Status: Abnormal   Collection Time: 03/05/21  6:48 AM  Result Value Ref Range   B Natriuretic Peptide 1,348.3 (H) 0.0 - 100.0 pg/mL    Comment: Performed at [facility] [address] [place], [place] [address]  POC occult blood, ED     Status: None   Collection Time: 03/05/21  7:29 AM  Result Value Ref Range   Fecal Occult Bld NEGATIVE NEGATIVE  Ammonia     Status: None   Collection Time: 03/05/21  7:44 AM  Result Value Ref Range   Ammonia 16 9 - 35 umol/L    Comment:  Performed at [facility], [place] [address], [place], [place] [address]  I-stat chem 8, ED (not at [facility] or [facility])     Status: Abnormal   Collection Time: 03/05/21  8:20 AM  Result Value Ref Range   Sodium 124 (L) 135 - 145 mmol/L   Potassium 4.0 3.5 - 5.1 mmol/L   Chloride 92 (L) 98 - 111 mmol/L   BUN 31 (H) 8 - 23 mg/dL   Creatinine, Ser 1.40 (H) 0.61 - 1.24 mg/dL   Glucose, Bld 106 (H) 70 - 99 mg/dL    Comment: Glucose reference range applies only to samples taken after fasting for at least 8 hours.   Calcium, Ion 0.89 (LL) 1.15 - 1.40 mmol/L   TCO2 24 22 - 32 mmol/L   Hemoglobin 10.5 (L) 13.0 - 17.0 g/dL   HCT 31.0 (L) 39.0 - 52.0 %   Comment NOTIFIED PHYSICIAN   I-Stat venous blood gas, [facility] ED)     Status: Abnormal   Collection Time: 03/05/21  8:20 AM  Result Value Ref Range   pH, Ven 7.569 (H) 7.250 - 7.430   pCO2, Ven 27.9 (L) 44.0 - 60.0 mmHg   pO2, Ven 113.0 (H) 32.0 - 45.0 mmHg   Bicarbonate 25.5 20.0 -  28.0 mmol/L   TCO2 26 22 - 32 mmol/L   O2 Saturation 99.0 %   Acid-Base Excess 4.0 (H) 0.0 - 2.0 mmol/L   Sodium 124 (L) 135 - 145 mmol/L   Potassium 4.0 3.5 - 5.1 mmol/L   Calcium, Ion 0.89 (LL) 1.15 - 1.40 mmol/L   HCT 30.0 (L) 39.0 - 52.0 %   Hemoglobin 10.2 (L) 13.0 - 17.0 g/dL   Sample type VENOUS    Comment NOTIFIED PHYSICIAN   Lactic acid, plasma     Status: None   Collection Time: 03/05/21  8:47 AM  Result Value Ref Range   Lactic Acid, Venous 1.4 0.5 - 1.9 mmol/L    Comment: Performed at [facility], [place] [address] [place], [place] [address]  Troponin I (High Sensitivity)     Status: Abnormal   Collection Time: 03/05/21  8:48 AM  Result Value Ref Range   Troponin I (High Sensitivity) 1,161 (HH) <18 ng/L    Comment: CRITICAL VALUE NOTED.  VALUE IS CONSISTENT WITH PREVIOUSLY REPORTED AND CALLED VALUE. (NOTE) Elevated high sensitivity troponin I (hsTnI) values and significant  changes across serial measurements may suggest ACS but many other   chronic and acute conditions are known to elevate hsTnI results.  Refer to the Links section for chest pain algorithms and additional  guidance. Performed at [facility], [place] [address] [place], [place] [address]   Procalcitonin - Baseline     Status: None   Collection Time: 03/05/21  8:48 AM  Result Value Ref Range   Procalcitonin 12.31 ng/mL    Comment:        Interpretation: PCT >= 10 ng/mL: Important systemic inflammatory response, almost exclusively due to severe bacterial sepsis or septic shock. (NOTE)       Sepsis PCT Algorithm           Lower Respiratory Tract                                      Infection PCT Algorithm    ----------------------------     ----------------------------         PCT < 0.25 ng/mL                PCT < 0.10 ng/mL          Strongly encourage             Strongly discourage   discontinuation of antibiotics    initiation of antibiotics    ----------------------------     -----------------------------       PCT 0.25 - 0.50 ng/mL            PCT 0.10 - 0.25 ng/mL               OR       >80% decrease in PCT            Discourage initiation of                                            antibiotics      Encourage discontinuation           of antibiotics    ----------------------------     -----------------------------         PCT >= 0.50  ng/mL              PCT 0.26 - 0.50 ng/mL                AND       <80% decrease in PCT             Encourage initiation of                                             antibiotics       Encourage continuation           of antibiotics    ----------------------------     -----------------------------        PCT >= 0.50 ng/mL                  PCT > 0.50 ng/mL               AND         increase in PCT                  Strongly encourage                                      initiation of antibiotics    Strongly encourage escalation           of antibiotics                                      -----------------------------                                           PCT <= 0.25 ng/mL                                                 OR                                        > 80% decrease in PCT                                      Discontinue / Do not initiate                                             antibiotics  Performed at [facility], [address] [place], [place] [address]    CT HEAD WO CONTRAST (5MM)  Result Date: 03/05/2021 CLINICAL DATA:  Delirium. EXAM: CT HEAD WITHOUT CONTRAST TECHNIQUE: Contiguous axial images were obtained from the base of the skull through the vertex without intravenous contrast. COMPARISON:  12/26/2019 FINDINGS: Brain: No evidence of acute infarction, hemorrhage, hydrocephalus, extra-axial collection,  or mass lesion/mass effect. Mild generalized cerebral atrophy and chronic small vessel disease show no significant change. Vascular:  No hyperdense vessel or other acute findings. Skull: No evidence of fracture or other significant bone abnormality. Sinuses/Orbits:  No acute findings. Other: None. IMPRESSION: No acute intracranial abnormality. Stable mild cerebral atrophy and chronic small vessel disease. Electronically Signed   By: Marlaine Hind M.D.   On: 03/05/2021 10:42   CT Angio Chest PE W and/or Wo Contrast  Result Date: 03/05/2021 CLINICAL DATA:  Sepsis. Nausea, vomiting, shortness of breath and chest pain. History of liver cancer. EXAM: CT ANGIOGRAPHY CHEST WITH CONTRAST TECHNIQUE: Multidetector CT imaging of the chest was performed using the standard protocol during bolus administration of intravenous contrast. Multiplanar CT image reconstructions and MIPs were obtained to evaluate the vascular anatomy. CONTRAST:  13mL OMNIPAQUE IOHEXOL 350 MG/ML SOLN COMPARISON:  CT chest 06/01/2020 FINDINGS: Cardiovascular: Satisfactory opacification of the pulmonary arteries to the segmental level. No evidence of pulmonary embolism. Enlarged left heart,  stable compared to 06/01/2020. No pericardial effusion. Coronary artery stents and changes of coronary artery bypass graft. Mediastinum/Nodes: No enlarged mediastinal, hilar, or axillary lymph nodes. Punctate calcification noted in the left thyroid. The trachea and esophagus demonstrate no significant findings. Lungs/Pleura: Small right and small-moderate left pleural effusions with adjacent compressive atelectasis in the lower lobes. Subsegmental atelectasis versus scarring in the lingula. Multiple new subpleural nodules are noted in the right upper and middle lobes including: - 0.3 cm solid nodule in the right upper lobe (series 4, image 97) - 0.5 cm subsolid nodule in the right middle lobe (series 4, image 122) - 0.3 cm solid nodule in the right middle lobe (series 4, image 124) - 0.4 cm solid nodule in the right middle lobe (series 4, image 137 Upper Abdomen: Partially visualized hypodense hepatic masses, consistent with history of liver cancer. At least moderate volume of ascites is seen within the upper abdomen. No free intraperitoneal air in the visualized upper abdomen. Please see today's dedicated CT abdomen pelvis report for further details. Musculoskeletal: Interval vertebral body augmentation of the T7 and T8 compression fractures. Chronic deformity of the right humeral head. No acute osseous abnormality. No suspicious osseous lesion. Review of the MIP images confirms the above findings. IMPRESSION: 1. No pulmonary embolism. 2. Small right and small-moderate left pleural effusions with adjacent compressive atelectasis in the lower lobes. 3. Multiple new solid and sub-solid subpleural nodules in the right upper and middle lobes. Favor infectious versus inflammatory etiology, though in the setting of known malignancy, findings are indeterminate. Recommend attention on follow-up imaging. 4. Interval vertebral body augmentation of the T7 and T8 compression fractures. 5. Ascites and multiple hepatic masses  seen in the visualized upper abdomen. Please see today's dedicated CT abdomen and pelvis report for further details. Electronically Signed   By: Ileana Roup M.D.   On: 03/05/2021 11:04   CT ABDOMEN PELVIS W CONTRAST  Result Date: 03/05/2021 CLINICAL DATA:  History of pancreatic adenocarcinoma status post Whipple procedure. Nausea and vomiting. Sepsis. EXAM: CT ABDOMEN AND PELVIS WITH CONTRAST TECHNIQUE: Multidetector CT imaging of the abdomen and pelvis was performed using the standard protocol following bolus administration of intravenous contrast. CONTRAST:  154mL OMNIPAQUE IOHEXOL 350 MG/ML SOLN COMPARISON:  CT abdomen pelvis 05/25/2020 CT abdomen pelvis 03/31/2019 FINDINGS: Lower chest: Partially visualized bilateral pleural effusions with compressive atelectasis in the lower lobes. Enlarged left heart. Postoperative changes of CABG. Please see today's dedicated CT chest report for further details. Hepatobiliary:  A conglomerate of multi-septated hypodense masses occupies the majority of the left lobe of the liver spanning approximately 7 cm transverse x 8.7 cm AP x 10.6 cm craniocaudal dimension (series 6, image 29; series 10, image 59). An additional ill-defined hypodensity spanning 2.4 x 2.4 x 2.4 cm is noted in the hepatic segment V (series 6, image 27; series 10, image 38). Trace pneumobilia, consistent with history of Whipple procedure. Cholecystectomy. Pancreas: Postoperative changes of Whipple. There is moderate parenchymal atrophy in the pancreatic body and tail. The main pancreatic duct is not dilated. Spleen: Borderline enlarged, measuring 12.6 cm in length. There are several small wedge-shaped areas of hypoenhancement at the superior and posterior aspects of the spleen, new from prior exam 05/25/2020 (series 6 images 11, 18, 21). Adrenals/Urinary Tract: Adrenal glands are unremarkable. Kidneys are normal, without renal calculi, focal lesion, or hydronephrosis. Bladder is unremarkable.  Stomach/Bowel: Postoperative changes of Whipple procedure. Appendix appears normal. No bowel wall thickening or obstruction. Vascular/Lymphatic: An ill-defined 7.3 cm linear hypodensity spanning segment VI (series 6, image 28; series 9, image 55) likely represents the occluded posterior branch of the right portal vein. The left portal vein is patent but decreased in caliber compared to prior exam 05/25/2020. There is a focal narrowing of the main portal vein at the junction with the splenic vein which is new compared to 05/25/2020. SMV and splenic vein are patent. Vague soft tissue density surrounding the common hepatic artery and junction of the main portal vein and splenic vein (series 6, images 29, 30, 32). Reproductive: Prostate is enlarged with mass effect on the inferior wall of the urinary bladder. Other: Large volume simple abdominopelvic ascites. No free intra-abdominal air. Musculoskeletal: Stable compression fracture of the T12 vertebral body with approximately 50% vertebral body height loss. Advanced degenerative disc disease at L3-L4 with vacuum disc phenomenon. No acute or suspicious osseous finding. IMPRESSION: 1. A conglomerate of multi-septated hypodense masses occupies nearly the entirety of the left lobe of the liver. In the setting of sepsis, findings are concerning for hepatic abscesses. 2. Focal severe narrowing of the main portal vein at the confluence with the splenic vein and SMV. Vague soft tissue at the focal area of narrowing as well as surrounding the common hepatic artery is concerning for recurrent malignancy. 3. 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 4. Multiple small wedge-shaped infarcts are noted in the superior and posterior aspects of the spleen. 5. Large volume simple abdominopelvic ascites. RAD These results were called by telephone at the time of interpretation on 03/05/2021 at 11:49 am to provider [facility] , who verbally acknowledged these  results. Electronically Signed   By: Ileana Roup M.D.   On: 03/05/2021 11:51   DG Chest Port 1 View  Result Date: 03/05/2021 CLINICAL DATA:  75 year old male with possible sepsis. "Liver cancer." EXAM: PORTABLE CHEST 1 VIEW COMPARISON:  Portable chest 06/02/2020 and earlier. FINDINGS: Portable AP semi upright view at 0657 hours. Stable right chest Port-A-Cath. Stable mild cardiomegaly and mediastinal contours with prior CABG. Visualized tracheal air column is within normal limits. No pneumothorax or pulmonary edema. Lower lung volumes with patchy and veiling lung base opacity greater on the left. No air bronchograms. Negative visible bowel gas. Upper abdominal surgical clips. Chronic left 6th rib fracture. No acute osseous abnormality identified. IMPRESSION: Lower lung volumes with patchy and veiling lung base opacity greater on the left. Consider small pleural effusions with atelectasis. Lung base pneumonia not excluded. Electronically Signed  By: Genevie Ann M.D.   On: 03/05/2021 07:13    Pending Labs Unresulted Labs (From admission, onward)     Start     Ordered   03/06/21 0500  Procalcitonin  Daily,   R      03/05/21 1308   03/06/21 0500  Comprehensive metabolic panel  Tomorrow morning,   R        03/05/21 1405   03/06/21 0500  Protime-INR  Tomorrow morning,   R        03/05/21 1405   03/06/21 0500  CBC  Tomorrow morning,   R        03/05/21 1405   03/05/21 0917  MRSA Next Gen by PCR, Nasal  Once,   STAT        03/05/21 0916   03/05/21 0647  Blood Culture (routine x 2)  (Septic presentation on arrival (screening labs, nursing and treatment orders for obvious sepsis))  BLOOD CULTURE X 2,   STAT      03/05/21 0648   03/05/21 0647  Urinalysis, Routine w reflex microscopic  (Septic presentation on arrival (screening labs, nursing and treatment orders for obvious sepsis))  ONCE - STAT,   STAT        03/05/21 0648   03/05/21 0647  Urine Culture  (Septic presentation on arrival (screening labs,  nursing and treatment orders for obvious sepsis))  ONCE - STAT,   STAT       Question:  Indication  Answer:  Sepsis   03/05/21 0648            Vitals/Pain Today's Vitals   03/05/21 1330 03/05/21 1345 03/05/21 1400 03/05/21 1415  BP: 97/61 110/70 116/65 116/69  Pulse: (!) 53 (!) 55 (!) 59 (!) 55  Resp: 14 19 (!) 23 18  Temp:      TempSrc:      SpO2: 98% 98% 97% 97%  Weight:      PainSc:        Isolation Precautions No active isolations  Medications Medications  lactated ringers infusion (0 mLs Intravenous Stopped 03/05/21 0730)  vancomycin (VANCOREADY) IVPB 1000 mg/200 mL (has no administration in time range)  ceFEPIme (MAXIPIME) 2 g in sodium chloride 0.9 % 100 mL IVPB (has no administration in time range)  norepinephrine (LEVOPHED) 4mg  in 235mL premix infusion (2 mcg/min Intravenous New Bag/Given 03/05/21 1244)  lactated ringers bolus 2,000 mL (0 mLs Intravenous Stopped 03/05/21 0918)  ceFEPIme (MAXIPIME) 2 g in sodium chloride 0.9 % 100 mL IVPB (0 g Intravenous Stopped 03/05/21 0744)  metroNIDAZOLE (FLAGYL) IVPB 500 mg (0 mg Intravenous Stopped 03/05/21 0852)  vancomycin (VANCOREADY) IVPB 1500 mg/300 mL (0 mg Intravenous Stopped 03/05/21 1054)  iohexol (OMNIPAQUE) 350 MG/ML injection 100 mL (100 mLs Intravenous Contrast Given 03/05/21 1007)    Mobility walks High fall risk   Focused Assessments Cardiac Assessment Handoff:    Lab Results  Component Value Date   TROPONINI <0.30 02/04/2013   No results found for: DDIMER Does the Patient currently have chest pain? Yes    R Recommendations: See Admitting Provider Note  Report given to:   Additional Notes:

## 2021-03-05 NOTE — H&P (Addendum)
NAME:  Isaiah Patterson, MRN:  [id number], DOB:  [birth year], LOS: 0 ADMISSION DATE:  03/05/2021, CONSULTATION DATE:  03/05/2021 REFERRING MD:  Dr. Ronnald Nian, CHIEF COMPLAINT:  weakness   History of Present Illness:  75 year old male with PMH as below, significant for but not limited to metastatic adenocarcinoma of pancreas stage IV and combined HF.    He is currently being treated at the [facility] for his metastatic pancreatic cancer.  Currently on palliative chemotherapy with gemcitabine and paclitaxel, however has not had chemo in the last month due to weakness and low blood counts.    Most recently, he was admitted at [facility] in October with afib with RVR, decompensated HF with EF <20%, and anemia found to have new RLE DVT, changed from DAPT to Eliquis.   Most of information provided by patient's fiance, [name] at bedside as he sleeps.  They have been together for 10 years and they live in [place].  He has two daughters, one lives locally.  [name] states that since hospital discharge, he has not been the same.   He sleeps most of the time, poor appetite with poor PO intake.  Generalized weakness, which has progressed to him sliding off the couch on Thursday and again this morning to where she could not get him up having to call EMS.  He has become so weak, he often coughs and chokes on food or drink.  He has not complained of any pain.  Noticed some confusion today.  Has had a cough, sometimes productive, and had an episode of chills last night with two possible low grade temps.  His blood pressure has been running lower, in which his hypertensive medications were recently decreased.  Denies any vomiting, diarrhea, or SOB.    In ER, afebrile 98.8, BP 87/56, NSR 84, rr 20, and 93% on room air.  Labs noted for WBC 14.8, Hgb 9.8, INR 2.4, BNP 1348, trop hs 1161, normal lactate 1.4, ammonia 16, Na 126, CL 93, BUN 26, sCr 1.39, albumin 1.7, AST 57, alk phos 429, t. Bili 3.4, protein 4.7, SARS/ flu negative.  ABG  7.569/ 27.9/ 113/ 25.5.   CXR with low lung volumes, patchy opacities, L>R, possible small effusions with atelectasis.  CTA chest, CT head, and CT abd/ pelvis imaging as below-  possible concerning for hepatic abscess in the setting of sepsis vs advanced malignancy.  EKG with NSR, prolonged Qtc (560), and no acute changes.  Empirically started on cefepime, flagyl, and vancomycin.  Given 2 LR bolus with some improvement in blood pressure but since has been started on low dose levophed.  Cardiology consulted given positive troponin and elevated BNP.  Pending surgery, IR, and palliative care consult by EDP.  PCCM asked to admit.   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% (05/2020) - followed at [facility] SVT/ MAT HTN HLD PAF and RLE DVT on Eliquis  Metastatic adenocarcinoma of pancreas stage IV (dx 03/2019) s/p whipple (05/2019) and radiation, currently on palliative chemo (s/p Folfirnox 01/2020, currently on gemcitabine and paclitaxel)- [facility] at [facility] Right chest port  GERD Former tobacco abuse   Significant Hospital Events: Including procedures, antibiotic start and stop dates in addition to other pertinent events   11/6 admitted with hypotension, suspected septic shock  11/6 Bcx 2 > 11/6 UC > 11/6 MRSA PCR > 11/6 SARS/ flu  > neg  CTH 11/6 > mild cerebral atrophy and  chronic small vessel disease, otherwise non acute  CTA PE 11/6 > neg for PE, small right and small to moderate left pleural effusions with compressive atelectasis, multiple new solid and sub-solid subpleural nodules in RUL and RML, T7 and T8 compression fractures, ascites and multiple hepatic masses (see CT a/p)  CT abd/pelvis 11/6 > Conglomerate of multi-septated hypodense masses occupying nearly the entirety of left lobe liver, may be concerning for hepatic abscesses in the setting of sepsis.  Focal severe narrowing of main portal vein at the confluence of the splenic vein and  SMV.  Vague soft tissue at the focal area of narrowing as well as surrounding the common hepatic artery concerning for recurrent malignany.  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  Multiple small wedge-shaped infarcts in the superior and posterior aspects of spleen.  Large volume simple abdominopelvic ascites.   Interim History / Subjective:  NE at 2 mcg/min via port   Objective   Blood pressure 90/61, pulse (!) 57, temperature 98.8 F (37.1 C), temperature source Oral, resp. rate 20, weight 73 kg, SpO2 96 %.        Intake/Output Summary (Last 24 hours) at 03/05/2021 1215 Last data filed at 03/05/2021 1054 Gross per 24 hour  Intake 1468.76 ml  Output --  Net 1468.76 ml   Filed Weights   03/05/21 0745  Weight: 73 kg   Examination: General:  Thin frail, chronically ill appearing elderly male sleeping on ER in NAD HEENT: MM pink/dry, pupils 3/reactive Neuro: wakes to verbal, oriented to person, place, slightly confused on time, MAE, generalized weakness CV: rr, SB in the 50's, distally warm/ perfused, right chest port accessed PULM:  non labored, clear, shallow/ diminished in bases GI: soft, bs+, NT, +fluid wave Extremities: warm/dry, no LE edema  Skin: no rashes   Resolved Hospital Problem list    Assessment & Plan:   Hypotension- question if related to sepsis with questionable hepatic abscess vs advanced malignancy +/- poor PO intake/ dehydration vs less likely cardiogenic shock  - Surgery/ IR consulted by ER, pending eval/ input > possible hepatic abscess 2/2 metastasis, plans to hold eliquis and IR possibly drain  - cardiology input as below - lactate normal - trend PCT - continue vancomycin, cefepime, and flagyl for now - Levophed as needed for MAP goal > 65 - gentle hydration as below, consider albumin - follow culture data- BC/ UC - is DNR/ DNI except for pressors/ abx.  Would be open to possible drainage of hepatic abscesses  Suspected  aspiration pneumonitis Pulmonary nodules Bilateral pulmonary effusions, small R, small to moderate L  - very minimal O2 requirements, denies SOB - abx as above - nodules could be metastasis vs inflammatory/ infectious given suspected aspiration - effusions likely related to HF +/- liver mets.  Defer thora at this time, as it will not change management at this time and he is not having any distress - will need SLP  - intermittent CXR  Hx combined HF, ICM, CAD s/p remote CABG, HTN, HLD Positive troponin  Elevated BNP Prolonged QTc - Evaluated by cardiology.  Do not think this is acute decompensated HF/ cardiogenic shock - holding lasix, metoprolol, and spiro in the setting of vasopressor support - avoid Qtc prolonging meds  PAF On home Eliquis  - remains in NSR - amio taper per cardiology  - holding Eliquis for pending possible drainage of suspected hepatic abscesses  AKI - multifactorial, in the setting of hypotension and poor PO  intake, was still taking diuretics at home , and s/p IV contrast dye on admit Hyponatremia- appears hypovolemic on exam - baseline sCr 0.7 to 1, currently 1.39 - s/p 2L LR, gentle IV hydration for now - pending UA - trend renal indices closely  Normocytic anemia - Hgb 9.8, hemoccult negative - trend CBC closely  Stage IV Metastatic Adenocarcinoma of Pancreas Coagulopathy - INR 2.4 S/p whipple 04/2020, radiation, and Folfirnox 01/2020, since found with metastases to liver, currently on palliative chemo, gemcitabine and paclitaxel, followed at [facility] - palliative care consulted  - repeat INR and CMET in am  RLE DVT (01/2021) - holding eliquis for possible IR procedure as above - CTA neg for PE  Protein calorie malnutrition Failure to thrive 2/2 advanced cancer - Pending PMT consult - NPO for now till SLP, maximize nutrition as able afterwards, consider RDN consult  Best Practice (right click and "Reselect all SmartList Selections" daily)    Diet/type: NPO DVT prophylaxis: SCD GI prophylaxis: N/A Lines: N/A, pta right chest port > accessed 11/6 > Foley:  N/A Code Status:  limited- DNR/ DNI, ok with abx/ pressors, and may be open to IR procedure Last date of multidisciplinary goals of care discussion: 11/6  Discussion held at bedside with patient and [name] (fiance) with Dr. Shearon Stalls and myself.  She states they have been discussing [place] since last hospitalization.  [name] acknowledges fear in suspecting this may be nearing the end.  Chemo has been palliative and he has been unable to have chemo for the last month to recent sharp decline in health, progressive weakness, and abnormal labs.  At this time, we will continue supportive care with abx and vasopressor support via port, and possible IR intervention, but they both acknowledge and agree that if he were to decline further, CPR and aggressive measures would only prolong his life and offer no benefit.  [name] will call his daughters so they can come.    Labs   CBC: Recent Labs  Lab 03/05/21 0647 03/05/21 0820  WBC 14.8*  --   NEUTROABS 14.4*  --   HGB 9.8* 10.5*  10.2*  HCT 28.2* 31.0*  30.0*  MCV 89.0  --   PLT PLATELET CLUMPS NOTED ON SMEAR, UNABLE TO ESTIMATE  --     Basic Metabolic Panel: Recent Labs  Lab 03/05/21 0647 03/05/21 0820  NA 126* 124*  124*  K 4.0 4.0  4.0  CL 93* 92*  CO2 22  --   GLUCOSE 108* 106*  BUN 26* 31*  CREATININE 1.39* 1.40*  CALCIUM 7.6*  --    GFR: Estimated Creatinine Clearance: 40.5 mL/min (A) (by C-G formula based on SCr of 1.4 mg/dL (H)). Recent Labs  Lab 03/05/21 0647 03/05/21 0847  WBC 14.8*  --   LATICACIDVEN 1.9 1.4    Liver Function Tests: Recent Labs  Lab 03/05/21 0647  AST 57*  ALT 31  ALKPHOS 429*  BILITOT 3.4*  PROT 4.7*  ALBUMIN 1.7*   Recent Labs  Lab 03/05/21 0647  LIPASE 18   Recent Labs  Lab 03/05/21 0744  AMMONIA 16    ABG    Component Value Date/Time   PHART 7.413 06/01/2020  0307   PCO2ART 43.4 06/01/2020 0307   PO2ART 113 (H) 06/01/2020 0307   HCO3 25.5 03/05/2021 0820   TCO2 24 03/05/2021 0820   TCO2 26 03/05/2021 0820   O2SAT 99.0 03/05/2021 0820     Coagulation Profile: Recent Labs  Lab 03/05/21 [phone]  INR 2.4*    Cardiac Enzymes: No results for input(s): CKTOTAL, CKMB, CKMBINDEX, TROPONINI in the last 168 hours.  HbA1C: No results found for: HGBA1C  CBG: No results for input(s): GLUCAP in the last 168 hours.  Review of Systems:   Review of Systems  Constitutional:  Positive for chills and malaise/fatigue. Negative for fever.  Respiratory:  Positive for cough. Negative for hemoptysis and shortness of breath.   Cardiovascular:  Negative for chest pain, orthopnea and leg swelling.  Gastrointestinal:  Negative for abdominal pain, diarrhea, nausea and vomiting.  Neurological:  Positive for weakness. Negative for focal weakness and loss of consciousness.   Past Medical History:  He,  has a past medical history of Arthritis, Blood dyscrasia, Cancer ([place]), Coronary artery disease, GERD (gastroesophageal reflux disease), Hyperlipidemia, Hypertension, Myocardial infarct ([place]) (2000), and Pneumonia.   Surgical History:   Past Surgical History:  Procedure Laterality Date   BILIARY DILATION  04/02/2019   Procedure: BILIARY DILATION;  Surgeon: Arta Silence, MD;  Location: [facility] ENDOSCOPY;  Service: Endoscopy;;   BILIARY STENT PLACEMENT  04/02/2019   Procedure: BILIARY STENT PLACEMENT;  Surgeon: Arta Silence, MD;  Location: [facility] ENDOSCOPY;  Service: Endoscopy;;   CORONARY ARTERY BYPASS GRAFT  2000   "CABG X5" (02/03/2013)   ERCP N/A 04/02/2019   Procedure: ENDOSCOPIC RETROGRADE CHOLANGIOPANCREATOGRAPHY (ERCP);  Surgeon: Arta Silence, MD;  Location: [facility] ENDOSCOPY;  Service: Endoscopy;  Laterality: N/A;   ESOPHAGOGASTRODUODENOSCOPY (EGD) WITH PROPOFOL N/A 04/02/2019   Procedure: ESOPHAGOGASTRODUODENOSCOPY (EGD) WITH PROPOFOL;  Surgeon: Arta Silence,  MD;  Location: [place];  Service: Endoscopy;  Laterality: N/A;   EUS N/A 04/02/2019   Procedure: UPPER ENDOSCOPIC ULTRASOUND (EUS) LINEAR;  Surgeon: Arta Silence, MD;  Location: [place];  Service: Endoscopy;  Laterality: N/A;   FINE NEEDLE ASPIRATION  04/02/2019   Procedure: FINE NEEDLE ASPIRATION (FNA) LINEAR;  Surgeon: Arta Silence, MD;  Location: [place];  Service: Endoscopy;;   LEFT HEART CATH AND CORS/GRAFTS ANGIOGRAPHY N/A 06/02/2020   Procedure: LEFT HEART CATH AND CORS/GRAFTS ANGIOGRAPHY;  Surgeon: Nelva Bush, MD;  Location: [place] CV LAB;  Service: Cardiovascular;  Laterality: N/A;   LEFT HEART CATHETERIZATION WITH CORONARY/GRAFT ANGIOGRAM N/A 02/04/2013   Procedure: LEFT HEART CATHETERIZATION WITH Beatrix Fetters;  Surgeon: Candee Furbish, MD;  Location: [facility] CATH LAB;  Service: Cardiovascular;  Laterality: N/A;   PANCREATIC STENT PLACEMENT  04/02/2019   Procedure: PANCREATIC STENT PLACEMENT;  Surgeon: Arta Silence, MD;  Location: [facility] ENDOSCOPY;  Service: Endoscopy;;   SPHINCTEROTOMY  04/02/2019   Procedure: Joan Mayans;  Surgeon: Arta Silence, MD;  Location: [place] ENDOSCOPY;  Service: Endoscopy;;   TONSILLECTOMY  1950's     Social History:   reports that he has quit smoking. His smoking use included cigarettes. He has a 25.00 pack-year smoking history. He has never used smokeless tobacco. He reports that he does not currently use alcohol. He reports that he does not use drugs.   Family History:  His family history includes Heart disease in his father and mother.   Allergies Allergies  Allergen Reactions   Shellfish Allergy Hives   Trazodone And Nefazodone     Unknown     Home Medications  Prior to Admission medications   Medication Sig Start Date End Date Taking? Authorizing Provider  aspirin EC 81 MG tablet Take 1 tablet (81 mg total) by mouth daily. 02/04/13   Jerline Pain, MD  atorvastatin (LIPITOR) 80 MG tablet Take 1 tablet (80 mg  total) by mouth daily. 06/20/20  Kroeger, Lorelee Cover., PA-C  calcium carbonate (OS-CAL) 600 MG TABS tablet Take 600 mg by mouth daily.    [provider]  capecitabine (XELODA) 500 MG tablet daily in the afternoon. 06/08/20   [provider]  clopidogrel (PLAVIX) 75 MG tablet TAKE 1 TABLET BY MOUTH DAILY WITH BREAKFAST. 09/06/20   Lelon Perla, MD  DULoxetine (CYMBALTA) 30 MG capsule Take 30 mg by mouth daily.    [provider]  ferrous sulfate 325 (65 FE) MG tablet Take 325 mg by mouth daily with breakfast. Patient not taking: Reported on 09/12/2020    [provider]  furosemide (LASIX) 20 MG tablet Take 1 tablet (20 mg total) by mouth daily. 08/01/20   Kroeger, Lorelee Cover., PA-C  isosorbide dinitrate (ISORDIL) 30 MG tablet Take 0.5 tablets (15 mg total) by mouth daily. 06/20/20   Kroeger, Lorelee Cover., PA-C  levothyroxine (SYNTHROID) 75 MCG tablet Take 75 mcg by mouth daily before breakfast.    [provider]  losartan (COZAAR) 25 MG tablet Take 1 tablet (25 mg total) by mouth daily. 09/12/20   Lelon Perla, MD  Magnesium Oxide 400 MG CAPS Take 1 capsule (400 mg total) by mouth daily. 07/28/20   Kroeger, Lorelee Cover., PA-C  metoprolol succinate (TOPROL-XL) 50 MG 24 hr tablet Take 1 tablet (50 mg total) by mouth daily. Take with or immediately following a meal. 07/27/20 10/25/20  Kroeger, Lorelee Cover., PA-C  Multiple Vitamin (MULTIVITAMIN WITH MINERALS) TABS tablet Take 1 tablet by mouth daily.    [provider]  naloxone (NARCAN) nasal spray 4 mg/0.1 mL SPRAY 1 SPRAY INTO NOSTRIL ONCE FOR OPIOID OVERDOSE (TURN PERSON ON SIDE AFTER DOSE. IF NO RESPONSE IN 2-3 MINUTES OR PERSON RESPONDS BUT RELAPSES, REPEAT USING A NEW SPRAY DEVICE AND SPRAY INTO THE OTHER NOSTRIL. CALL 911 AFTER USE.) * EMERGENCY USE ONLY * 05/25/20   [provider]  nitroGLYCERIN (NITROSTAT) 0.4 MG SL tablet Place 1 tablet (0.4 mg total) under the tongue every 5 (five) minutes as  needed for chest pain. 06/20/20   Kroeger, Lorelee Cover., PA-C  ondansetron (ZOFRAN) 8 MG tablet Take 1 tablet (8 mg total) by mouth every 8 (eight) hours as needed for nausea or vomiting. 07/15/20   Kyung Rudd, MD  oxyCODONE-acetaminophen (PERCOCET/ROXICET) 5-325 MG tablet TAKE 1 TABLET BY MOUTH EVERY 8 HOURS AS NEEDED FOR PAIN FOR UP TO 7 DAYS 05/19/20   [provider]  pantoprazole (PROTONIX) 40 MG tablet Take 1 tablet (40 mg total) by mouth daily. 06/20/20   Kroeger, Lorelee Cover., PA-C  potassium chloride (KLOR-CON) 10 MEQ tablet Take 1 tablet (10 mEq total) by mouth daily. 07/28/20 10/26/20  Kroeger, Lorelee Cover., PA-C  prochlorperazine (COMPAZINE) 10 MG tablet Take 1 tablet by mouth every 8 (eight) hours. 07/08/19   [provider]  spironolactone (ALDACTONE) 25 MG tablet Take 0.5 tablets (12.5 mg total) by mouth every other day. 06/09/20   Kroeger, Lorelee Cover., PA-C     Critical care time: 51 mins     Kennieth Rad, ACNP [facility] 03/05/2021, 2:50 PM  See Amion for pager If no response to pager, please call PCCM consult pager After 7:00 pm call El[place]

## 2021-03-05 NOTE — ED Provider Notes (Signed)
[facility] EMERGENCY DEPARTMENT Provider Note   CSN: [id number] Arrival date & time: 03/05/21  0636    History Sepsis, hypotension   Isaiah Patterson is a 75 y.o. male with past medical history significant for CAD s/p CABG, GERD, cancer, CHF who presents for evaluation under code sepsis.  Called out by family and noted patient to be confused.  He has been severely weak over the last 3 to 4 days, unable to get out of bed and unable to get his chemotherapy for his liver cancer.  EMS noted patient to be febrile, hypotensive and hypoxic to 80% on room air.  Noted coarse lung sounds.  Received 500 cc fluid bolus in route.  Patient admits to some chest pain, shortness of breath, cough and weakness.  Denies any urinary complaints however does appear confused.  He thinks year is [place].  States he lives with his fiance [place].  Follows with the [place] and [facility] for the majority of his care.    Level 5 caveat-altered mental status  Collateral from patients [place] [employer] at [phone]  2 falls since wed. Very weak. In bed. Slid out of the chair this morning. Confused. Last Ca treatment 4 weeks ago. Saw [facility] 4 days ago, decreased some meds. Not eating however will take occasional sips of water. Pt without complaints at home. No fever, cough, emesis, SOB, abd pain, urinary changes.    Would be ok with IVF, pressors.   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  HPI     Past Medical History:  Diagnosis Date   Arthritis    "hands, fingers" (02/03/2013)   Blood dyscrasia    "told I was a free bleeder when I was young; my blood does have trouble clotting" (02/03/2013)   Cancer ([place])    Coronary artery disease    s/p CABG   GERD (gastroesophageal reflux disease)    Hyperlipidemia     Hypertension    Myocardial infarct ([place]) 2000   Pneumonia    "couple times" (02/03/2013)    Patient Active Problem List   Diagnosis Date Noted   Hypotension 03/05/2021   Acute HFrEF (heart failure with reduced ejection fraction) (HCC)    Non-ST elevation (NSTEMI) myocardial infarction [facility])    Acute CHF (congestive heart failure) ([place]) 06/01/2020   Mild renal insufficiency 06/01/2020   Bronchiolitis 06/01/2020   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   Thoracic compression fracture, sequela 06/01/2020   Closed fracture of proximal end of right humerus 06/01/2020   Acute hypoxemic respiratory failure (HCC)    Pancreatic adenocarcinoma (HCC)    Obstructive jaundice    Hyperbilirubinemia 04/01/2019   Transaminitis 04/01/2019   Elevated alkaline phosphatase level 04/01/2019   Painless jaundice 03/31/2019   Pancreatic lesion 03/31/2019   Obese 02/27/2013   Unstable angina ([place]) 02/03/2013   CAD (coronary artery disease) 02/03/2013   HTN (hypertension) 02/03/2013   Dyslipidemia 02/03/2013    Past Surgical History:  Procedure Laterality Date   BILIARY DILATION  04/02/2019   Procedure: BILIARY DILATION;  Surgeon: Arta Silence, MD;  Location: [place] ENDOSCOPY;  Service: Endoscopy;;   BILIARY STENT PLACEMENT  04/02/2019   Procedure: BILIARY STENT PLACEMENT;  Surgeon: Arta Silence, MD;  Location: [place] ENDOSCOPY;  Service: Endoscopy;;   CORONARY ARTERY BYPASS GRAFT  2000   "  CABG X5" (02/03/2013)   ERCP N/A 04/02/2019   Procedure: ENDOSCOPIC RETROGRADE CHOLANGIOPANCREATOGRAPHY (ERCP);  Surgeon: Arta Silence, MD;  Location: [facility] ENDOSCOPY;  Service: Endoscopy;  Laterality: N/A;   ESOPHAGOGASTRODUODENOSCOPY (EGD) WITH PROPOFOL N/A 04/02/2019   Procedure: ESOPHAGOGASTRODUODENOSCOPY (EGD) WITH PROPOFOL;  Surgeon: Arta Silence, MD;  Location: [place];  Service: Endoscopy;  Laterality: N/A;   EUS N/A 04/02/2019   Procedure: UPPER ENDOSCOPIC ULTRASOUND (EUS) LINEAR;  Surgeon: Arta Silence, MD;   Location: [place];  Service: Endoscopy;  Laterality: N/A;   FINE NEEDLE ASPIRATION  04/02/2019   Procedure: FINE NEEDLE ASPIRATION (FNA) LINEAR;  Surgeon: Arta Silence, MD;  Location: [place];  Service: Endoscopy;;   LEFT HEART CATH AND CORS/GRAFTS ANGIOGRAPHY N/A 06/02/2020   Procedure: LEFT HEART CATH AND CORS/GRAFTS ANGIOGRAPHY;  Surgeon: Nelva Bush, MD;  Location: [place] CV LAB;  Service: Cardiovascular;  Laterality: N/A;   LEFT HEART CATHETERIZATION WITH CORONARY/GRAFT ANGIOGRAM N/A 02/04/2013   Procedure: LEFT HEART CATHETERIZATION WITH Beatrix Fetters;  Surgeon: Candee Furbish, MD;  Location[facility] CATH LAB;  Service: Cardiovascular;  Laterality: N/A;   PANCREATIC STENT PLACEMENT  04/02/2019   Procedure: PANCREATIC STENT PLACEMENT;  Surgeon: Arta Silence, MD;  Location: [place];  Service: Endoscopy;;   SPHINCTEROTOMY  04/02/2019   Procedure: Joan Mayans;  Surgeon: Arta Silence, MD;  Location: [facility] ENDOSCOPY;  Service: Endoscopy;;   TONSILLECTOMY  1950's       Family History  Problem Relation Age of Onset   Heart disease Mother    Heart disease Father     Social History   Tobacco Use   Smoking status: Former    Packs/day: 1.00    Years: 25.00    Pack years: 25.00    Types: Cigarettes   Smokeless tobacco: Never   Tobacco comments:    02/03/2013 "quit smoking ~ 25 yr ago"  Vaping Use   Vaping Use: Every day  Substance Use Topics   Alcohol use: Not Currently    Comment: 02/03/2013 "stopped drinking alcohol in the 1990's; never had problem w/it"   Drug use: No    Home Medications Prior to Admission medications   Medication Sig Start Date End Date Taking? Authorizing Provider  amiodarone (PACERONE) 400 MG tablet Take 400 mg by mouth daily.   Yes [provider]  apixaban (ELIQUIS) 5 MG TABS tablet Take 5 mg by mouth in the morning and at bedtime.   Yes [provider]  atorvastatin (LIPITOR) 40 MG tablet Take 40 mg by  mouth at bedtime.   Yes [provider]  DULoxetine (CYMBALTA) 30 MG capsule Take 30 mg by mouth daily.   Yes [provider]  empagliflozin (JARDIANCE) 10 MG TABS tablet Take 10 mg by mouth daily.   Yes [provider]  ferrous sulfate 325 (65 FE) MG tablet Take 325 mg by mouth daily with breakfast.   Yes [provider]  Levothyroxine Sodium 37.5 MCG/ML SOLN Take 37.5 mcg by mouth daily before breakfast.   Yes [provider]  Magnesium Oxide 400 MG CAPS Take 1 capsule (400 mg total) by mouth daily. 07/28/20  Yes Kroeger, Daleen Snook M., PA-C  metoprolol succinate (TOPROL-XL) 25 MG 24 hr tablet Take 25 mg by mouth daily.   Yes [provider]  nitroGLYCERIN (NITROSTAT) 0.4 MG SL tablet Place 1 tablet (0.4 mg total) under the tongue every 5 (five) minutes as needed for chest pain. 06/20/20  Yes Kroeger, Daleen Snook M., PA-C  ondansetron (ZOFRAN) 8 MG tablet Take  1 tablet (8 mg total) by mouth every 8 (eight) hours as needed for nausea or vomiting. 07/15/20  Yes Kyung Rudd, MD  Pancrelipase, Lip-Prot-Amyl, 24000-76000 units CPEP Take 24,000 Units by mouth in the morning, at noon, and at bedtime.   Yes [provider]  spironolactone (ALDACTONE) 25 MG tablet Take 0.5 tablets (12.5 mg total) by mouth every other day. Patient taking differently: Take 12.5 mg by mouth daily. 06/09/20  Yes Kroeger, Daleen Snook M., PA-C  tamsulosin (FLOMAX) 0.4 MG CAPS capsule Take 0.4 mg by mouth.   Yes [provider]  aspirin EC 81 MG tablet Take 1 tablet (81 mg total) by mouth daily. Patient not taking: No sig reported 02/04/13   Jerline Pain, MD  atorvastatin (LIPITOR) 80 MG tablet Take 1 tablet (80 mg total) by mouth daily. Patient not taking: No sig reported 06/20/20   Roby Lofts M., PA-C  clopidogrel (PLAVIX) 75 MG tablet TAKE 1 TABLET BY MOUTH DAILY WITH BREAKFAST. Patient not taking: No sig reported 09/06/20   Lelon Perla, MD  furosemide (LASIX)  20 MG tablet Take 1 tablet (20 mg total) by mouth daily. Patient not taking: No sig reported 08/01/20   Abigail Butts., PA-C  isosorbide dinitrate (ISORDIL) 30 MG tablet Take 0.5 tablets (15 mg total) by mouth daily. Patient not taking: No sig reported 06/20/20   Roby Lofts M., PA-C  losartan (COZAAR) 25 MG tablet Take 1 tablet (25 mg total) by mouth daily. Patient not taking: No sig reported 09/12/20   Lelon Perla, MD  metoprolol succinate (TOPROL-XL) 50 MG 24 hr tablet Take 1 tablet (50 mg total) by mouth daily. Take with or immediately following a meal. Patient not taking: Reported on 03/05/2021 07/27/20 10/25/20  Abigail Butts., PA-C  pantoprazole (PROTONIX) 40 MG tablet Take 1 tablet (40 mg total) by mouth daily. Patient not taking: No sig reported 06/20/20   Roby Lofts M., PA-C  potassium chloride (KLOR-CON) 10 MEQ tablet Take 1 tablet (10 mEq total) by mouth daily. 07/28/20 10/26/20  Kroeger, Lorelee Cover., PA-C    Allergies    Shellfish allergy and Trazodone and nefazodone  Review of Systems   Review of Systems  Unable to perform ROS: Mental status change  Constitutional:  Positive for activity change, appetite change and fatigue.  Respiratory:  Positive for cough.   Cardiovascular:  Positive for chest pain. Negative for palpitations.  Gastrointestinal:  Positive for vomiting.  Neurological:  Positive for weakness.  All other systems reviewed and are negative.  Physical Exam Updated Vital Signs BP 116/69   Pulse (!) 55   Temp 98.8 F (37.1 C) (Oral)   Resp 18   Wt 73 kg   SpO2 97%   BMI 25.99 kg/m   Physical Exam Vitals and nursing note reviewed.  Constitutional:      General: He is not in acute distress.    Appearance: He is well-developed. He is ill-appearing and toxic-appearing. He is not diaphoretic.  HENT:     Head: Normocephalic and atraumatic.     Nose: Nose normal.     Mouth/Throat:     Mouth: Mucous membranes are dry.  Eyes:     Pupils:  Pupils are equal, round, and reactive to light.  Cardiovascular:     Rate and Rhythm: Normal rate and regular rhythm.     Pulses:          Dorsalis pedis pulses are 1+ on the right side and 1+ on  the left side.     Heart sounds: Normal heart sounds.  Pulmonary:     Effort: Respiratory distress present.     Breath sounds: Rhonchi present.     Comments: Coarse rhonchi throughout, productive cough.  4 L via nasal cannula.  Speaks in short sentences Chest:     Comments: Port, upper chest wall.  Midline old sternotomy scar without surrounding erythema or warmth, chest nontender Abdominal:     General: Bowel sounds are normal. There is no distension.     Palpations: Abdomen is soft.     Tenderness: There is no abdominal tenderness. There is no right CVA tenderness, left CVA tenderness, guarding or rebound.     Comments: Soft, nontender  Musculoskeletal:        General: Normal range of motion.     Cervical back: Normal range of motion and neck supple.     Comments: No bony tenderness.  Moves all 4 extremities without difficulty  Feet:     Comments: No LE edema Skin:    General: Skin is warm and dry.     Capillary Refill: Capillary refill takes less than 2 seconds.     Comments: No rash or lesions  Neurological:     General: No focal deficit present.     Mental Status: He is alert. He is disoriented.     Cranial Nerves: Cranial nerves 2-12 are intact.     Comments: Cranial nerves 2-12 intact Follows commands Alert to person, place, states year is 1923 Equal but weak hand grip Bl 4/5   ED Results / Procedures / Treatments   Labs (all labs ordered are listed, but only abnormal results are displayed) Labs Reviewed  COMPREHENSIVE METABOLIC PANEL - Abnormal; Notable for the following components:      Result Value   Sodium 126 (*)    Chloride 93 (*)    Glucose, Bld 108 (*)    BUN 26 (*)    Creatinine, Ser 1.39 (*)    Calcium 7.6 (*)    Total Protein 4.7 (*)    Albumin 1.7 (*)     AST 57 (*)    Alkaline Phosphatase 429 (*)    Total Bilirubin 3.4 (*)    GFR, Estimated 53 (*)    All other components within normal limits  CBC WITH DIFFERENTIAL/PLATELET - Abnormal; Notable for the following components:   WBC 14.8 (*)    RBC 3.17 (*)    Hemoglobin 9.8 (*)    HCT 28.2 (*)    RDW 18.0 (*)    Neutro Abs 14.4 (*)    Lymphs Abs 0.1 (*)    Abs Immature Granulocytes 0.11 (*)    All other components within normal limits  PROTIME-INR - Abnormal; Notable for the following components:   Prothrombin Time 26.0 (*)    INR 2.4 (*)    All other components within normal limits  BRAIN NATRIURETIC PEPTIDE - Abnormal; Notable for the following components:   B Natriuretic Peptide 1,348.3 (*)    All other components within normal limits  I-STAT CHEM 8, ED - Abnormal; Notable for the following components:   Sodium 124 (*)    Chloride 92 (*)    BUN 31 (*)    Creatinine, Ser 1.40 (*)    Glucose, Bld 106 (*)    Calcium, Ion 0.89 (*)    Hemoglobin 10.5 (*)    HCT 31.0 (*)    All other components within normal limits  I-STAT VENOUS BLOOD  GAS, ED - Abnormal; Notable for the following components:   pH, Ven 7.569 (*)    pCO2, Ven 27.9 (*)    pO2, Ven 113.0 (*)    Acid-Base Excess 4.0 (*)    Sodium 124 (*)    Calcium, Ion 0.89 (*)    HCT 30.0 (*)    Hemoglobin 10.2 (*)    All other components within normal limits  TROPONIN I (HIGH SENSITIVITY) - Abnormal; Notable for the following components:   Troponin I (High Sensitivity) 1,125 (*)    All other components within normal limits  TROPONIN I (HIGH SENSITIVITY) - Abnormal; Notable for the following components:   Troponin I (High Sensitivity) 1,161 (*)    All other components within normal limits  RESP PANEL BY RT-PCR (FLU A&B, COVID) ARPGX2  CULTURE, BLOOD (ROUTINE X 2)  CULTURE, BLOOD (ROUTINE X 2)  URINE CULTURE  MRSA NEXT GEN BY PCR, NASAL  LACTIC ACID, PLASMA  LACTIC ACID, PLASMA  APTT  LIPASE, BLOOD  AMMONIA   PROCALCITONIN  URINALYSIS, ROUTINE W REFLEX MICROSCOPIC  POC OCCULT BLOOD, ED    EKG EKG Interpretation  Date/Time:  Sunday March 05 2021 06:43:54 EST Ventricular Rate:  84 PR Interval:  151 QRS Duration: 97 QT Interval:  473 QTC Calculation: 560 R Axis:   48 Text Interpretation: Sinus rhythm Low voltage, extremity leads Nonspecific T abnormalities, lateral leads Prolonged QT interval t wave flattening Otherwise no significant change Confirmed by Deno Etienne [phone]) on 03/05/2021 6:47:03 AM  Radiology CT HEAD WO CONTRAST (5MM)  Result Date: 03/05/2021 CLINICAL DATA:  Delirium. EXAM: CT HEAD WITHOUT CONTRAST TECHNIQUE: Contiguous axial images were obtained from the base of the skull through the vertex without intravenous contrast. COMPARISON:  12/26/2019 FINDINGS: Brain: No evidence of acute infarction, hemorrhage, hydrocephalus, extra-axial collection, or mass lesion/mass effect. Mild generalized cerebral atrophy and chronic small vessel disease show no significant change. Vascular:  No hyperdense vessel or other acute findings. Skull: No evidence of fracture or other significant bone abnormality. Sinuses/Orbits:  No acute findings. Other: None. IMPRESSION: No acute intracranial abnormality. Stable mild cerebral atrophy and chronic small vessel disease. Electronically Signed   By: Marlaine Hind M.D.   On: 03/05/2021 10:42   CT Angio Chest PE W and/or Wo Contrast  Result Date: 03/05/2021 CLINICAL DATA:  Sepsis. Nausea, vomiting, shortness of breath and chest pain. History of liver cancer. EXAM: CT ANGIOGRAPHY CHEST WITH CONTRAST TECHNIQUE: Multidetector CT imaging of the chest was performed using the standard protocol during bolus administration of intravenous contrast. Multiplanar CT image reconstructions and MIPs were obtained to evaluate the vascular anatomy. CONTRAST:  120m OMNIPAQUE IOHEXOL 350 MG/ML SOLN COMPARISON:  CT chest 06/01/2020 FINDINGS: Cardiovascular: Satisfactory  opacification of the pulmonary arteries to the segmental level. No evidence of pulmonary embolism. Enlarged left heart, stable compared to 06/01/2020. No pericardial effusion. Coronary artery stents and changes of coronary artery bypass graft. Mediastinum/Nodes: No enlarged mediastinal, hilar, or axillary lymph nodes. Punctate calcification noted in the left thyroid. The trachea and esophagus demonstrate no significant findings. Lungs/Pleura: Small right and small-moderate left pleural effusions with adjacent compressive atelectasis in the lower lobes. Subsegmental atelectasis versus scarring in the lingula. Multiple new subpleural nodules are noted in the right upper and middle lobes including: - 0.3 cm solid nodule in the right upper lobe (series 4, image 97) - 0.5 cm subsolid nodule in the right middle lobe (series 4, image 122) - 0.3 cm solid nodule in the right middle lobe (series  4, image 124) - 0.4 cm solid nodule in the right middle lobe (series 4, image 137 Upper Abdomen: Partially visualized hypodense hepatic masses, consistent with history of liver cancer. At least moderate volume of ascites is seen within the upper abdomen. No free intraperitoneal air in the visualized upper abdomen. Please see today's dedicated CT abdomen pelvis report for further details. Musculoskeletal: Interval vertebral body augmentation of the T7 and T8 compression fractures. Chronic deformity of the right humeral head. No acute osseous abnormality. No suspicious osseous lesion. Review of the MIP images confirms the above findings. IMPRESSION: 1. No pulmonary embolism. 2. Small right and small-moderate left pleural effusions with adjacent compressive atelectasis in the lower lobes. 3. Multiple new solid and sub-solid subpleural nodules in the right upper and middle lobes. Favor infectious versus inflammatory etiology, though in the setting of known malignancy, findings are indeterminate. Recommend attention on follow-up imaging.  4. Interval vertebral body augmentation of the T7 and T8 compression fractures. 5. Ascites and multiple hepatic masses seen in the visualized upper abdomen. Please see today's dedicated CT abdomen and pelvis report for further details. Electronically Signed   By: Ileana Roup M.D.   On: 03/05/2021 11:04   CT ABDOMEN PELVIS W CONTRAST  Result Date: 03/05/2021 CLINICAL DATA:  History of pancreatic adenocarcinoma status post Whipple procedure. Nausea and vomiting. Sepsis. EXAM: CT ABDOMEN AND PELVIS WITH CONTRAST TECHNIQUE: Multidetector CT imaging of the abdomen and pelvis was performed using the standard protocol following bolus administration of intravenous contrast. CONTRAST:  117m OMNIPAQUE IOHEXOL 350 MG/ML SOLN COMPARISON:  CT abdomen pelvis 05/25/2020 CT abdomen pelvis 03/31/2019 FINDINGS: Lower chest: Partially visualized bilateral pleural effusions with compressive atelectasis in the lower lobes. Enlarged left heart. Postoperative changes of CABG. Please see today's dedicated CT chest report for further details. Hepatobiliary: A conglomerate of multi-septated hypodense masses occupies the majority of the left lobe of the liver spanning approximately 7 cm transverse x 8.7 cm AP x 10.6 cm craniocaudal dimension (series 6, image 29; series 10, image 59). An additional ill-defined hypodensity spanning 2.4 x 2.4 x 2.4 cm is noted in the hepatic segment V (series 6, image 27; series 10, image 38). Trace pneumobilia, consistent with history of Whipple procedure. Cholecystectomy. Pancreas: Postoperative changes of Whipple. There is moderate parenchymal atrophy in the pancreatic body and tail. The main pancreatic duct is not dilated. Spleen: Borderline enlarged, measuring 12.6 cm in length. There are several small wedge-shaped areas of hypoenhancement at the superior and posterior aspects of the spleen, new from prior exam 05/25/2020 (series 6 images 11, 18, 21). Adrenals/Urinary Tract: Adrenal glands are  unremarkable. Kidneys are normal, without renal calculi, focal lesion, or hydronephrosis. Bladder is unremarkable. Stomach/Bowel: Postoperative changes of Whipple procedure. Appendix appears normal. No bowel wall thickening or obstruction. Vascular/Lymphatic: An ill-defined 7.3 cm linear hypodensity spanning segment VI (series 6, image 28; series 9, image 55) likely represents the occluded posterior branch of the right portal vein. The left portal vein is patent but decreased in caliber compared to prior exam 05/25/2020. There is a focal narrowing of the main portal vein at the junction with the splenic vein which is new compared to 05/25/2020. SMV and splenic vein are patent. Vague soft tissue density surrounding the common hepatic artery and junction of the main portal vein and splenic vein (series 6, images 29, 30, 32). Reproductive: Prostate is enlarged with mass effect on the inferior wall of the urinary bladder. Other: Large volume simple abdominopelvic ascites. No free intra-abdominal air.  Musculoskeletal: Stable compression fracture of the T12 vertebral body with approximately 50% vertebral body height loss. Advanced degenerative disc disease at L3-L4 with vacuum disc phenomenon. No acute or suspicious osseous finding. IMPRESSION: 1. A conglomerate of multi-septated hypodense masses occupies nearly the entirety of the left lobe of the liver. In the setting of sepsis, findings are concerning for hepatic abscesses. 2. Focal severe narrowing of the main portal vein at the confluence with the splenic vein and SMV. Vague soft tissue at the focal area of narrowing as well as surrounding the common hepatic artery is concerning for recurrent malignancy. 3. 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 4. Multiple small wedge-shaped infarcts are noted in the superior and posterior aspects of the spleen. 5. Large volume simple abdominopelvic ascites. RAD These results were called by telephone at  the time of interpretation on 03/05/2021 at 11:49 am to provider [facility] , who verbally acknowledged these results. Electronically Signed   By: Ileana Roup M.D.   On: 03/05/2021 11:51   DG Chest Port 1 View  Result Date: 03/05/2021 CLINICAL DATA:  75 year old male with possible sepsis. "Liver cancer." EXAM: PORTABLE CHEST 1 VIEW COMPARISON:  Portable chest 06/02/2020 and earlier. FINDINGS: Portable AP semi upright view at 0657 hours. Stable right chest Port-A-Cath. Stable mild cardiomegaly and mediastinal contours with prior CABG. Visualized tracheal air column is within normal limits. No pneumothorax or pulmonary edema. Lower lung volumes with patchy and veiling lung base opacity greater on the left. No air bronchograms. Negative visible bowel gas. Upper abdominal surgical clips. Chronic left 6th rib fracture. No acute osseous abnormality identified. IMPRESSION: Lower lung volumes with patchy and veiling lung base opacity greater on the left. Consider small pleural effusions with atelectasis. Lung base pneumonia not excluded. Electronically Signed   By: Genevie Ann M.D.   On: 03/05/2021 07:13     EF < 20 % at [facility] 01/2021   Procedures .Critical Care Performed by: Nettie Elm, PA-C Authorized by: Nettie Elm, PA-C   Critical care provider statement:    Critical care time (minutes):  76   Critical care was necessary to treat or prevent imminent or life-threatening deterioration of the following conditions:  Cardiac failure, circulatory failure, sepsis, shock and hepatic failure   Critical care was time spent personally by me on the following activities:  Blood draw for specimens, development of treatment plan with patient or surrogate, discussions with consultants, discussions with primary provider, evaluation of patient's response to treatment, examination of patient, obtaining history from patient or surrogate, ordering and performing treatments and interventions, ordering and  review of laboratory studies, ordering and review of radiographic studies, pulse oximetry, re-evaluation of patient's condition and review of old charts   Medications Ordered in ED Medications  lactated ringers infusion (0 mLs Intravenous Stopped 03/05/21 0730)  vancomycin (VANCOREADY) IVPB 1000 mg/200 mL (has no administration in time range)  ceFEPIme (MAXIPIME) 2 g in sodium chloride 0.9 % 100 mL IVPB (has no administration in time range)  norepinephrine (LEVOPHED) 91m in 2517mpremix infusion (2 mcg/min Intravenous New Bag/Given 03/05/21 1244)  lactated ringers bolus 2,000 mL (0 mLs Intravenous Stopped 03/05/21 0918)  ceFEPIme (MAXIPIME) 2 g in sodium chloride 0.9 % 100 mL IVPB (0 g Intravenous Stopped 03/05/21 0744)  metroNIDAZOLE (FLAGYL) IVPB 500 mg (0 mg Intravenous Stopped 03/05/21 0852)  vancomycin (VANCOREADY) IVPB 1500 mg/300 mL (0 mg Intravenous Stopped 03/05/21 1054)  iohexol (OMNIPAQUE) 350 MG/ML injection 100 mL (100  mLs Intravenous Contrast Given 03/05/21 1007)   ED Course  I have reviewed the triage vital signs and the nursing notes.  Pertinent labs & imaging results that were available during my care of the patient were reviewed by me and considered in my medical decision making (see chart for details).  Here for evaluation of sepsis. Low grade temp with EMS however afebrile. Hypotension here with systolic into 38'B and hypoxic res failure into 80%. Appears ill. NV intact however appear mildly confused. Course lung sounds on 2 L [place]. Occult neg stool. Admits to some CP with me however denies with attending. No clinical evidence of fluids overload, VTE on exam. Extremities warm and perfused. Code sepsis called on arrival, will start broad spectrum abx. Unclear source of hypotension at this time, get broad workup for shock.  Labs and imaging personally reviewed and interpreted:  Occult neg DG chest with pleural effusion vs infection  CBC with leukocytosis at 14.8, Hgb 9.8 CMP  hyponatremia 126, creatinine 1.39 Alk phos 429, Tbili 3.4 INR 2.4 Lactic 1.9>>1.4 CNP 1348 Ammonia 16 Lipase 18 Trop 1161 BNP 1348  CONSULT with Cardiology given unclear if cardiogenic shock vs septic? Cards to assess patient at bedside.  Has some initial improvement in BP into low 01'B systolic MAP >51 at this time.  Patient reassessed.  Continues to have hypotension.  Started on Levophed for blood pressure support.  Awaiting CT reads  CTA chest without PE however multiple pulmonary nodules, question infection, inflammation, metastatic disease.  Pleural effusion bilaterally  CT abdomen pelvis shows possible hepatic abscess, splenic infarct, narrowing and occlusion portal vein as well as portal brain  CONSULT with Dr. Grandville Silos with General surgery. States likely are abscesses. Rec IR consult.  CONSULT with PCCM. Will assess at bedside for admission.  Placed palliative care consult for [place] conversation.  CONSULT with Dr. Kathlene Cote with IR. Will see patient tomorrow.  Recommends n.p.o. after midnight, need to get patient's INR down if any procedures to be done.  May need to tap ascites and aspirate liver mets.  APP will round on patient tomorrow.  Patient critically ill.  Will be admitted for further management and work-up.    MDM Rules/Calculators/A&P                            Final Clinical Impression(s) / ED Diagnoses Final diagnoses:  Shock ([place])  Liver masses  Infarction of spleen  Lung nodules  Metastatic malignant neoplasm, unspecified site [facility])  Vascular occlusion  Elevated troponin  Elevated brain natriuretic peptide (BNP) level  Hyponatremia    Rx / DC Orders ED Discharge Orders     None        Akilah Cureton A, PA-C 03/05/21 [place], [place], DO 03/07/21 1457

## 2021-03-05 NOTE — Progress Notes (Signed)
Pharmacy Antibiotic Note  Isaiah Patterson is a 75 y.o. male admitted on 03/05/2021 with sepsis.  Pharmacy has been consulted for Vancomycin/cefepime dosing.  Scr 1.4  Plan: Vancomycin 1500mg  LD x1 followed by vanco 1000mg  q24hr (eAUC 500) Cefepime 2gm q12hr Plan to obtain levels at steady state Will monitor for acute changes in renal function and adjust as needed F/u cultures results and de-escalate as appropriate Ordered MRSA nares   Weight: 73 kg (161 lb)  Temp (24hrs), Avg:98.8 F (37.1 C), Min:98.8 F (37.1 C), Max:98.8 F (37.1 C)  Recent Labs  Lab 03/05/21 0647 03/05/21 0820  WBC 14.8*  --   CREATININE 1.39* 1.40*  LATICACIDVEN 1.9  --     Estimated Creatinine Clearance: 40.5 mL/min (A) (by C-G formula based on SCr of 1.4 mg/dL (H)).    Allergies  Allergen Reactions   Shellfish Allergy Hives   Trazodone And Nefazodone     Unknown    Antimicrobials this admission: Vanco 11/6 >>  cefepime 11/6 >>   Dose adjustments this admission: none  Microbiology results: 11/6 BCx: IP 11/6 UCx: IP  11/6 Sputum: IP  11/6 MRSA PCR: IP  Thank you for allowing pharmacy to be a part of this patient's care. Thank you for allowing pharmacy to be a part of this patient's care.  Donnald Garre, PharmD Clinical Pharmacist  Please check AMION for all [place] numbers After 10:00 PM, call [place] [phone]

## 2021-03-05 NOTE — Progress Notes (Signed)
eLink Physician-Brief Progress Note Patient Name: Isaiah Patterson DOB: Jan 13, 1946 MRN: [id number]   Date of Service  03/05/2021  HPI/Events of Note  Pain d/t metastatic Pancreatic CA. BP soft = 98/70.  eICU Interventions  Plan: Fentanyl 25-50 mcg IV Q 2 hours PRN severe pain.     Intervention Category Major Interventions: Other:  Lysle Dingwall 03/05/2021, 7:41 PM

## 2021-03-05 NOTE — ED Notes (Signed)
DNR bracelet applied, confirmed name and birthday.

## 2021-03-05 NOTE — Consult Note (Signed)
Consultation Note Date: 03/05/2021   Patient Name: Isaiah Patterson  DOB: [birth year]  MRN: [id number]  Age / Sex: 75 y.o., male  PCP: [facility] Referring Physician: Spero Geralds, MD  Reason for Consultation: Establishing goals of care, "end of life care"  HPI/Patient Profile: 75 y.o. 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 CT was concerning for hepatic abscess. Patient was admitted on 03/05/2021 with hypotension, aspiration pneumonitis, pulmonary nodules, AKI, stage IV metastatic adenocarcinoma of pancreas, protein calorie malnutrition/FTT.   Clinical Assessment and Goals of Care: I have reviewed medical records including EPIC notes, labs, and imaging. Received report from primary RN - no acute concerns. Patient is on low dose of levo.   Went to visit patient at bedside - fiance/[name] and daughter/Tammy present. Patient was lying in bed awake, alert, oriented, and able to participate in conversation intermittently (was in and out of sleep during visit). No signs or non-verbal gestures of pain or discomfort noted. No respiratory distress, increased work of breathing, or secretions noted. Patient states he feels "lousy"; denies pain or shortness of breath.   Met with patient and family  to discuss diagnosis, prognosis, GOC, EOL wishes, disposition, and options.  I introduced Palliative Medicine as specialized medical care for people living with serious illness. It focuses on providing relief from the symptoms and stress of a serious illness. The goal is to improve quality of life for both the patient and the family.  We discussed a brief life review of the patient as well as functional and  nutritional status. Patient has been engaged to fiance/[name] for 10 years. He has two daughters. Prior to hospitalization, patient was living in a private residence with [place]. She describes his apptite as "good, but now it's dropped off." As of one week ago, patient was independent with ambulation and all ADLs. [name] describes his decline as quick and sudden - as of Wednesday he was too weak to walk and has been sleeping most of the day each day. Patient was first diagnosed with cancer in November 2020. He underwent chemotherapy, radiation in March/April 2022, and started another course of chemo recently; however, he has been too weak to receive chemo for the last month. [name] states the last chemo "took a lot out of him."  We discussed patient's current illness and what it means in the larger context of patient's on-going co-morbidities.  Family have a clear understanding of patient's current medical situation. They are aware he will likely not get strong enough to continue chemo again. Natural disease trajectory and expectations at EOL were discussed. I attempted to elicit values and goals of care important to the patient. The difference between aggressive medical intervention and comfort care was considered in light of the patient's goals of care. We discussed goals around drain placement and paracentesis - patient was agreeable  to pursue these interventions, which are planned for tomorrow.  Reviewed options after drain placement to include full scope care to clear up infection vs transition to full comfort care. Reviewed it is anticipated patient will continue to decline overall. Encouraged family and patient to think about how they would want him to spend his last days to weeks. Encouraged transfer out of the hospital while patient is stable if that is their goal.   Provided education and counseling at length on the philosophy and benefits of hospice care. Discussed that it offers a holistic approach  to care in the setting of end-stage illness and is about supporting the patient where they are allowing nature to take it's course. Discussed the hospice team includes RNs, physicians, social workers, and chaplains. They can provide personal care, support for the family, and help keep patient out of the hospital as well as assist with DME needs for home hospice. Education provided on the difference between home vs residential hospice.   Advance directives were considered and discussed. Patient does not have a living will or HCPOA. Patient's daughter agrees that fiance/[name] is Media planner and primary contact. They seem to get a [place] well and are in contact with each other regarding updates/decisions.  After discussion, family are taking time to consider options/information as they understandably feel overwhelmed with information today. Short term goal is to pursue drain placement tomorrow. They will discuss further among themselves and patient on if they wish to pursue treatment for infection with risk he may become unstable for transfer while in house vs transition to full comfort with goal to get patient discharged with hospice while stable. They are also deciding between home vs residential hospice.   [name] has financial questions around hospice care - she requests to utilize [employer] - will notify liaison to contact her to assist with her questions.  Discussed with patient/family the importance of continued conversation with each other and the medical providers regarding overall plan of care and treatment options, ensuring decisions are within the context of the patient's values and GOCs.    Questions and concerns were addressed. The patient/family was encouraged to call with questions and/or concerns. PMT card was provided.    Primary Decision Maker: Legal decision makers are patient's two children. Daughter I spoke with today states they defer decisions to patient's fiance/[name].     SUMMARY OF RECOMMENDATIONS   Continue to treat the treatable - patient agreeable to pursue drain placement and paracentesis tomorrow 11/7 Continue DNR/DNI as previously documented Patient and family deciding on: if goal is to pursue full treatment for infection before discharge vs transition to full comfort care/discharge with hospice as soon as possible Family deciding on residential hospice vs home hospice at discharge - requesting [facility] liaison notified of fiance's financial questions and request for call to discuss If fiance is not present at bedside during rounds, she requests to be called with any updates  PMT will continue to follow and support holistically  Code Status/Advance Care Planning: DNR  Palliative Prophylaxis:  Aspiration, Bowel Regimen, Delirium Protocol, Frequent Pain Assessment, Oral Care, and Turn Reposition  Additional Recommendations (Limitations, Scope, Preferences): Full Scope Treatment  Psycho-social/Spiritual:  Created space and opportunity for patient and family to express thoughts and feelings regarding patient's current medical situation.  Emotional support and therapeutic listening provided.  Prognosis:  Days-weeks  Discharge Planning: residential vs home hospice      Primary Diagnoses: Present on Admission:  Hypotension   I have reviewed  the medical record, interviewed the patient and family, and examined the patient. The following aspects are pertinent.  Past Medical History:  Diagnosis Date   Arthritis    "hands, fingers" (02/03/2013)   Blood dyscrasia    "told I was a free bleeder when I was young; my blood does have trouble clotting" (02/03/2013)   Cancer ([place])    Coronary artery disease    s/p CABG   GERD (gastroesophageal reflux disease)    Hyperlipidemia    Hypertension    Myocardial infarct ([place]) 2000   Pneumonia    "couple times" (02/03/2013)   Social History   Socioeconomic History   Marital status: Divorced     Spouse name: Not on file   Number of children: Not on file   Years of education: Not on file   Highest education level: Not on file  Occupational History   Not on file  Tobacco Use   Smoking status: Former    Packs/day: 1.00    Years: 25.00    Pack years: 25.00    Types: Cigarettes   Smokeless tobacco: Never   Tobacco comments:    02/03/2013 "quit smoking ~ 25 yr ago"  Vaping Use   Vaping Use: Every day  Substance and Sexual Activity   Alcohol use: Not Currently    Comment: 02/03/2013 "stopped drinking alcohol in the 1990's; never had problem w/it"   Drug use: No   Sexual activity: Yes  Other Topics Concern   Not on file  Social History Narrative   Not on file   Social Determinants of Health   Financial Resource Strain: Low Risk    Difficulty of Paying Living Expenses: Not hard at all  Food Insecurity: No Food Insecurity   Worried About Charity fundraiser in the Last Year: Never true   Ran Out of Food in the Last Year: Never true  Transportation Needs: No Transportation Needs   Lack of Transportation (Medical): No   Lack of Transportation (Non-Medical): No  Physical Activity: Not on file  Stress: Not on file  Social Connections: Not on file   Family History  Problem Relation Age of Onset   Heart disease Mother    Heart disease Father    Scheduled Meds:  Chlorhexidine Gluconate Cloth  6 each Topical Daily   Continuous Infusions:  ceFEPime (MAXIPIME) IV     lactated ringers Stopped (03/05/21 0730)   norepinephrine (LEVOPHED) Adult infusion 2 mcg/min (03/05/21 1700)   [START ON 03/06/2021] vancomycin     PRN Meds:. Medications Prior to Admission:  Prior to Admission medications   Medication Sig Start Date End Date Taking? Authorizing Provider  amiodarone (PACERONE) 400 MG tablet Take 400 mg by mouth daily.   Yes [provider]  apixaban (ELIQUIS) 5 MG TABS tablet Take 5 mg by mouth in the morning and at bedtime.   Yes [provider]   atorvastatin (LIPITOR) 40 MG tablet Take 40 mg by mouth at bedtime.   Yes [provider]  DULoxetine (CYMBALTA) 30 MG capsule Take 30 mg by mouth daily.   Yes [provider]  empagliflozin (JARDIANCE) 10 MG TABS tablet Take 10 mg by mouth daily.   Yes [provider]  ferrous sulfate 325 (65 FE) MG tablet Take 325 mg by mouth daily with breakfast.   Yes [provider]  Levothyroxine Sodium 37.5 MCG/ML SOLN Take 37.5 mcg by mouth daily before breakfast.   Yes [provider]  Magnesium Oxide 400  MG CAPS Take 1 capsule (400 mg total) by mouth daily. 07/28/20  Yes Kroeger, Daleen Snook M., PA-C  metoprolol succinate (TOPROL-XL) 25 MG 24 hr tablet Take 25 mg by mouth daily.   Yes [provider]  nitroGLYCERIN (NITROSTAT) 0.4 MG SL tablet Place 1 tablet (0.4 mg total) under the tongue every 5 (five) minutes as needed for chest pain. 06/20/20  Yes Kroeger, Daleen Snook M., PA-C  ondansetron (ZOFRAN) 8 MG tablet Take 1 tablet (8 mg total) by mouth every 8 (eight) hours as needed for nausea or vomiting. 07/15/20  Yes Kyung Rudd, MD  Pancrelipase, Lip-Prot-Amyl, 24000-76000 units CPEP Take 24,000 Units by mouth in the morning, at noon, and at bedtime.   Yes [provider]  spironolactone (ALDACTONE) 25 MG tablet Take 0.5 tablets (12.5 mg total) by mouth every other day. Patient taking differently: Take 12.5 mg by mouth daily. 06/09/20  Yes Kroeger, Daleen Snook M., PA-C  tamsulosin (FLOMAX) 0.4 MG CAPS capsule Take 0.4 mg by mouth.   Yes [provider]  aspirin EC 81 MG tablet Take 1 tablet (81 mg total) by mouth daily. Patient not taking: No sig reported 02/04/13   Jerline Pain, MD  atorvastatin (LIPITOR) 80 MG tablet Take 1 tablet (80 mg total) by mouth daily. Patient not taking: No sig reported 06/20/20   Roby Lofts M., PA-C  clopidogrel (PLAVIX) 75 MG tablet TAKE 1 TABLET BY MOUTH DAILY WITH BREAKFAST. Patient not taking: No sig reported  09/06/20   Lelon Perla, MD  furosemide (LASIX) 20 MG tablet Take 1 tablet (20 mg total) by mouth daily. Patient not taking: No sig reported 08/01/20   Abigail Butts., PA-C  isosorbide dinitrate (ISORDIL) 30 MG tablet Take 0.5 tablets (15 mg total) by mouth daily. Patient not taking: No sig reported 06/20/20   Roby Lofts M., PA-C  losartan (COZAAR) 25 MG tablet Take 1 tablet (25 mg total) by mouth daily. Patient not taking: No sig reported 09/12/20   Lelon Perla, MD  metoprolol succinate (TOPROL-XL) 50 MG 24 hr tablet Take 1 tablet (50 mg total) by mouth daily. Take with or immediately following a meal. Patient not taking: Reported on 03/05/2021 07/27/20 10/25/20  Abigail Butts., PA-C  pantoprazole (PROTONIX) 40 MG tablet Take 1 tablet (40 mg total) by mouth daily. Patient not taking: No sig reported 06/20/20   Roby Lofts M., PA-C  potassium chloride (KLOR-CON) 10 MEQ tablet Take 1 tablet (10 mEq total) by mouth daily. 07/28/20 10/26/20  Kroeger, Lorelee Cover., PA-C   Allergies  Allergen Reactions   Shellfish Allergy Hives   Trazodone And Nefazodone Other (See Comments)    Unknown   Review of Systems  Constitutional:  Positive for activity change, appetite change and fatigue.  Gastrointestinal:  Positive for nausea. Negative for vomiting.  All other systems reviewed and are negative.  Physical Exam Vitals and nursing note reviewed.  Constitutional:      General: He is not in acute distress.    Appearance: He is cachectic. He is ill-appearing.  Pulmonary:     Effort: No respiratory distress.  Skin:    General: Skin is warm and dry.  Neurological:     Mental Status: He is oriented to person, place, and time. He is lethargic.     Motor: Weakness present.  Psychiatric:        Attention and Perception: Attention normal.        Behavior: Behavior is cooperative.  Cognition and Memory: Cognition and memory normal.    Vital Signs: BP 114/73   Pulse 81   Temp  (!) 97.4 F (36.3 C) (Oral)   Resp 19   Ht '5\' 6"'  (1.676 m)   Wt 66.8 kg   SpO2 99%   BMI 23.77 kg/m  Pain Scale: 0-10   Pain Score: 0-No pain   SpO2: SpO2: 99 % O2 Device:SpO2: 99 % O2 Flow Rate: .O2 Flow Rate (L/min): 2 L/min  IO: Intake/output summary:  Intake/Output Summary (Last 24 hours) at 03/05/2021 1750 Last data filed at 03/05/2021 1700 Gross per 24 hour  Intake 1500.28 ml  Output --  Net 1500.28 ml    LBM: Last BM Date: 03/05/21 Baseline Weight: Weight: 73 kg Most recent weight: Weight: 66.8 kg     Palliative Assessment/Data: PPS 30%     Time In: 1750 Time Out: 1910 Time Total: 80 minutes  Greater than 50%  of this time was spent counseling and coordinating care related to the above assessment and plan.  Signed by: Lin Landsman, NP   Please contact Palliative Medicine Team phone at [phone] for questions and concerns.  For individual provider: See Shea Evans

## 2021-03-05 NOTE — ED Provider Notes (Signed)
I personally evaluated the patient during the encounter and completed a history, physical, procedures, medical decision making to contribute to the overall care of the patient and decision making for the patient briefly, the patient is a 75 y.o. male with history of liver cancer currently undergoing chemotherapy, CAD status post CABG, CHF, recent DVT on Eliquis who presents the ED with generalized weakness.  Hypotensive at 87/56 upon arrival.  Rectal temperature is 99.2.  Otherwise vital signs are unremarkable.  Oxygen levels between 88 and 92 and placed on 2 L of oxygen.  He has no specific complaints other than that he feels weak.  His stool is grossly brown on exam and Hemoccult is negative.  Bedside echocardiogram showed decreased EF but appears that his baseline EF is around 20 to 25%.  There is no pericardial effusion.  Unable to obtain review of his IVC.  His extremities are warm and no major pitting edema.  Broad work-up initiated given hypotension and concern for shock.  Could be sepsis could be obstructive such as PE or could be cardiogenic shock but seems less likely given warm extremities.  We will start with a fluid bolus given his tenuous EF and no clear indication that this is sepsis at this time.  Does not appear to have a GI bleed.  Chest x-ray has already been performed and overall shows may be some small pleural effusions possibly pneumonia.  Broad-spectrum IV antibiotics have been started.  He has had a recent DVT and will get CT scan to evaluate for PE.  His abdominal exam is overall benign and there does not appear to be any signs of ascites on a quick bedside ultrasound.  We will obtain a head CT and abdomen and pelvis CT.  Basic labs and blood cultures have been collected as well.  Troponin and BNP also collected.  EKG shows sinus rhythm.  No obvious ischemic changes.  Is not having any chest pain.  Patient seems to be able to provide a good history.  We talked with his partner who states  that he was DNR/DNI at his last hospital stay but he is not sure that he wants to be that at this time.  Sounds like he needs further decisions/thought process about this.  Hopefully when family arrives they can talk about this more.  Believe patient should remain full code at this time as he does appear to have some capacity to make this decision.  He is currently undergoing chemotherapy for liver cancer.  Possibly that he could be neutropenic.  He does have a port but no signs of infection on his port.  Sodium is 124.  Troponin is 1200.  Lactic acid normal.  Mild leukocytosis of 14.  Bilirubin 3.4.  Liver enzymes mildly elevated as well.  Creatinine mildly elevated above baseline at 1.4.  Hemoglobin stable at 9.8.  Awaiting to get a CT scan to rule out PE/other infectious process.  Suspect that this is cardiogenic in nature.  Cardiology has ready been consulted and will come down to the ED to evaluate the patient.  Blood pressure remains stable in the upper 80s and low 90s.  I have much lower suspicion for sepsis.  Fluids have been stopped at 1500 cc as do not want to cause more cardiac issues.  We will hold off on pressors at this time and await cardiology recommendations.  Blood pressure still tenuous.  We will start him on Levophed.  Cardiology agrees with this.  Overall I suspect  that this is ongoing malignancy related.  CT scan showed may be infectious process in the lungs.  Liver masses that could be liver abscesses in the setting of sepsis.  Clinically I think that this is more progression of his malignancy put in a cardiac strain on him.  Personally I think the focus should be on palliative care but ICU has been called to help provide further management.  Patient with shock, broad-spectrum IV antibiotics have been started in case this is from a sepsis standpoint.  Believe there is a cardiogenic shock component as well.  We will have surgery evaluate given concern for may be hepatic abscesses.  Goals of  care discussion to be ongoing with inpatient team as well.  I have consulted palliative care and will make them aware of the patient if they call back.  This chart was dictated using voice recognition software.  Despite best efforts to proofread,  errors can occur which can change the documentation meaning.    EKG Interpretation  Date/Time:  Sunday March 05 2021 06:43:54 EST Ventricular Rate:  84 PR Interval:  151 QRS Duration: 97 QT Interval:  473 QTC Calculation: 560 R Axis:   48 Text Interpretation: Sinus rhythm Low voltage, extremity leads Nonspecific T abnormalities, lateral leads Prolonged QT interval t wave flattening Otherwise no significant change Confirmed by Deno Etienne [phone]) on 03/05/2021 6:47:03 AM         EMERGENCY DEPARTMENT [place] CARDIAC EXAM "Study: Limited Ultrasound of the Heart and Pericardium"  INDICATIONS:Abnormal vital signs Multiple views of the heart and pericardium were obtained in real-time with a multi-frequency probe.  PERFORMED PY:PPJKDT IMAGES ARCHIVED?: No LIMITATIONS:  body habitus VIEWS USED: Parasternal Knight axis INTERPRETATION: Cardiac activity present, Pericardial effusioin absent, and Decreased contractility   EMERGENCY DEPARTMENT [place] ACITES EXAM "Study: Limited Abdominal Ultrasound for Evaluation of Free Fluid"  INDICATIONS:  Liver cancer hx  PERFORMED BY: Myself IMAGES ARCHIVED?: No VIEWS USES: Right lower quad INTERPRETATION: Free fluid not present  .Critical Care Performed by: Lennice Sites, DO Authorized by: Lennice Sites, DO   Critical care provider statement:    Critical care time (minutes):  35   Critical care was necessary to treat or prevent imminent or life-threatening deterioration of the following conditions:  Circulatory failure and shock   Critical care was time spent personally by me on the following activities:  Blood draw for specimens, development of treatment plan with patient or surrogate, discussions with  consultants, evaluation of patient's response to treatment, discussions with primary provider, obtaining history from patient or surrogate, interpretation of cardiac output measurements, ordering and performing treatments and interventions, ordering and review of laboratory studies, re-evaluation of patient's condition, review of old charts, pulse oximetry and ordering and review of radiographic studies   Care discussed with: admitting provider        Lennice Sites, DO 03/05/21 1223

## 2021-03-05 NOTE — Consult Note (Addendum)
Cardiology Consultation:   Patient ID: Isaiah Patterson MRN: [id number]; DOB: 26-Mar-1946  Admit date: 03/05/2021 Date of Consult: 03/05/2021  PCP:  [facility]   [facility] Providers Cardiologist:  Isaiah Ruths, MD   {  Patient Profile:   Isaiah Patterson is a 75 y.o. 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  History of Present Illness:   Isaiah Patterson is a 75 year old male with the above history.  He was previously followed by Dr. Stanford Patterson but now follows at [facility] for his cardiac care.  Patient has history of remote CABG x5 in 2000.  He was admitted in 05/2020 with acute combined CHF.  Echo showed LVEF of less than 20% moderately reduced RV systolic function.  He underwent cardiac catheterization to rule out ischemic etiology etiology of his cardiomyopathy which showed atretic free RIMA to right PDA and patent LIMA to LAD, SVG to D2, and SVG to OM1.  Unchanged from prior cath in 2014.  Medical therapy was recommended.  GDMT was optimized.  He was felt to be poor candidate for ICD given recurrent cancer.  Follow-up visit in 06/2020, ZIO monitor was ordered for further evaluation of suspected multi atrial tachycardia and dizziness/lightheadedness.  Monitor showed underlying sinus rhythm with PACs/PVCs, short runs of both SVT and nonsustained VT.  No atrial fibrillation was documented.  Last echo in 08/2020 showed LVEF of less than 20% with global hypokinesis, severe biatrial enlargement, moderate MR.  RV size and function were normal.  He was admitted at [facility] in 01/2021 with atrial fibrillation with RVR and  decompensated CHF.  He was also found to be profoundly anemic with hemoglobin of 5.9.  Echo again showed LVEF of less than 20%.  He was transfused 2 units of packed red blood cells and diuresed with IV Lasix.  He was also started on IV Cardizem infusion and converted to sinus rhythm.  Of note, he was also found to have a right lower extremity DVT.  DAPT was discontinued and he was started on Eliquis instead.  He was discharged on Amiodarone taper.  Patient was seen by Cardiology at [facility] on 03/01/2021 at which time he was very fatigued but was otherwise stable from a cardiac standpoint.  His BP was soft he was maintaining sinus rhythm.  He denies any bleeding issues on Eliquis.  Patient presented to the [facility] ED today via EMS for further evaluation of weakness.  On EMS arrival, patient was hypotensive with BP of 90/40 and confused.Marland Kitchen  He was given 500 cc normal saline bolus. Upon arrival to the ED, patient still hypotensive with BP as low as 83/63.  EKG showed normal sinus rhythm specific ST/T changes. Chest x-ray showed lower lung volumes with patchy and veiling lung base opacity greater on the left. BNP elevated at 1,348. WBC 14.8, Hgb 9.8. Na 126, K 4.0, Glucose 108, BUN 26, Cr 1.39. Albumin 1.7, AST 57, ALT 31, Alk Phos 429, Total Bili 3.4. Lactic acid 1.9. Lipase 18. Code Sepsis was called and patient was started on antibiotics. However, there was also concern for potential cardiogenic shock. Therefore, Cardiology consulted.   At the time of  this evaluation, patient resting comfortable. Fiance at bedside. Patient and fiance state he has just been profoundly weak and fatigue for the last several days. Fiance states he has been sleeping a lot. However, no other real cardiac complaints. No chest pain, shortness of breath, orthopnea, PND. He had some mild lower extremity edema earlier this week but that has improved. No palpitations. He notes some mild lightheadedness/dizziness but no syncope. No fevers at  home but he has had a productive cough for a while. No other URI symptoms. No abdominal pain, nausea, or vomiting but he does have a poor appetite and poor PO intake. No abnormal bleeding in urine or stools on Eliquis.  Past Medical History:  Diagnosis Date   Arthritis    "hands, fingers" (02/03/2013)   Blood dyscrasia    "told I was a free bleeder when I was young; my blood does have trouble clotting" (02/03/2013)   Cancer ([place])    Coronary artery disease    s/p CABG   GERD (gastroesophageal reflux disease)    Hyperlipidemia    Hypertension    Myocardial infarct ([place]) 2000   Pneumonia    "couple times" (02/03/2013)    Past Surgical History:  Procedure Laterality Date   BILIARY DILATION  04/02/2019   Procedure: BILIARY DILATION;  Surgeon: Arta Silence, MD;  Location: [place] ENDOSCOPY;  Service: Endoscopy;;   BILIARY STENT PLACEMENT  04/02/2019   Procedure: BILIARY STENT PLACEMENT;  Surgeon: Arta Silence, MD;  Location: [place];  Service: Endoscopy;;   CORONARY ARTERY BYPASS GRAFT  2000   "CABG X5" (02/03/2013)   ERCP N/A 04/02/2019   Procedure: ENDOSCOPIC RETROGRADE CHOLANGIOPANCREATOGRAPHY (ERCP);  Surgeon: Arta Silence, MD;  Location: [facility] ENDOSCOPY;  Service: Endoscopy;  Laterality: N/A;   ESOPHAGOGASTRODUODENOSCOPY (EGD) WITH PROPOFOL N/A 04/02/2019   Procedure: ESOPHAGOGASTRODUODENOSCOPY (EGD) WITH PROPOFOL;  Surgeon: Arta Silence, MD;  Location: [place];  Service: Endoscopy;  Laterality: N/A;   EUS N/A 04/02/2019   Procedure: UPPER ENDOSCOPIC ULTRASOUND (EUS) LINEAR;  Surgeon: Arta Silence, MD;  Location: [place];  Service: Endoscopy;  Laterality: N/A;   FINE NEEDLE ASPIRATION  04/02/2019   Procedure: FINE NEEDLE ASPIRATION (FNA) LINEAR;  Surgeon: Arta Silence, MD;  Location: [place];  Service: Endoscopy;;   LEFT HEART CATH AND CORS/GRAFTS ANGIOGRAPHY N/A 06/02/2020   Procedure: LEFT HEART CATH AND CORS/GRAFTS ANGIOGRAPHY;  Surgeon: Nelva Bush, MD;   Location: [place] CV LAB;  Service: Cardiovascular;  Laterality: N/A;   LEFT HEART CATHETERIZATION WITH CORONARY/GRAFT ANGIOGRAM N/A 02/04/2013   Procedure: LEFT HEART CATHETERIZATION WITH Beatrix Fetters;  Surgeon: Candee Furbish, MD;  Location: [facility] CATH LAB;  Service: Cardiovascular;  Laterality: N/A;   PANCREATIC STENT PLACEMENT  04/02/2019   Procedure: PANCREATIC STENT PLACEMENT;  Surgeon: Arta Silence, MD;  Location: [facility] ENDOSCOPY;  Service: Endoscopy;;   SPHINCTEROTOMY  04/02/2019   Procedure: Joan Mayans;  Surgeon: Arta Silence, MD;  Location: [place] ENDOSCOPY;  Service: Endoscopy;;   TONSILLECTOMY  1950's     Home Medications:  Prior to Admission medications   Medication Sig Start Date End Date Taking? Authorizing Provider  aspirin EC 81 MG tablet Take 1 tablet (81 mg total) by mouth daily. 02/04/13   Jerline Pain, MD  atorvastatin (LIPITOR) 80 MG tablet Take 1 tablet (80 mg total) by mouth daily. 06/20/20   Kroeger, Lorelee Cover., PA-C  calcium carbonate (OS-CAL) 600 MG TABS tablet Take 600 mg by mouth daily.    [provider]  capecitabine (XELODA) 500 MG tablet daily  in the afternoon. 06/08/20   [provider]  clopidogrel (PLAVIX) 75 MG tablet TAKE 1 TABLET BY MOUTH DAILY WITH BREAKFAST. 09/06/20   Lelon Perla, MD  DULoxetine (CYMBALTA) 30 MG capsule Take 30 mg by mouth daily.    [provider]  ferrous sulfate 325 (65 FE) MG tablet Take 325 mg by mouth daily with breakfast. Patient not taking: Reported on 09/12/2020    [provider]  furosemide (LASIX) 20 MG tablet Take 1 tablet (20 mg total) by mouth daily. 08/01/20   Kroeger, Lorelee Cover., PA-C  isosorbide dinitrate (ISORDIL) 30 MG tablet Take 0.5 tablets (15 mg total) by mouth daily. 06/20/20   Kroeger, Lorelee Cover., PA-C  levothyroxine (SYNTHROID) 75 MCG tablet Take 75 mcg by mouth daily before breakfast.    [provider]  losartan (COZAAR) 25 MG tablet Take 1 tablet (25 mg  total) by mouth daily. 09/12/20   Lelon Perla, MD  Magnesium Oxide 400 MG CAPS Take 1 capsule (400 mg total) by mouth daily. 07/28/20   Kroeger, Lorelee Cover., PA-C  metoprolol succinate (TOPROL-XL) 50 MG 24 hr tablet Take 1 tablet (50 mg total) by mouth daily. Take with or immediately following a meal. 07/27/20 10/25/20  Kroeger, Lorelee Cover., PA-C  Multiple Vitamin (MULTIVITAMIN WITH MINERALS) TABS tablet Take 1 tablet by mouth daily.    [provider]  naloxone (NARCAN) nasal spray 4 mg/0.1 mL SPRAY 1 SPRAY INTO NOSTRIL ONCE FOR OPIOID OVERDOSE (TURN PERSON ON SIDE AFTER DOSE. IF NO RESPONSE IN 2-3 MINUTES OR PERSON RESPONDS BUT RELAPSES, REPEAT USING A NEW SPRAY DEVICE AND SPRAY INTO THE OTHER NOSTRIL. CALL 911 AFTER USE.) * EMERGENCY USE ONLY * 05/25/20   [provider]  nitroGLYCERIN (NITROSTAT) 0.4 MG SL tablet Place 1 tablet (0.4 mg total) under the tongue every 5 (five) minutes as needed for chest pain. 06/20/20   Kroeger, Lorelee Cover., PA-C  ondansetron (ZOFRAN) 8 MG tablet Take 1 tablet (8 mg total) by mouth every 8 (eight) hours as needed for nausea or vomiting. 07/15/20   Kyung Rudd, MD  oxyCODONE-acetaminophen (PERCOCET/ROXICET) 5-325 MG tablet TAKE 1 TABLET BY MOUTH EVERY 8 HOURS AS NEEDED FOR PAIN FOR UP TO 7 DAYS 05/19/20   [provider]  pantoprazole (PROTONIX) 40 MG tablet Take 1 tablet (40 mg total) by mouth daily. 06/20/20   Kroeger, Lorelee Cover., PA-C  potassium chloride (KLOR-CON) 10 MEQ tablet Take 1 tablet (10 mEq total) by mouth daily. 07/28/20 10/26/20  Kroeger, Lorelee Cover., PA-C  prochlorperazine (COMPAZINE) 10 MG tablet Take 1 tablet by mouth every 8 (eight) hours. 07/08/19   [provider]  spironolactone (ALDACTONE) 25 MG tablet Take 0.5 tablets (12.5 mg total) by mouth every other day. 06/09/20   Abigail Butts., PA-C    Inpatient Medications: Scheduled Meds:  Continuous Infusions:  ceFEPime (MAXIPIME) IV     lactated ringers Stopped (03/05/21  0730)   norepinephrine (LEVOPHED) Adult infusion     [START ON 03/06/2021] vancomycin     PRN Meds:   Allergies:    Allergies  Allergen Reactions   Shellfish Allergy Hives   Trazodone And Nefazodone     Unknown    Social History:   Social History   Socioeconomic History   Marital status: Divorced    Spouse name: Not on file   Number of children: Not on file   Years of education: Not on file   Highest education level: Not on file  Occupational History   Not on file  Tobacco Use   Smoking status: Former    Packs/day: 1.00    Years: 25.00    Pack years: 25.00    Types: Cigarettes   Smokeless tobacco: Never   Tobacco comments:    02/03/2013 "quit smoking ~ 25 yr ago"  Vaping Use   Vaping Use: Every day  Substance and Sexual Activity   Alcohol use: Not Currently    Comment: 02/03/2013 "stopped drinking alcohol in the 1990's; never had problem w/it"   Drug use: No   Sexual activity: Yes  Other Topics Concern   Not on file  Social History Narrative   Not on file   Social Determinants of Health   Financial Resource Strain: Low Risk    Difficulty of Paying Living Expenses: Not hard at all  Food Insecurity: No Food Insecurity   Worried About Charity fundraiser in the Last Year: Never true   [place] in the Last Year: Never true  Transportation Needs: No Transportation Needs   Lack of Transportation (Medical): No   Lack of Transportation (Non-Medical): No  Physical Activity: Not on file  Stress: Not on file  Social Connections: Not on file  Intimate Partner Violence: Not on file    Family History:   Family History  Problem Relation Age of Onset   Heart disease Mother    Heart disease Father      ROS:  Please see the history of present illness.  All other ROS reviewed and negative.     Physical Exam/Data:   Vitals:   03/05/21 1100 03/05/21 1115 03/05/21 1116 03/05/21 1130  BP: (!) 84/55 (!) 82/59 (!) 86/59 90/61  Pulse: (!) 55 (!) 58 (!) 57 (!)  57  Resp: '12 14 12 20  ' Temp:      TempSrc:      SpO2: 97% 97% 97% 96%  Weight:        Intake/Output Summary (Last 24 hours) at 03/05/2021 1205 Last data filed at 03/05/2021 1054 Gross per 24 hour  Intake 1468.76 ml  Output --  Net 1468.76 ml   Last 3 Weights 03/05/2021 09/12/2020 07/27/2020  Weight (lbs) 161 lb 170 lb 6.4 oz 176 lb  Weight (kg) 73.029 kg 77.293 kg 79.833 kg     Body mass index is 25.99 kg/m.  General: 75 y.o. thin Caucasian male resting comfortably in no acute distress. HEENT: Normocephalic and atraumatic.  Neck: Supple. Positive hepatojugular reflux. Heart: RRR. Distinct S1 and S2. Soft systolic murmur.  Lungs: No increased work of breathing. Decreased breath sounds in bases with some crackles bilaterally. Abdomen: Soft, non-distended, and non-tender to palpation. Bowel sounds present. Extremities: No lower extremity edema.    Skin: Warm and dry. Neuro: No focal deficits. Psych: Normal affect. Responds appropriately.  EKG:  The EKG was personally reviewed and demonstrates: Normal sinus rhythm, rate 84 bpm, with non-specific ST/T changes. Normal axis. Low voltage QRS. Qtc 560 ms on automatic read but 484 msec on my calculation using Framingham formula.  Telemetry:  Telemetry was personally reviewed and demonstrates: Sinus rhythm with rates in the 50s to 80s.   Relevant CV Studies:  Left Cardiac Catheterization 06/02/2020: Conclusions: Severe native coronary artery disease, including chronic total occlusions of the ostial LAD and OM1, as well as 60% and 80% ostial/proximal ramus lesions, 80% distal LCx stenosis, and diffuse mid RCA disease of up to 60-70% with heavy calcification.  Distal LCx lesion has worsened since  last catheterization in 2014; otherwise the has been no significant change. Widely patent LIMA-LAD, SVG-D2, and SVG-OM1. Atretic free RIMA-rPDA is unchanged since 2014. Low normal left ventricular filling pressure (LVEDP ~5 mmHg). Paroxysmal SVT  noted during left heart catheterization, which was self-limited and lasted < 5 minutes.   Recommendations: Aggressive medical therapy for mixed ischemic and nonischemic cardiomyopathy.  Severely reduced LVEF appears out of proportion to degree of coronary artery disease.  I will add carvedilol 3.125 mg twice daily. Maintain net even fluid balance. Dual antiplatelet therapy with aspirin and clopidogrel for up to 12 months, as tolerated. If the patient has recurrent symptoms, PCI to distal LCx could be considered, as well as functional assessment of RCA and ramus intermedius disease. Aggressive secondary prevention.  Diagnostic Dominance: Right  _______________  Echocardiogram 09/07/2020: Impressions:  1. Left ventricular ejection fraction, by estimation, is <20%. The left  ventricle has severely decreased function. The left ventricle demonstrates  global hypokinesis. The left ventricular internal cavity size was  moderately dilated. Left ventricular  diastolic parameters are indeterminate.   2. Right ventricular systolic function is normal. The right ventricular  size is normal.   3. Left atrial size was severely dilated.   4. Right atrial size was severely dilated.   5. The mitral valve is normal in structure. Moderate mitral valve  regurgitation. No evidence of mitral stenosis.   6. The aortic valve is normal in structure. Aortic valve regurgitation is  not visualized. No aortic stenosis is present.   7. The inferior vena cava is normal in size with greater than 50%  respiratory variability, suggesting right atrial pressure of 3 mmHg.   Comparison(s): No significant change from prior study. Prior images  reviewed side by side. 06/01/20 EF <20%.    Laboratory Data:  High Sensitivity Troponin:   Recent Labs  Lab 03/05/21 0647 03/05/21 0848  TROPONINIHS 1,125* 1,161*     Chemistry Recent Labs  Lab 03/05/21 0647 03/05/21 0820  NA 126* 124*  124*  K 4.0 4.0  4.0  CL 93*  92*  CO2 22  --   GLUCOSE 108* 106*  BUN 26* 31*  CREATININE 1.39* 1.40*  CALCIUM 7.6*  --   GFRNONAA 53*  --   ANIONGAP 11  --     Recent Labs  Lab 03/05/21 0647  PROT 4.7*  ALBUMIN 1.7*  AST 57*  ALT 31  ALKPHOS 429*  BILITOT 3.4*   Lipids No results for input(s): CHOL, TRIG, HDL, LABVLDL, LDLCALC, CHOLHDL in the last 168 hours.  Hematology Recent Labs  Lab 03/05/21 0647 03/05/21 0820  WBC 14.8*  --   RBC 3.17*  --   HGB 9.8* 10.5*  10.2*  HCT 28.2* 31.0*  30.0*  MCV 89.0  --   MCH 30.9  --   MCHC 34.8  --   RDW 18.0*  --   PLT PLATELET CLUMPS NOTED ON SMEAR, UNABLE TO ESTIMATE  --    Thyroid No results for input(s): TSH, FREET4 in the last 168 hours.  BNP Recent Labs  Lab 03/05/21 0648  BNP 1,348.3*    DDimer No results for input(s): DDIMER in the last 168 hours.   Radiology/Studies:  CT HEAD WO CONTRAST (5MM)  Result Date: 03/05/2021 CLINICAL DATA:  Delirium. EXAM: CT HEAD WITHOUT CONTRAST TECHNIQUE: Contiguous axial images were obtained from the base of the skull through the vertex without intravenous contrast. COMPARISON:  12/26/2019 FINDINGS: Brain: No evidence of acute infarction, hemorrhage, hydrocephalus, extra-axial collection,  or mass lesion/mass effect. Mild generalized cerebral atrophy and chronic small vessel disease show no significant change. Vascular:  No hyperdense vessel or other acute findings. Skull: No evidence of fracture or other significant bone abnormality. Sinuses/Orbits:  No acute findings. Other: None. IMPRESSION: No acute intracranial abnormality. Stable mild cerebral atrophy and chronic small vessel disease. Electronically Signed   By: Marlaine Hind M.D.   On: 03/05/2021 10:42   CT Angio Chest PE W and/or Wo Contrast  Result Date: 03/05/2021 CLINICAL DATA:  Sepsis. Nausea, vomiting, shortness of breath and chest pain. History of liver cancer. EXAM: CT ANGIOGRAPHY CHEST WITH CONTRAST TECHNIQUE: Multidetector CT imaging of the chest  was performed using the standard protocol during bolus administration of intravenous contrast. Multiplanar CT image reconstructions and MIPs were obtained to evaluate the vascular anatomy. CONTRAST:  141m OMNIPAQUE IOHEXOL 350 MG/ML SOLN COMPARISON:  CT chest 06/01/2020 FINDINGS: Cardiovascular: Satisfactory opacification of the pulmonary arteries to the segmental level. No evidence of pulmonary embolism. Enlarged left heart, stable compared to 06/01/2020. No pericardial effusion. Coronary artery stents and changes of coronary artery bypass graft. Mediastinum/Nodes: No enlarged mediastinal, hilar, or axillary lymph nodes. Punctate calcification noted in the left thyroid. The trachea and esophagus demonstrate no significant findings. Lungs/Pleura: Small right and small-moderate left pleural effusions with adjacent compressive atelectasis in the lower lobes. Subsegmental atelectasis versus scarring in the lingula. Multiple new subpleural nodules are noted in the right upper and middle lobes including: - 0.3 cm solid nodule in the right upper lobe (series 4, image 97) - 0.5 cm subsolid nodule in the right middle lobe (series 4, image 122) - 0.3 cm solid nodule in the right middle lobe (series 4, image 124) - 0.4 cm solid nodule in the right middle lobe (series 4, image 137 Upper Abdomen: Partially visualized hypodense hepatic masses, consistent with history of liver cancer. At least moderate volume of ascites is seen within the upper abdomen. No free intraperitoneal air in the visualized upper abdomen. Please see today's dedicated CT abdomen pelvis report for further details. Musculoskeletal: Interval vertebral body augmentation of the T7 and T8 compression fractures. Chronic deformity of the right humeral head. No acute osseous abnormality. No suspicious osseous lesion. Review of the MIP images confirms the above findings. IMPRESSION: 1. No pulmonary embolism. 2. Small right and small-moderate left pleural effusions  with adjacent compressive atelectasis in the lower lobes. 3. Multiple new solid and sub-solid subpleural nodules in the right upper and middle lobes. Favor infectious versus inflammatory etiology, though in the setting of known malignancy, findings are indeterminate. Recommend attention on follow-up imaging. 4. Interval vertebral body augmentation of the T7 and T8 compression fractures. 5. Ascites and multiple hepatic masses seen in the visualized upper abdomen. Please see today's dedicated CT abdomen and pelvis report for further details. Electronically Signed   By: LIleana RoupM.D.   On: 03/05/2021 11:04   CT ABDOMEN PELVIS W CONTRAST  Result Date: 03/05/2021 CLINICAL DATA:  History of pancreatic adenocarcinoma status post Whipple procedure. Nausea and vomiting. Sepsis. EXAM: CT ABDOMEN AND PELVIS WITH CONTRAST TECHNIQUE: Multidetector CT imaging of the abdomen and pelvis was performed using the standard protocol following bolus administration of intravenous contrast. CONTRAST:  1035mOMNIPAQUE IOHEXOL 350 MG/ML SOLN COMPARISON:  CT abdomen pelvis 05/25/2020 CT abdomen pelvis 03/31/2019 FINDINGS: Lower chest: Partially visualized bilateral pleural effusions with compressive atelectasis in the lower lobes. Enlarged left heart. Postoperative changes of CABG. Please see today's dedicated CT chest report for further details. Hepatobiliary:  A conglomerate of multi-septated hypodense masses occupies the majority of the left lobe of the liver spanning approximately 7 cm transverse x 8.7 cm AP x 10.6 cm craniocaudal dimension (series 6, image 29; series 10, image 59). An additional ill-defined hypodensity spanning 2.4 x 2.4 x 2.4 cm is noted in the hepatic segment V (series 6, image 27; series 10, image 38). Trace pneumobilia, consistent with history of Whipple procedure. Cholecystectomy. Pancreas: Postoperative changes of Whipple. There is moderate parenchymal atrophy in the pancreatic body and tail. The main  pancreatic duct is not dilated. Spleen: Borderline enlarged, measuring 12.6 cm in length. There are several small wedge-shaped areas of hypoenhancement at the superior and posterior aspects of the spleen, new from prior exam 05/25/2020 (series 6 images 11, 18, 21). Adrenals/Urinary Tract: Adrenal glands are unremarkable. Kidneys are normal, without renal calculi, focal lesion, or hydronephrosis. Bladder is unremarkable. Stomach/Bowel: Postoperative changes of Whipple procedure. Appendix appears normal. No bowel wall thickening or obstruction. Vascular/Lymphatic: An ill-defined 7.3 cm linear hypodensity spanning segment VI (series 6, image 28; series 9, image 55) likely represents the occluded posterior branch of the right portal vein. The left portal vein is patent but decreased in caliber compared to prior exam 05/25/2020. There is a focal narrowing of the main portal vein at the junction with the splenic vein which is new compared to 05/25/2020. SMV and splenic vein are patent. Vague soft tissue density surrounding the common hepatic artery and junction of the main portal vein and splenic vein (series 6, images 29, 30, 32). Reproductive: Prostate is enlarged with mass effect on the inferior wall of the urinary bladder. Other: Large volume simple abdominopelvic ascites. No free intra-abdominal air. Musculoskeletal: Stable compression fracture of the T12 vertebral body with approximately 50% vertebral body height loss. Advanced degenerative disc disease at L3-L4 with vacuum disc phenomenon. No acute or suspicious osseous finding. IMPRESSION: 1. A conglomerate of multi-septated hypodense masses occupies nearly the entirety of the left lobe of the liver. In the setting of sepsis, findings are concerning for hepatic abscesses. 2. Focal severe narrowing of the main portal vein at the confluence with the splenic vein and SMV. Vague soft tissue at the focal area of narrowing as well as surrounding the common hepatic  artery is concerning for recurrent malignancy. 3. 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 4. Multiple small wedge-shaped infarcts are noted in the superior and posterior aspects of the spleen. 5. Large volume simple abdominopelvic ascites. RAD These results were called by telephone at the time of interpretation on 03/05/2021 at 11:49 am to provider [facility] , who verbally acknowledged these results. Electronically Signed   By: Ileana Roup M.D.   On: 03/05/2021 11:51   DG Chest Port 1 View  Result Date: 03/05/2021 CLINICAL DATA:  75 year old male with possible sepsis. "Liver cancer." EXAM: PORTABLE CHEST 1 VIEW COMPARISON:  Portable chest 06/02/2020 and earlier. FINDINGS: Portable AP semi upright view at 0657 hours. Stable right chest Port-A-Cath. Stable mild cardiomegaly and mediastinal contours with prior CABG. Visualized tracheal air column is within normal limits. No pneumothorax or pulmonary edema. Lower lung volumes with patchy and veiling lung base opacity greater on the left. No air bronchograms. Negative visible bowel gas. Upper abdominal surgical clips. Chronic left 6th rib fracture. No acute osseous abnormality identified. IMPRESSION: Lower lung volumes with patchy and veiling lung base opacity greater on the left. Consider small pleural effusions with atelectasis. Lung base pneumonia not excluded. Electronically Signed  By: Genevie Ann M.D.   On: 03/05/2021 07:13     Assessment and Plan:   Weakness and Hypotension Chronic Combined CHF  Ischemic Cardiomyopathy - Patient presented with significant weakness and hypotension with systolic BP as low as 83.  - BNP elevated at 1,348.  - Chest x-ray showed lower lung volumes with patchy and veiling lung base opacity greater on the left. - Echo in 01/2021 at [facility] showed LVEF of 20-25%, mildly enlarged RV with mildly reduced systolic function, biatrial enlargement, mild to moderate MR, and mild TR. - There was  initially concern for cardiogenic shock but patient is warm on exam. Does not appear significantly volume overloaded. Lactic acid normal. Alk Phos and Total Bili are elevated but suspect this is due to metastatic cancer with liver involvement. AST only slightly elevated and ALT normal. He does have some AKI but suspect this is due to hypoperfusion with low BP. I don't think he is in Cardiogenic shock. I think this may be more from his metastatic cancer. - Will hold home Lasix, Toprol, and Spironolactone. - Will need to continue to monitor volume status closely.  CAD s/p Remote CABG - History of remote CABG x5 in 2000.  - No angina. - No longer on Aspirin and Plavix due to need for Eliquis.  - Will hold beta-blocker given low BP and concern for shock. - Continue statin.  Paroxysmal Atrial Fibrillation - Maintaining sinus rhythm.  - Would continue home Amiodarone taper. Looks like he is supposed.  - Continue Eliquis 62m twice daily.   [facility]  - Continue Eliquis.  Hyperlipidemia - Continue Lipitor 430mdaily.  AKI - Creatinine 1.39. Baseilne around 0.7 to 1.0. - Suspect secondary to hypoperfusion with low BP. - Will hold home antihypertensive. - Continue to monitor.  Hyponatremia  - Sodium 126 on admission.  - Patient does not appear volume overloaded on exam. Suspect this is due to poor PO intake. - Will defer management of this to primary team.  Normocytic Anemia - Hemoglobin 9.8 on admission. [facility] Required 2 units or PRBCs.  - Hemoccult negative. - Continue to monitor closely given patient is on Eliquis. - Management per primary team.  Stage IV Metastatic Pancreatic Cancer - S/p whipple in 04/2020 and then found to have recurrent metastatic cancer earlier this years with mets to the liver. He has been on palliative chemotherapy but has not had any treatment in at  least 1 month due to what sounds like low blood counts and significant weakness. - I asked patient and his fiance if they have had discussions with Oncology about prognosis and they said now. Fiance said they have not even officially been told what stage of cancer he has. Recommend palliative care consult for goals of care discussion.  Risk Assessment/Risk Scores:   New York Heart Association (NYHA) Functional Class Difficult to assess due to profound weakness from metastatic cancer   CHA2DS2-VASc Score = 7  his indicates a 11.2% annual risk of stroke. The patient's score is based upon: CHF History: 1 HTN History: 1 Diabetes History: 0 Stroke History: 2 (DVT) Vascular Disease History: 1 Age Score: 2 Gender Score: 0    For questions or updates, please contact [facility] Please consult [email or web address] for contact info under    Signed, CaDarreld McleanPA-C  03/05/2021 12:05 PM  History and all data above reviewed.  Patient examined.  I agree with the findings as above.  This unfortunate gentleman has pancreatic cancer as above with mets to the liver.   He has had two rounds of chemo but we don't have access to the therapies.  He has had surgery in Jan 2021.  He is unaware of the prognosis, stage or further treatment plans and is working with the [place] and awaiting follow up.  He was in the hospital at [facility] as above and he has been very week since getting out of the hospital.  However, on Wed of this week he had very progressive symptoms of fatigue, somnolence, cough of thick phlegm, anorexia.  He is not having weight gain or edema.  He has not had syncope but he did slip off the couch after a coughing paroxysm.  He has not had PND or orthopnea.  No fevers or chills.  Presented to the ED with fatigue.  Found to be hypotensive.  CT chest/abd with mets to the liver and nodules on the lung not clearly mets (question inflammation vs infectious, effusions).  We were called to consider  cardiogenic shock as an etiology of his hypotension.   The patient exam reveals COR:RRR  ,  Lungs: Decreased breath sounds at the bases  ,  Abd: Positive bowel sounds, no rebound no guarding, Ext No edema  General:  Chronically ill appearing and frail  .  All available labs, radiology testing, previous records reviewed. Agree with documented assessment and plan.   Hypotension:  I suspect that his acute decline is subacute and a manifestation of his metastatic pancreatic cancer.  Could not exclude an underlying lung process.  He does have cardiomyopathy and low output failure likely one component that contributes to a very poor [place] term prognosis and acute symptoms.  At this point I think we can hold his Lasix, Toprol and spiro.  Hold off on further hydration.  Low dose Epi if needed for BP support.  No plans for inotropes at this point.  I think we have to understand goals of therapy with his cancer which will likely include consultation with the [place] to understand previous therapy and plans.  Jeneen Rinks Brayln Duque  12:12 PM  03/05/2021

## 2021-03-05 NOTE — Progress Notes (Signed)
Elink monitoring for sepsis protocol 

## 2021-03-05 NOTE — Progress Notes (Signed)
eLink Physician-Brief Progress Note Patient Name: MERTON WADLOW DOB: Apr 26, 1946 MRN: [id number]   Date of Service  03/05/2021  HPI/Events of Note  Nausea - QTc interval = 0.56 seconds.   eICU Interventions  Plan: Compazine 10 mg IV Q 6 hours PRN N/V.     Intervention Category Major Interventions: Other:  Lysle Dingwall 03/05/2021, 9:03 PM

## 2021-03-05 NOTE — ED Triage Notes (Signed)
Pt BIB [facility] from home c/o weakness. Pt has a hx of liver cancer and went from treatment Wednesday but facility did not give to pt. Pt has been at home sitting on the couch ever since. Per family pt is normally A&Ox4. Pt is only alert to self at this time. Pt normally walks with no issues. Pt slide to the floor this morning from the couch and was unable to get up. Per EMS when they got there pt was hypotensive BP 90/40. Pt given a 500 NS bolus by EMS.   90/40 Confused  134 CBG 89% 90 HR A-fib with hx  100 Temp   18g Left forearm  500 NS

## 2021-03-05 NOTE — Progress Notes (Signed)
IR requested to evaluate patient for paracentesis and possible liver abscess aspiration with drain placement. ED physician notified of need for orders for both procedures. Dr. Kathlene Cote has reviewed the imaging and has approved patient for liver abscess aspiration with drain placement when INR is within normal limits. IR will perform formal consult 03/06/21. ED physician will make patient NPO.   Soyla Dryer, [place] [phone] 03/05/2021, 1:37 PM

## 2021-03-05 NOTE — ED Notes (Signed)
Per EMS they took pt's watch off, it was black and it was placed in pt's lap. This RN and tech checked pt's bed and cannot find a watch. Will continue to monitor.

## 2021-03-06 ENCOUNTER — Encounter (HOSPITAL_COMMUNITY): Payer: Self-pay | Admitting: Internal Medicine

## 2021-03-06 ENCOUNTER — Inpatient Hospital Stay (HOSPITAL_COMMUNITY): Payer: No Typology Code available for payment source

## 2021-03-06 DIAGNOSIS — A419 Sepsis, unspecified organism: Secondary | ICD-10-CM | POA: Diagnosis not present

## 2021-03-06 DIAGNOSIS — K750 Abscess of liver: Secondary | ICD-10-CM

## 2021-03-06 DIAGNOSIS — R7881 Bacteremia: Secondary | ICD-10-CM

## 2021-03-06 DIAGNOSIS — I5023 Acute on chronic systolic (congestive) heart failure: Secondary | ICD-10-CM

## 2021-03-06 HISTORY — PX: IR PARACENTESIS: IMG2679

## 2021-03-06 LAB — BODY FLUID CELL COUNT WITH DIFFERENTIAL
Eos, Fluid: 0 %
Lymphs, Fluid: 1 %
Monocyte-Macrophage-Serous Fluid: 3 % — ABNORMAL LOW (ref 50–90)
Neutrophil Count, Fluid: 96 % — ABNORMAL HIGH (ref 0–25)
Total Nucleated Cell Count, Fluid: 16500 cu mm — ABNORMAL HIGH (ref 0–1000)

## 2021-03-06 LAB — ALBUMIN, PLEURAL OR PERITONEAL FLUID: Albumin, Fluid: <1.5 g/dL

## 2021-03-06 LAB — COMPREHENSIVE METABOLIC PANEL
ALT: 27 U/L (ref 0–44)
AST: 40 U/L (ref 15–41)
Albumin: 1.5 g/dL — ABNORMAL LOW (ref 3.5–5.0)
Alkaline Phosphatase: 266 U/L — ABNORMAL HIGH (ref 38–126)
Anion gap: 13 (ref 5–15)
BUN: 33 mg/dL — ABNORMAL HIGH (ref 8–23)
CO2: 18 mmol/L — ABNORMAL LOW (ref 22–32)
Calcium: 7.5 mg/dL — ABNORMAL LOW (ref 8.9–10.3)
Chloride: 95 mmol/L — ABNORMAL LOW (ref 98–111)
Creatinine, Ser: 1.58 mg/dL — ABNORMAL HIGH (ref 0.61–1.24)
GFR, Estimated: 45 mL/min — ABNORMAL LOW (ref >60)
Glucose, Bld: 119 mg/dL — ABNORMAL HIGH (ref 70–99)
Potassium: 3.7 mmol/L (ref 3.5–5.1)
Sodium: 126 mmol/L — ABNORMAL LOW (ref 135–145)
Total Bilirubin: 3 mg/dL — ABNORMAL HIGH (ref 0.3–1.2)
Total Protein: 4.2 g/dL — ABNORMAL LOW (ref 6.5–8.1)

## 2021-03-06 LAB — GLUCOSE, PLEURAL OR PERITONEAL FLUID: Glucose, Fluid: 95 mg/dL

## 2021-03-06 LAB — TYPE AND SCREEN
ABO/RH(D): A NEG
Antibody Screen: NEGATIVE

## 2021-03-06 LAB — CBC
HCT: 33.4 % — ABNORMAL LOW (ref 39.0–52.0)
Hemoglobin: 11.8 g/dL — ABNORMAL LOW (ref 13.0–17.0)
MCH: 30.7 pg (ref 26.0–34.0)
MCHC: 35.3 g/dL (ref 30.0–36.0)
MCV: 87 fL (ref 80.0–100.0)
Platelets: 107 10*3/uL — ABNORMAL LOW (ref 150–400)
RBC: 3.84 MIL/uL — ABNORMAL LOW (ref 4.22–5.81)
RDW: 17.9 % — ABNORMAL HIGH (ref 11.5–15.5)
WBC: 6.8 10*3/uL (ref 4.0–10.5)
nRBC: 0 % (ref 0.0–0.2)

## 2021-03-06 LAB — LACTATE DEHYDROGENASE, PLEURAL OR PERITONEAL FLUID: LD, Fluid: 725 U/L — ABNORMAL HIGH (ref 3–23)

## 2021-03-06 LAB — GRAM STAIN

## 2021-03-06 LAB — PROCALCITONIN: Procalcitonin: 28.46 ng/mL

## 2021-03-06 LAB — CORTISOL: Cortisol, Plasma: 75.3 ug/dL

## 2021-03-06 LAB — GLUCOSE, CAPILLARY
Glucose-Capillary: 114 mg/dL — ABNORMAL HIGH (ref 70–99)
Glucose-Capillary: 144 mg/dL — ABNORMAL HIGH (ref 70–99)
Glucose-Capillary: 171 mg/dL — ABNORMAL HIGH (ref 70–99)
Glucose-Capillary: 171 mg/dL — ABNORMAL HIGH (ref 70–99)
Glucose-Capillary: 176 mg/dL — ABNORMAL HIGH (ref 70–99)

## 2021-03-06 LAB — PROTIME-INR
INR: 1.9 — ABNORMAL HIGH (ref 0.8–1.2)
Prothrombin Time: 21.7 seconds — ABNORMAL HIGH (ref 11.4–15.2)

## 2021-03-06 LAB — PROTEIN, PLEURAL OR PERITONEAL FLUID: Total protein, fluid: <3 g/dL

## 2021-03-06 LAB — ABO/RH: ABO/RH(D): A NEG

## 2021-03-06 MED ORDER — LACTATED RINGERS IV BOLUS
1000.0000 mL | Freq: Once | INTRAVENOUS | Status: AC
  Administered 2021-03-06: 1000 mL via INTRAVENOUS

## 2021-03-06 MED ORDER — DIGOXIN 0.25 MG/ML IJ SOLN
0.2500 mg | Freq: Once | INTRAMUSCULAR | Status: AC
  Administered 2021-03-06: 0.25 mg via INTRAVENOUS
  Filled 2021-03-06: qty 2

## 2021-03-06 MED ORDER — LIDOCAINE HCL 1 % IJ SOLN
INTRAMUSCULAR | Status: AC
  Filled 2021-03-06: qty 20

## 2021-03-06 MED ORDER — METRONIDAZOLE 500 MG/100ML IV SOLN
500.0000 mg | Freq: Two times a day (BID) | INTRAVENOUS | Status: DC
  Administered 2021-03-06 – 2021-03-07 (×3): 500 mg via INTRAVENOUS
  Filled 2021-03-06 (×3): qty 100

## 2021-03-06 MED ORDER — LIDOCAINE HCL (PF) 1 % IJ SOLN
INTRAMUSCULAR | Status: DC | PRN
  Administered 2021-03-06: 5 mL

## 2021-03-06 NOTE — Progress Notes (Signed)
eLink Physician-Brief Progress Note Patient Name: Isaiah Patterson DOB: 07/21/1945 MRN: [id number]   Date of Service  03/06/2021  HPI/Events of Note  Hypotension  Oliguria - Bladder scan with no urine residual.   eICU Interventions  Plan: Bolus with LR 1 liter IV over 1 hour now.      Intervention Category Major Interventions: Hypotension - evaluation and management  Gracelin Weisberg Eugene 03/06/2021, 3:12 AM

## 2021-03-06 NOTE — Progress Notes (Signed)
Progress Note  Patient Name: Isaiah Patterson Date of Encounter: 03/06/2021  Primary Cardiologist:   Kirk Ruths, MD   Subjective   He is fatigued but denies any pain or SOB.   Inpatient Medications    Scheduled Meds:  Chlorhexidine Gluconate Cloth  6 each Topical Daily   Continuous Infusions:  sodium chloride Stopped (03/06/21 0859)   ceFEPime (MAXIPIME) IV 2 g (03/06/21 1001)   metronidazole Stopped (03/06/21 0353)   norepinephrine (LEVOPHED) Adult infusion 8 mcg/min (03/06/21 1000)   PRN Meds: sodium chloride, fentaNYL (SUBLIMAZE) injection, lidocaine (PF), prochlorperazine   Vital Signs    Vitals:   03/06/21 0900 03/06/21 1000 03/06/21 1100 03/06/21 1137  BP: 112/73 (!) 101/58 (!) 87/51   Pulse: 84 70 70   Resp: (!) 24 20 17    Temp:    97.6 F (36.4 C)  TempSrc:    Oral  SpO2: 98% 97% 98%   Weight:      Height:        Intake/Output Summary (Last 24 hours) at 03/06/2021 1156 Last data filed at 03/06/2021 1000 Gross per 24 hour  Intake 1442.61 ml  Output --  Net 1442.61 ml   Filed Weights   03/05/21 0745 03/05/21 1545 03/06/21 0500  Weight: 73 kg 66.8 kg 64.1 kg    Telemetry    NSR - Personally Reviewed  ECG    NA - Personally Reviewed  Physical Exam   GEN:     Chronically and acutely ill appearing.   Neck: No  JVD Cardiac: RRR, no murmurs, rubs, or gallops.  Respiratory:      Decreased breath sounds at the bases.   GI: Soft, nontender, non-distended  MS: No  edema; No deformity. Neuro:  Nonfocal  Psych: Normal affect   Labs    Chemistry Recent Labs  Lab 03/05/21 0647 03/05/21 0820 03/06/21 0232  NA 126* 124*  124* 126*  K 4.0 4.0  4.0 3.7  CL 93* 92* 95*  CO2 22  --  18*  GLUCOSE 108* 106* 119*  BUN 26* 31* 33*  CREATININE 1.39* 1.40* 1.58*  CALCIUM 7.6*  --  7.5*  PROT 4.7*  --  4.2*  ALBUMIN 1.7*  --  1.5*  AST 57*  --  40  ALT 31  --  27  ALKPHOS 429*  --  266*  BILITOT 3.4*  --  3.0*  GFRNONAA 53*  --  45*   ANIONGAP 11  --  13     Hematology Recent Labs  Lab 03/05/21 0647 03/05/21 0820 03/06/21 0232  WBC 14.8*  --  6.8  RBC 3.17*  --  3.84*  HGB 9.8* 10.5*  10.2* 11.8*  HCT 28.2* 31.0*  30.0* 33.4*  MCV 89.0  --  87.0  MCH 30.9  --  30.7  MCHC 34.8  --  35.3  RDW 18.0*  --  17.9*  PLT PLATELET CLUMPS NOTED ON SMEAR, UNABLE TO ESTIMATE  --  107*    Cardiac EnzymesNo results for input(s): TROPONINI in the last 168 hours. No results for input(s): TROPIPOC in the last 168 hours.   BNP Recent Labs  Lab 03/05/21 0648  BNP 1,348.3*     DDimer No results for input(s): DDIMER in the last 168 hours.   Radiology    CT HEAD WO CONTRAST (5MM)  Result Date: 03/05/2021 CLINICAL DATA:  Delirium. EXAM: CT HEAD WITHOUT CONTRAST TECHNIQUE: Contiguous axial images were obtained from the base of the skull through  the vertex without intravenous contrast. COMPARISON:  12/26/2019 FINDINGS: Brain: No evidence of acute infarction, hemorrhage, hydrocephalus, extra-axial collection, or mass lesion/mass effect. Mild generalized cerebral atrophy and chronic small vessel disease show no significant change. Vascular:  No hyperdense vessel or other acute findings. Skull: No evidence of fracture or other significant bone abnormality. Sinuses/Orbits:  No acute findings. Other: None. IMPRESSION: No acute intracranial abnormality. Stable mild cerebral atrophy and chronic small vessel disease. Electronically Signed   By: Marlaine Hind M.D.   On: 03/05/2021 10:42   CT Angio Chest PE W and/or Wo Contrast  Result Date: 03/05/2021 CLINICAL DATA:  Sepsis. Nausea, vomiting, shortness of breath and chest pain. History of liver cancer. EXAM: CT ANGIOGRAPHY CHEST WITH CONTRAST TECHNIQUE: Multidetector CT imaging of the chest was performed using the standard protocol during bolus administration of intravenous contrast. Multiplanar CT image reconstructions and MIPs were obtained to evaluate the vascular anatomy. CONTRAST:   13mL OMNIPAQUE IOHEXOL 350 MG/ML SOLN COMPARISON:  CT chest 06/01/2020 FINDINGS: Cardiovascular: Satisfactory opacification of the pulmonary arteries to the segmental level. No evidence of pulmonary embolism. Enlarged left heart, stable compared to 06/01/2020. No pericardial effusion. Coronary artery stents and changes of coronary artery bypass graft. Mediastinum/Nodes: No enlarged mediastinal, hilar, or axillary lymph nodes. Punctate calcification noted in the left thyroid. The trachea and esophagus demonstrate no significant findings. Lungs/Pleura: Small right and small-moderate left pleural effusions with adjacent compressive atelectasis in the lower lobes. Subsegmental atelectasis versus scarring in the lingula. Multiple new subpleural nodules are noted in the right upper and middle lobes including: - 0.3 cm solid nodule in the right upper lobe (series 4, image 97) - 0.5 cm subsolid nodule in the right middle lobe (series 4, image 122) - 0.3 cm solid nodule in the right middle lobe (series 4, image 124) - 0.4 cm solid nodule in the right middle lobe (series 4, image 137 Upper Abdomen: Partially visualized hypodense hepatic masses, consistent with history of liver cancer. At least moderate volume of ascites is seen within the upper abdomen. No free intraperitoneal air in the visualized upper abdomen. Please see today's dedicated CT abdomen pelvis report for further details. Musculoskeletal: Interval vertebral body augmentation of the T7 and T8 compression fractures. Chronic deformity of the right humeral head. No acute osseous abnormality. No suspicious osseous lesion. Review of the MIP images confirms the above findings. IMPRESSION: 1. No pulmonary embolism. 2. Small right and small-moderate left pleural effusions with adjacent compressive atelectasis in the lower lobes. 3. Multiple new solid and sub-solid subpleural nodules in the right upper and middle lobes. Favor infectious versus inflammatory etiology,  though in the setting of known malignancy, findings are indeterminate. Recommend attention on follow-up imaging. 4. Interval vertebral body augmentation of the T7 and T8 compression fractures. 5. Ascites and multiple hepatic masses seen in the visualized upper abdomen. Please see today's dedicated CT abdomen and pelvis report for further details. Electronically Signed   By: Ileana Roup M.D.   On: 03/05/2021 11:04   CT ABDOMEN PELVIS W CONTRAST  Result Date: 03/05/2021 CLINICAL DATA:  History of pancreatic adenocarcinoma status post Whipple procedure. Nausea and vomiting. Sepsis. EXAM: CT ABDOMEN AND PELVIS WITH CONTRAST TECHNIQUE: Multidetector CT imaging of the abdomen and pelvis was performed using the standard protocol following bolus administration of intravenous contrast. CONTRAST:  160mL OMNIPAQUE IOHEXOL 350 MG/ML SOLN COMPARISON:  CT abdomen pelvis 05/25/2020 CT abdomen pelvis 03/31/2019 FINDINGS: Lower chest: Partially visualized bilateral pleural effusions with compressive atelectasis in the lower  lobes. Enlarged left heart. Postoperative changes of CABG. Please see today's dedicated CT chest report for further details. Hepatobiliary: A conglomerate of multi-septated hypodense masses occupies the majority of the left lobe of the liver spanning approximately 7 cm transverse x 8.7 cm AP x 10.6 cm craniocaudal dimension (series 6, image 29; series 10, image 59). An additional ill-defined hypodensity spanning 2.4 x 2.4 x 2.4 cm is noted in the hepatic segment V (series 6, image 27; series 10, image 38). Trace pneumobilia, consistent with history of Whipple procedure. Cholecystectomy. Pancreas: Postoperative changes of Whipple. There is moderate parenchymal atrophy in the pancreatic body and tail. The main pancreatic duct is not dilated. Spleen: Borderline enlarged, measuring 12.6 cm in length. There are several small wedge-shaped areas of hypoenhancement at the superior and posterior aspects of the  spleen, new from prior exam 05/25/2020 (series 6 images 11, 18, 21). Adrenals/Urinary Tract: Adrenal glands are unremarkable. Kidneys are normal, without renal calculi, focal lesion, or hydronephrosis. Bladder is unremarkable. Stomach/Bowel: Postoperative changes of Whipple procedure. Appendix appears normal. No bowel wall thickening or obstruction. Vascular/Lymphatic: An ill-defined 7.3 cm linear hypodensity spanning segment VI (series 6, image 28; series 9, image 55) likely represents the occluded posterior branch of the right portal vein. The left portal vein is patent but decreased in caliber compared to prior exam 05/25/2020. There is a focal narrowing of the main portal vein at the junction with the splenic vein which is new compared to 05/25/2020. SMV and splenic vein are patent. Vague soft tissue density surrounding the common hepatic artery and junction of the main portal vein and splenic vein (series 6, images 29, 30, 32). Reproductive: Prostate is enlarged with mass effect on the inferior wall of the urinary bladder. Other: Large volume simple abdominopelvic ascites. No free intra-abdominal air. Musculoskeletal: Stable compression fracture of the T12 vertebral body with approximately 50% vertebral body height loss. Advanced degenerative disc disease at L3-L4 with vacuum disc phenomenon. No acute or suspicious osseous finding. IMPRESSION: 1. A conglomerate of multi-septated hypodense masses occupies nearly the entirety of the left lobe of the liver. In the setting of sepsis, findings are concerning for hepatic abscesses. 2. Focal severe narrowing of the main portal vein at the confluence with the splenic vein and SMV. Vague soft tissue at the focal area of narrowing as well as surrounding the common hepatic artery is concerning for recurrent malignancy. 3. 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 4. Multiple small wedge-shaped infarcts are noted in the superior and posterior  aspects of the spleen. 5. Large volume simple abdominopelvic ascites. RAD These results were called by telephone at the time of interpretation on 03/05/2021 at 11:49 am to provider [facility] , who verbally acknowledged these results. Electronically Signed   By: Ileana Roup M.D.   On: 03/05/2021 11:51   DG Chest Port 1 View  Result Date: 03/05/2021 CLINICAL DATA:  75 year old male with possible sepsis. "Liver cancer." EXAM: PORTABLE CHEST 1 VIEW COMPARISON:  Portable chest 06/02/2020 and earlier. FINDINGS: Portable AP semi upright view at 0657 hours. Stable right chest Port-A-Cath. Stable mild cardiomegaly and mediastinal contours with prior CABG. Visualized tracheal air column is within normal limits. No pneumothorax or pulmonary edema. Lower lung volumes with patchy and veiling lung base opacity greater on the left. No air bronchograms. Negative visible bowel gas. Upper abdominal surgical clips. Chronic left 6th rib fracture. No acute osseous abnormality identified. IMPRESSION: Lower lung volumes with patchy and veiling lung base  opacity greater on the left. Consider small pleural effusions with atelectasis. Lung base pneumonia not excluded. Electronically Signed   By: Genevie Ann M.D.   On: 03/05/2021 07:13   IR Paracentesis  Result Date: 03/06/2021 INDICATION: History of pancreatic cancer, ascites seen on previous CT scan. Request for therapeutic and diagnostic paracentesis. EXAM: ULTRASOUND GUIDED  PARACENTESIS MEDICATIONS: 10 mL 1% lidocaine COMPLICATIONS: None immediate. PROCEDURE: Informed written consent was obtained from the patient after a discussion of the risks, benefits and alternatives to treatment. A timeout was performed prior to the initiation of the procedure. Initial ultrasound scanning demonstrates a large amount of ascites within the right lower abdominal quadrant. The right lower abdomen was prepped and draped in the usual sterile fashion. 1% lidocaine was used for local anesthesia.  Following this, a 19 gauge, 7-cm, Yueh catheter was introduced. An ultrasound image was saved for documentation purposes. The paracentesis was performed. The catheter was removed and a dressing was applied. The patient tolerated the procedure well without immediate post procedural complication. FINDINGS: A total of approximately 3.2 L of hazy yellow fluid was removed. Samples were sent to the laboratory as requested by the clinical team. IMPRESSION: Successful ultrasound-guided paracentesis yielding 3.2 liters of peritoneal fluid. Read by: Durenda Guthrie, PA-C Electronically Signed   By: Markus Daft M.D.   On: 03/06/2021 11:17    Cardiac Studies   NA  Patient Profile     75 y.o. male with a history of CAD s/p remote CABG x5 in 2000, chronic combined CHF with EF of <20%, paroxysmal atrial fibrillation on on Eliquis, multifocal atrial tachycardia, recent DVT on Eliquis, hypertension, hyperlipidemia, pancreatic cancer s/p whipple with recent recurrence of stage IV metastatic adenocarcinoma of pancreas on palliative chemotherapy who is being seen for evaluation of CHF at the request of Dr. Ivin Booty.  Assessment & Plan    Weakness and Hypotension Chronic Combined CHF  Ischemic Cardiomyopathy BNP elevated, some lung edema.   Much of his edema is likely third spacing and malnutrition.  However, suspect acute on chronic HF.   Not in a position to titrate meds or diurese with shock and hypotension.  He has been able to handle volume given for BP support and is still oxygenating well but will likely not handle much more volume loading.  Conservative management with Norepi BP support.  Severe ischemic CM contributes to overall poor prognosis.     CAD s/p Remote CABG Holding meds.   Elevated troponin.  Likely secondary to shock and demand ischemia rather than a primary contributor.  Regardless not a candidate for intervention or active therapy.    Paroxysmal Atrial Fibrillation NSR.  No change in therapy.    DVT Holding anticoagulation.  INR is elevated and he is to have hepatic drainage.    Hyperlipidemia Holding PO meds.    AKI Secondary to septic shock.     Hyponatremia  Sodium 126 again today.   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     Stage IV Metastatic Pancreatic Cancer Hospice consulted.  With evidence on CT of progressive metastatic cancer in addition to liver abscess.   Now with E Choli bacteremia.    We will see as needed.   For questions or updates, please contact [place] Please consult [email or web address] for contact info under Cardiology/STEMI.   Signed, Minus Breeding, MD  03/06/2021, 11:56 AM

## 2021-03-06 NOTE — Progress Notes (Signed)
eLink Physician-Brief Progress Note Patient Name: Isaiah Patterson DOB: 10-Dec-1945 MRN: [id number]   Date of Service  03/06/2021  HPI/Events of Note  Patient with atrial fibrillation with RVR, he is on a Norepinephrine gtt, EF on last  echocardiogram was < 20 %, QTC yesterday was 560.  eICU Interventions  Digoxin 0.25 mg iv x 1, wean Levophed as tolerated and discontinue it if MAP remains > 60 mmHg. Will check BMP and Mg+.        Kerry Kass Whittley Carandang 03/06/2021, 11:14 PM

## 2021-03-06 NOTE — Progress Notes (Signed)
Daily Progress Note   Patient Name: Isaiah Patterson       Date: 03/06/2021 DOB: 02/05/1946  Age: 75 y.o. MRN#: [id number] Attending Physician: Laurin Coder, MD Primary Care Physician: [facility] Admit Date: 03/05/2021  Reason for Consultation/Follow-up: Establishing goals of care  Subjective: Chart reviewed. Patient underwent paracentesis this morning with removal of 3.2 liters of fluid. Plan for liver abscess aspiration and possible drain placement tomorrow if INR is less than 1.5. He is to receive 2 units FFP today.   I went to bedside to see patient. Update received from his bedside RN). He is requiring vasopressor therapy (currently on levophed at 8 mcg). Patient is alert and interactive with his family today. He has no acute complaints. At bedside are his significant other [name] and his 2 daughters Lynelle Smoke and [name]. Brief discussion was had regarding home hospice versus residential hospice. At this time, family seems to be leaning toward residential hospice if patient is deemed eligible.    Length of Stay: 1  Current Medications: Scheduled Meds:  . Chlorhexidine Gluconate Cloth  6 each Topical Daily    Continuous Infusions: . sodium chloride 10 mL/hr at 03/06/21 1200  . ceFEPime (MAXIPIME) IV Stopped (03/06/21 1032)  . metronidazole 500 mg (03/06/21 1239)  . norepinephrine (LEVOPHED) Adult infusion 8 mcg/min (03/06/21 1200)    PRN Meds: sodium chloride, fentaNYL (SUBLIMAZE) injection, lidocaine (PF), prochlorperazine  Physical Exam Vitals reviewed.  Constitutional:      General: He is not in acute distress.    Appearance: He is ill-appearing.  Pulmonary:     Effort: Pulmonary effort is normal.  Neurological:     Mental Status: He is alert and oriented to  person, place, and time.     Motor: Weakness present.            Vital Signs: BP 99/63   Pulse 78   Temp 97.8 F (36.6 C) (Axillary)   Resp 15   Ht 5\' 6"  (1.676 m)   Wt 64.1 kg   SpO2 98%   BMI 22.81 kg/m  SpO2: SpO2: 98 % O2 Device: O2 Device: Nasal Cannula O2 Flow Rate: O2 Flow Rate (L/min): 2 L/min   LBM: Last BM Date: 03/05/21 Baseline Weight: Weight: 73 kg Most recent weight: Weight: 64.1 kg  Palliative Assessment/Data: PPS 30%      Palliative Care Assessment & Plan   HPI/Patient Profile: 75 y.o. 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 CT was concerning for hepatic abscess. Patient was admitted on 03/05/2021 with hypotension, aspiration pneumonitis, pulmonary nodules, AKI, stage IV metastatic adenocarcinoma of pancreas, protein calorie malnutrition/FTT.   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   Total Time 25 minutes Prolonged Time Billed  no       Greater than 50%  of this time was spent counseling and coordinating care related to the above assessment and plan.  Lavena Bullion, NP  Please  contact Palliative Medicine Team phone at [phone] for questions and concerns.

## 2021-03-06 NOTE — Progress Notes (Signed)
[employer] [facility]) Hospital Liaison: RN note    Hospital liaison received a request to provided information on hospice and inpatient hospice services from [place], [place]. Liaison spoke with patient's significant other, [name], to provided information and answer questions. She wants to review information and discuss with family.   A Please do not hesitate to call with questions.   Thank you,   Farrel Gordon, RN, [facility] Liaison   [phone]

## 2021-03-06 NOTE — Progress Notes (Signed)
NAME:  Isaiah Patterson, MRN:  [id number], DOB:  1945/07/08, LOS: 1 ADMISSION DATE:  03/05/2021, CONSULTATION DATE:  03/05/2021 REFERRING MD:  Dr. Ronnald Nian, CHIEF COMPLAINT:  weakness   History of Present Illness:  75 year old male with PMH as below, significant for but not limited to metastatic adenocarcinoma of pancreas stage IV and combined HF.    He is currently being treated at the [facility] for his metastatic pancreatic cancer.  Currently on palliative chemotherapy with gemcitabine and paclitaxel, however has not had chemo in the last month due to weakness and low blood counts.    Most recently, he was admitted at [facility] in October with afib with RVR, decompensated HF with EF <20%, and anemia found to have new RLE DVT, changed from DAPT to Eliquis.   Most of information provided by patient's fiance, [name] at bedside as he sleeps.  They have been together for 10 years and they live in [place].  He has two daughters, one lives locally.  [name] states that since hospital discharge, he has not been the same.   He sleeps most of the time, poor appetite with poor PO intake.  Generalized weakness, which has progressed to him sliding off the couch on Thursday and again this morning to where she could not get him up having to call EMS.  He has become so weak, he often coughs and chokes on food or drink.  He has not complained of any pain.  Noticed some confusion today.  Has had a cough, sometimes productive, and had an episode of chills last night with two possible low grade temps.  His blood pressure has been running lower, in which his hypertensive medications were recently decreased.  Denies any vomiting, diarrhea, or SOB.    In ER, afebrile 98.8, BP 87/56, NSR 84, rr 20, and 93% on room air.  Labs noted for WBC 14.8, Hgb 9.8, INR 2.4, BNP 1348, trop hs 1161, normal lactate 1.4, ammonia 16, Na 126, CL 93, BUN 26, sCr 1.39, albumin 1.7, AST 57, alk phos 429, t. Bili 3.4, protein 4.7, SARS/ flu negative.  ABG  7.569/ 27.9/ 113/ 25.5.   CXR with low lung volumes, patchy opacities, L>R, possible small effusions with atelectasis.  CTA chest, CT head, and CT abd/ pelvis imaging as below-  possible concerning for hepatic abscess in the setting of sepsis vs advanced malignancy.  EKG with NSR, prolonged Qtc (560), and no acute changes.  Empirically started on cefepime, flagyl, and vancomycin.  Given 2 LR bolus with some improvement in blood pressure but since has been started on low dose levophed.  Cardiology consulted given positive troponin and elevated BNP.  Pending surgery, IR, and palliative care consult by EDP.  PCCM asked to admit.   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 Cultures sent. IVFs administered. Started on Norepi. [place] 11/6 > mild cerebral atrophy and chronic small vessel disease, otherwise non acute.  CTA PE 11/6 > neg for PE, small right and small to moderate left pleural effusions with compressive atelectasis, multiple new solid and sub-solid subpleural nodules in RUL and RML, T7 and T8 compression fractures, ascites and multiple hepatic masses (see CT a/p) CT abd/pelvis 11/6 > Conglomerate of multi-septated hypodense masses occupying nearly the entirety of left lobe liver, may be concerning for hepatic abscesses in the setting of sepsis.  Focal severe narrowing of main portal vein at the confluence of the  splenic vein and SMV.  Vague soft tissue at the focal area of narrowing as well as surrounding the common hepatic artery concerning for recurrent malignany.  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  Multiple small wedge-shaped infarcts in the superior and posterior aspects of spleen.  Large volume simple abdominopelvic ascites. Seen by cards. Seen by [facility] family deciding of goals of care. DNR established.  11/7 GNR in blood. Vanc stopped. Hypotensive overnight. Got fluid. Treated for cancer related pain. Transfuse 2 units FFP for volume and treating coagulopathy   Interim History / Subjective:   BP req up.  C/o pain over bony areas of contact.  Objective   Blood pressure (Abnormal) 88/56, pulse 81, temperature 97.7 F (36.5 C), temperature source Oral, resp. rate 18, height '5\' 6"'  (1.676 m), weight 64.1 kg, SpO2 100 %.        Intake/Output Summary (Last 24 hours) at 03/06/2021 0738 Last data filed at 03/06/2021 0400 Gross per 24 hour  Intake 2685.9 ml  Output no documentation  Net 2685.9 ml    Filed Weights   03/05/21 0745 03/05/21 1545 03/06/21 0500  Weight: 73 kg 66.8 kg 64.1 kg   Examination:  General this is a debilitated/ malnourished 75 year old male who is lying in bed and in No acute distress HENT temporal wasting. MM are dry sclera non-icteric  Pulm dec bases no accessory use  Card RRR  Abd soft tender to palp RUQ  Ext warm trace LE edema pulses strong brisk CR  Neuro intact    Resolved Hospital Problem list    Assessment & Plan:   Septic shock due to GNR bacteremia from hepatic abscess +/- aspiration PNA. Suspect c/b hypovolemia  PCT actually rising Plan Day 2 cefepime/vanc and flagyl/ can dc vanc  Give 2 units FFP (will help w/ volume and INR) Titrate Noepi for MAP > 65 IR consulted for drainage of hepatic abscess.  Send cortisol   Suspected aspiration pneumonitis Pulmonary nodules Bilateral pulmonary effusions,R>L - nodules could be  metastasis vs inflammatory/ infectious given suspected aspiration Plan  Consider SLP eval  Supplemental oxygen Would only offer thora if felt would provide therapeutic benefit  Stage IV Metastatic Adenocarcinoma of Pancreas w/  Protein calorie malnutrition and Failure to thrive 2/2 advanced cancer S/p whipple 04/2020, radiation, and Folfirnox 01/2020, since found with metastases to liver, currently on palliative chemo, gemcitabine and paclitaxel, followed at [place] pending IR intervention Encourage PO intake Will consider RD consult   Cancer related pain Plan Treat w/ low dose fent  AKI - multifactorial, in the setting of hypotension and poor PO intake, was still taking diuretics at home , and s/p IV contrast dye on admit. Scr still climbing. UOP:  Plan Administering volume Avoid hypotension Stop vanc Renal dose meds Strict I&O AM chem  Coagulopathy 2/2 sepsis/hepatic abscess + liver mets; also was on DOAC INR better Plan Holding DOAC  Treating infection  FFP today   Hx combined HF, ICM,  CAD s/p remote CABG, HTN, HLD Positive troponin  Elevated BNP Prolonged QTc - Evaluated by cardiology.  Do not think this is acute decompensated HF/ cardiogenic shock Plan Holding diuretics, and lopressor  Avoid Qtc prolonging agents   PAF; On home Eliquis  - remains in [place] taper per cards Holding eliquis   RLE DVT (01/2021) Plan Holding DOAC  for IR procedure   Normocytic anemia hemoccult negative Plan  Trend cbc  Best Practice (right click and "Reselect all SmartList Selections" daily)   Diet/type: NPO DVT prophylaxis: SCD GI prophylaxis: N/A Lines: N/A, pta right chest port > accessed 11/6 > Foley:  N/A Code Status:  limited- DNR/ DNI, ok with abx/ pressors, and may be open to IR procedure Last date of multidisciplinary goals of care discussion: 11/6 w/ both critical care team and palliative (see both notes dated that day)     Critical care  time: 75 min    Erick Colace ACNP-BC [place] Pager # [phone] OR # [phone] if no answer

## 2021-03-06 NOTE — Procedures (Signed)
PROCEDURE SUMMARY:  Successful image-guided paracentesis from the right lower abdomen.  Yielded 3.2 L liters of hazy yellow fluid.  No immediate complications.  EBL = trace. Patient tolerated well.   Specimen was sent for labs.  Please see imaging section of Epic for full dictation.   Armando Gang Elie Leppo PA-C 03/06/2021 9:49 AM

## 2021-03-06 NOTE — Consult Note (Signed)
Chief Complaint: Patient was seen in consultation today for image guided liver abscess aspiration and possible drain placement Chief Complaint  Patient presents with   Weakness   at the request of Henderly, Britni PA-C  Referring Physician(s): Henderly, Britni PA-C  Supervising Physician: Markus Daft  Patient Status: [facility] - In-pt  History of Present Illness: Isaiah Patterson is a 75 y.o. male with PMHs of MI, CAD s/p CABG on Plavix, HTN, HLD, pancreatic cancer s/p Whipple procedure in January 2021 and s/p radiation therapy in June 3557, chronic systolic congestive heart failure and cardiomyopathy, who presented to [facility] ED on 03/05/2021 due to concern for sepsis. CT abdomen pelvis with contrast on 03/05/2021 showed:  1. A conglomerate of multi-septated hypodense masses occupies nearly the entirety of the left lobe of the liver. In the setting of sepsis, findings are concerning for hepatic abscesses. 2. Focal severe narrowing of the main portal vein at the confluence with the splenic vein and SMV. Vague soft tissue at the focal area of narrowing as well as surrounding the common hepatic artery is concerning for recurrent malignancy. 3. 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 4. Multiple small wedge-shaped infarcts are noted in the superior and posterior aspects of the spleen. 5. Large volume simple abdominopelvic ascites.  Patient was hospitalized for further evaluation management. IR was requested for image guided paracentesis and image guided liver abscess aspiration and possible drain placement. Case was reviewed and approved by Dr. Kathlene Cote; however, labs revealed INR 2.4.   Recommendation was made to obtain repeat INR to determine appropriate time for liver abscess aspiration and possible drain placement.  INR today 1.9, will repeat INR tomorrow and will proceed with liver lesion biopsy abscess aspiration and possible drain placement if INR less than  1.5.   Patient was seen in IR today when he was brought down for paracentesis.  Patient laying in bed, not in acute distress.  Denise headache, fever, chills, shortness of breath, cough, chest pain, abdominal pain, nausea ,vomiting, and bleeding.    Past Medical History:  Diagnosis Date   Arthritis    "hands, fingers" (02/03/2013)   Blood dyscrasia    "told I was a free bleeder when I was young; my blood does have trouble clotting" (02/03/2013)   Cancer ([place])    Coronary artery disease    s/p CABG   GERD (gastroesophageal reflux disease)    Hyperlipidemia    Hypertension    Myocardial infarct ([place]) 2000   Pneumonia    "couple times" (02/03/2013)    Past Surgical History:  Procedure Laterality Date   BILIARY DILATION  04/02/2019   Procedure: BILIARY DILATION;  Surgeon: Arta Silence, MD;  Location: [place] ENDOSCOPY;  Service: Endoscopy;;   BILIARY STENT PLACEMENT  04/02/2019   Procedure: BILIARY STENT PLACEMENT;  Surgeon: Arta Silence, MD;  Location: [place];  Service: Endoscopy;;   CORONARY ARTERY BYPASS GRAFT  2000   "CABG X5" (02/03/2013)   ERCP N/A 04/02/2019   Procedure: ENDOSCOPIC RETROGRADE CHOLANGIOPANCREATOGRAPHY (ERCP);  Surgeon: Arta Silence, MD;  Location: [facility] ENDOSCOPY;  Service: Endoscopy;  Laterality: N/A;   ESOPHAGOGASTRODUODENOSCOPY (EGD) WITH PROPOFOL N/A 04/02/2019   Procedure: ESOPHAGOGASTRODUODENOSCOPY (EGD) WITH PROPOFOL;  Surgeon: Arta Silence, MD;  Location: [place];  Service: Endoscopy;  Laterality: N/A;   EUS N/A 04/02/2019   Procedure: UPPER ENDOSCOPIC ULTRASOUND (EUS) LINEAR;  Surgeon: Arta Silence, MD;  Location: [place];  Service: Endoscopy;  Laterality: N/A;   FINE NEEDLE ASPIRATION  04/02/2019   Procedure: FINE NEEDLE ASPIRATION (FNA) LINEAR;  Surgeon: Arta Silence, MD;  Location: [place];  Service: Endoscopy;;   LEFT HEART CATH AND CORS/GRAFTS ANGIOGRAPHY N/A 06/02/2020   Procedure: LEFT HEART CATH AND CORS/GRAFTS  ANGIOGRAPHY;  Surgeon: Nelva Bush, MD;  Location: [place] CV LAB;  Service: Cardiovascular;  Laterality: N/A;   LEFT HEART CATHETERIZATION WITH CORONARY/GRAFT ANGIOGRAM N/A 02/04/2013   Procedure: LEFT HEART CATHETERIZATION WITH Beatrix Fetters;  Surgeon: Candee Furbish, MD;  Location[facility] C[facility] LAB;  Service: Cardiovascular;  Laterality: N/A;   PANCREATIC STENT PLACEMENT  04/02/2019   Procedure: PANCREATIC STENT PLACEMENT;  Surgeon: Arta Silence, MD;  Location: [facility] ENDOSCOPY;  Service: Endoscopy;;   SPHINCTEROTOMY  04/02/2019   Procedure: Joan Mayans;  Surgeon: Arta Silence, MD;  Location: [place] ENDOSCOPY;  Service: Endoscopy;;   TONSILLECTOMY  1950's    Allergies: Shellfish allergy and Trazodone and nefazodone  Medications: Prior to Admission medications   Medication Sig Start Date End Date Taking? Authorizing Provider  amiodarone (PACERONE) 400 MG tablet Take 400 mg by mouth daily.   Yes [provider]  apixaban (ELIQUIS) 5 MG TABS tablet Take 5 mg by mouth in the morning and at bedtime.   Yes [provider]  atorvastatin (LIPITOR) 40 MG tablet Take 40 mg by mouth at bedtime.   Yes [provider]  DULoxetine (CYMBALTA) 30 MG capsule Take 30 mg by mouth daily.   Yes [provider]  empagliflozin (JARDIANCE) 10 MG TABS tablet Take 10 mg by mouth daily.   Yes [provider]  ferrous sulfate 325 (65 FE) MG tablet Take 325 mg by mouth daily with breakfast.   Yes [provider]  Levothyroxine Sodium 37.5 MCG/ML SOLN Take 37.5 mcg by mouth daily before breakfast.   Yes [provider]  Magnesium Oxide 400 MG CAPS Take 1 capsule (400 mg total) by mouth daily. 07/28/20  Yes Kroeger, Daleen Snook M., PA-C  metoprolol succinate (TOPROL-XL) 25 MG 24 hr tablet Take 25 mg by mouth daily.   Yes [provider]  nitroGLYCERIN (NITROSTAT) 0.4 MG SL tablet Place 1 tablet (0.4 mg total) under the tongue every 5 (five)  minutes as needed for chest pain. 06/20/20  Yes Kroeger, Daleen Snook M., PA-C  ondansetron (ZOFRAN) 8 MG tablet Take 1 tablet (8 mg total) by mouth every 8 (eight) hours as needed for nausea or vomiting. 07/15/20  Yes Kyung Rudd, MD  Pancrelipase, Lip-Prot-Amyl, 24000-76000 units CPEP Take 24,000 Units by mouth in the morning, at noon, and at bedtime.   Yes [provider]  spironolactone (ALDACTONE) 25 MG tablet Take 0.5 tablets (12.5 mg total) by mouth every other day. Patient taking differently: Take 12.5 mg by mouth daily. 06/09/20  Yes Kroeger, Daleen Snook M., PA-C  tamsulosin (FLOMAX) 0.4 MG CAPS capsule Take 0.4 mg by mouth.   Yes [provider]  aspirin EC 81 MG tablet Take 1 tablet (81 mg total) by mouth daily. Patient not taking: No sig reported 02/04/13   Jerline Pain, MD  atorvastatin (LIPITOR) 80 MG tablet Take 1 tablet (80 mg total) by mouth daily. Patient not taking: No sig reported 06/20/20   Roby Lofts M., PA-C  clopidogrel (PLAVIX) 75 MG tablet TAKE 1 TABLET BY MOUTH DAILY WITH BREAKFAST. Patient not taking: No sig reported 09/06/20   Lelon Perla, MD  furosemide (LASIX) 20 MG tablet Take 1 tablet (20 mg total) by mouth daily. Patient not taking: No sig reported 08/01/20  Kroeger, Daleen Snook M., PA-C  isosorbide dinitrate (ISORDIL) 30 MG tablet Take 0.5 tablets (15 mg total) by mouth daily. Patient not taking: No sig reported 06/20/20   Roby Lofts M., PA-C  losartan (COZAAR) 25 MG tablet Take 1 tablet (25 mg total) by mouth daily. Patient not taking: No sig reported 09/12/20   Lelon Perla, MD  metoprolol succinate (TOPROL-XL) 50 MG 24 hr tablet Take 1 tablet (50 mg total) by mouth daily. Take with or immediately following a meal. Patient not taking: Reported on 03/05/2021 07/27/20 10/25/20  Abigail Butts., PA-C  pantoprazole (PROTONIX) 40 MG tablet Take 1 tablet (40 mg total) by mouth daily. Patient not taking: No sig reported 06/20/20   Roby Lofts M.,  PA-C  potassium chloride (KLOR-CON) 10 MEQ tablet Take 1 tablet (10 mEq total) by mouth daily. 07/28/20 10/26/20  Kroeger, Lorelee Cover., PA-C     Family History  Problem Relation Age of Onset   Heart disease Mother    Heart disease Father     Social History   Socioeconomic History   Marital status: Divorced    Spouse name: Not on file   Number of children: Not on file   Years of education: Not on file   Highest education level: Not on file  Occupational History   Not on file  Tobacco Use   Smoking status: Former    Packs/day: 1.00    Years: 25.00    Pack years: 25.00    Types: Cigarettes   Smokeless tobacco: Never   Tobacco comments:    02/03/2013 "quit smoking ~ 25 yr ago"  Vaping Use   Vaping Use: Every day  Substance and Sexual Activity   Alcohol use: Not Currently    Comment: 02/03/2013 "stopped drinking alcohol in the 1990's; never had problem w/it"   Drug use: No   Sexual activity: Yes  Other Topics Concern   Not on file  Social History Narrative   Not on file   Social Determinants of Health   Financial Resource Strain: Low Risk    Difficulty of Paying Living Expenses: Not hard at all  Food Insecurity: No Food Insecurity   Worried About Charity fundraiser in the Last Year: Never true   [place] in the Last Year: Never true  Transportation Needs: No Transportation Needs   Lack of Transportation (Medical): No   Lack of Transportation (Non-Medical): No  Physical Activity: Not on file  Stress: Not on file  Social Connections: Not on file     Review of Systems: A 12 point ROS discussed and pertinent positives are indicated in the HPI above.  All other systems are negative.  Vital Signs: BP 94/65 (BP Location: Left Arm)   Pulse 87   Temp 97.7 F (36.5 C) (Oral)   Resp (!) 21   Ht 5\' 6"  (1.676 m)   Wt 141 lb 5 oz (64.1 kg)   SpO2 98%   BMI 22.81 kg/m    Physical Exam Vitals reviewed.  Constitutional:      General: He is not in acute  distress.    Appearance: He is ill-appearing.  HENT:     Head: Normocephalic and atraumatic.     Mouth/Throat:     Mouth: Mucous membranes are moist.  Cardiovascular:     Rate and Rhythm: Normal rate and regular rhythm.     Heart sounds: Normal heart sounds.  Pulmonary:     Effort: Pulmonary effort is normal.  Breath sounds: Normal breath sounds.     Comments: On O2 via [place] Abdominal:     General: Abdomen is flat. Bowel sounds are normal.     Palpations: Abdomen is soft.  Musculoskeletal:     Cervical back: Normal range of motion and neck supple.  Skin:    General: Skin is warm and dry.     Coloration: Skin is not jaundiced.  Neurological:     Mental Status: He is oriented to person, place, and time.  Psychiatric:        Mood and Affect: Mood normal.        Behavior: Behavior normal.        Judgment: Judgment normal.    MD Evaluation Airway: WNL Heart: WNL Abdomen: WNL Chest/ Lungs: WNL ASA  Classification: 3 Mallampati/Airway Score: One  Imaging: CT HEAD WO CONTRAST (5MM)  Result Date: 03/05/2021 CLINICAL DATA:  Delirium. EXAM: CT HEAD WITHOUT CONTRAST TECHNIQUE: Contiguous axial images were obtained from the base of the skull through the vertex without intravenous contrast. COMPARISON:  12/26/2019 FINDINGS: Brain: No evidence of acute infarction, hemorrhage, hydrocephalus, extra-axial collection, or mass lesion/mass effect. Mild generalized cerebral atrophy and chronic small vessel disease show no significant change. Vascular:  No hyperdense vessel or other acute findings. Skull: No evidence of fracture or other significant bone abnormality. Sinuses/Orbits:  No acute findings. Other: None. IMPRESSION: No acute intracranial abnormality. Stable mild cerebral atrophy and chronic small vessel disease. Electronically Signed   By: Marlaine Hind M.D.   On: 03/05/2021 10:42   CT Angio Chest PE W and/or Wo Contrast  Result Date: 03/05/2021 CLINICAL DATA:  Sepsis. Nausea,  vomiting, shortness of breath and chest pain. History of liver cancer. EXAM: CT ANGIOGRAPHY CHEST WITH CONTRAST TECHNIQUE: Multidetector CT imaging of the chest was performed using the standard protocol during bolus administration of intravenous contrast. Multiplanar CT image reconstructions and MIPs were obtained to evaluate the vascular anatomy. CONTRAST:  169mL OMNIPAQUE IOHEXOL 350 MG/ML SOLN COMPARISON:  CT chest 06/01/2020 FINDINGS: Cardiovascular: Satisfactory opacification of the pulmonary arteries to the segmental level. No evidence of pulmonary embolism. Enlarged left heart, stable compared to 06/01/2020. No pericardial effusion. Coronary artery stents and changes of coronary artery bypass graft. Mediastinum/Nodes: No enlarged mediastinal, hilar, or axillary lymph nodes. Punctate calcification noted in the left thyroid. The trachea and esophagus demonstrate no significant findings. Lungs/Pleura: Small right and small-moderate left pleural effusions with adjacent compressive atelectasis in the lower lobes. Subsegmental atelectasis versus scarring in the lingula. Multiple new subpleural nodules are noted in the right upper and middle lobes including: - 0.3 cm solid nodule in the right upper lobe (series 4, image 97) - 0.5 cm subsolid nodule in the right middle lobe (series 4, image 122) - 0.3 cm solid nodule in the right middle lobe (series 4, image 124) - 0.4 cm solid nodule in the right middle lobe (series 4, image 137 Upper Abdomen: Partially visualized hypodense hepatic masses, consistent with history of liver cancer. At least moderate volume of ascites is seen within the upper abdomen. No free intraperitoneal air in the visualized upper abdomen. Please see today's dedicated CT abdomen pelvis report for further details. Musculoskeletal: Interval vertebral body augmentation of the T7 and T8 compression fractures. Chronic deformity of the right humeral head. No acute osseous abnormality. No suspicious  osseous lesion. Review of the MIP images confirms the above findings. IMPRESSION: 1. No pulmonary embolism. 2. Small right and small-moderate left pleural effusions with adjacent compressive atelectasis in  the lower lobes. 3. Multiple new solid and sub-solid subpleural nodules in the right upper and middle lobes. Favor infectious versus inflammatory etiology, though in the setting of known malignancy, findings are indeterminate. Recommend attention on follow-up imaging. 4. Interval vertebral body augmentation of the T7 and T8 compression fractures. 5. Ascites and multiple hepatic masses seen in the visualized upper abdomen. Please see today's dedicated CT abdomen and pelvis report for further details. Electronically Signed   By: Ileana Roup M.D.   On: 03/05/2021 11:04   CT ABDOMEN PELVIS W CONTRAST  Result Date: 03/05/2021 CLINICAL DATA:  History of pancreatic adenocarcinoma status post Whipple procedure. Nausea and vomiting. Sepsis. EXAM: CT ABDOMEN AND PELVIS WITH CONTRAST TECHNIQUE: Multidetector CT imaging of the abdomen and pelvis was performed using the standard protocol following bolus administration of intravenous contrast. CONTRAST:  159mL OMNIPAQUE IOHEXOL 350 MG/ML SOLN COMPARISON:  CT abdomen pelvis 05/25/2020 CT abdomen pelvis 03/31/2019 FINDINGS: Lower chest: Partially visualized bilateral pleural effusions with compressive atelectasis in the lower lobes. Enlarged left heart. Postoperative changes of CABG. Please see today's dedicated CT chest report for further details. Hepatobiliary: A conglomerate of multi-septated hypodense masses occupies the majority of the left lobe of the liver spanning approximately 7 cm transverse x 8.7 cm AP x 10.6 cm craniocaudal dimension (series 6, image 29; series 10, image 59). An additional ill-defined hypodensity spanning 2.4 x 2.4 x 2.4 cm is noted in the hepatic segment V (series 6, image 27; series 10, image 38). Trace pneumobilia, consistent with history of  Whipple procedure. Cholecystectomy. Pancreas: Postoperative changes of Whipple. There is moderate parenchymal atrophy in the pancreatic body and tail. The main pancreatic duct is not dilated. Spleen: Borderline enlarged, measuring 12.6 cm in length. There are several small wedge-shaped areas of hypoenhancement at the superior and posterior aspects of the spleen, new from prior exam 05/25/2020 (series 6 images 11, 18, 21). Adrenals/Urinary Tract: Adrenal glands are unremarkable. Kidneys are normal, without renal calculi, focal lesion, or hydronephrosis. Bladder is unremarkable. Stomach/Bowel: Postoperative changes of Whipple procedure. Appendix appears normal. No bowel wall thickening or obstruction. Vascular/Lymphatic: An ill-defined 7.3 cm linear hypodensity spanning segment VI (series 6, image 28; series 9, image 55) likely represents the occluded posterior branch of the right portal vein. The left portal vein is patent but decreased in caliber compared to prior exam 05/25/2020. There is a focal narrowing of the main portal vein at the junction with the splenic vein which is new compared to 05/25/2020. SMV and splenic vein are patent. Vague soft tissue density surrounding the common hepatic artery and junction of the main portal vein and splenic vein (series 6, images 29, 30, 32). Reproductive: Prostate is enlarged with mass effect on the inferior wall of the urinary bladder. Other: Large volume simple abdominopelvic ascites. No free intra-abdominal air. Musculoskeletal: Stable compression fracture of the T12 vertebral body with approximately 50% vertebral body height loss. Advanced degenerative disc disease at L3-L4 with vacuum disc phenomenon. No acute or suspicious osseous finding. IMPRESSION: 1. A conglomerate of multi-septated hypodense masses occupies nearly the entirety of the left lobe of the liver. In the setting of sepsis, findings are concerning for hepatic abscesses. 2. Focal severe narrowing of the  main portal vein at the confluence with the splenic vein and SMV. Vague soft tissue at the focal area of narrowing as well as surrounding the common hepatic artery is concerning for recurrent malignancy. 3. 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 4. Multiple  small wedge-shaped infarcts are noted in the superior and posterior aspects of the spleen. 5. Large volume simple abdominopelvic ascites. RAD These results were called by telephone at the time of interpretation on 03/05/2021 at 11:49 am to provider [facility] , who verbally acknowledged these results. Electronically Signed   By: Ileana Roup M.D.   On: 03/05/2021 11:51   DG Chest Port 1 View  Result Date: 03/05/2021 CLINICAL DATA:  75 year old male with possible sepsis. "Liver cancer." EXAM: PORTABLE CHEST 1 VIEW COMPARISON:  Portable chest 06/02/2020 and earlier. FINDINGS: Portable AP semi upright view at 0657 hours. Stable right chest Port-A-Cath. Stable mild cardiomegaly and mediastinal contours with prior CABG. Visualized tracheal air column is within normal limits. No pneumothorax or pulmonary edema. Lower lung volumes with patchy and veiling lung base opacity greater on the left. No air bronchograms. Negative visible bowel gas. Upper abdominal surgical clips. Chronic left 6th rib fracture. No acute osseous abnormality identified. IMPRESSION: Lower lung volumes with patchy and veiling lung base opacity greater on the left. Consider small pleural effusions with atelectasis. Lung base pneumonia not excluded. Electronically Signed   By: Genevie Ann M.D.   On: 03/05/2021 07:13    Labs:  CBC: Recent Labs    06/02/20 0303 06/03/20 0004 03/05/21 0647 03/05/21 0820 03/06/21 0232  WBC 7.7 7.7 14.8*  --  6.8  HGB 11.8* 11.5* 9.8* 10.5*  10.2* 11.8*  HCT 34.5* 35.8* 28.2* 31.0*  30.0* 33.4*  PLT 267 372 PLATELET CLUMPS NOTED ON SMEAR, UNABLE TO ESTIMATE  --  107*    COAGS: Recent Labs    03/05/21 0647  03/06/21 0232  INR 2.4* 1.9*  APTT 33  --     BMP: Recent Labs    06/09/20 1215 06/20/20 0939 07/27/20 1532 08/19/20 1009 03/05/21 0647 03/05/21 0820 03/06/21 0232  NA 138 142 143 145* 126* 124*  124* 126*  K 5.6* 4.4 3.4* 3.8 4.0 4.0  4.0 3.7  CL 100 105 104 107* 93* 92* 95*  CO2 22 25 25 22 22   --  18*  GLUCOSE 104* 99 113* 103* 108* 106* 119*  BUN 16 13 6* 8 26* 31* 33*  CALCIUM 9.6 9.2 8.7 9.0 7.6*  --  7.5*  CREATININE 1.06 1.07 0.67* 0.79 1.39* 1.40* 1.58*  GFRNONAA 68 68  --   --  53*  --  45*  GFRAA 79 78  --   --   --   --   --     LIVER FUNCTION TESTS: Recent Labs    06/01/20 0114 06/02/20 0303 03/05/21 0647 03/06/21 0232  BILITOT 1.0 1.0 3.4* 3.0*  AST 47* 47* 57* 40  ALT 31 37 31 27  ALKPHOS 151* 163* 429* 266*  PROT 5.9* 5.6* 4.7* 4.2*  ALBUMIN 3.0* 2.8* 1.7* 1.5*    TUMOR MARKERS: No results for input(s): AFPTM, CEA, CA199, CHROMGRNA in the last 8760 hours.  Assessment and Plan: 75 y.o. male currently hospitalized due to sepsis, with liver lesions seen on CT concerning for abscesses.   IR was requested for image guided paracentesis and image guided liver abscess aspiration and possible drain placement. Case was reviewed and approved by Dr. Kathlene Cote; however, labs revealed INR 2.4.  Recommendation was made to obtain repeat INR to determine appropriate time for liver abscess aspiration and possible drain placement.  INR today is 1.9, will repeat INR tomorrow and will proceed with liver lesion biopsy abscess aspiration and possible drain placement if INR less  than 1.5.   Procedure is tentatively scheduled for tomorrow pending INR and IR schedule. NPO since yesterday  VSS on levophed  CBC hgb 11.8, plt 107,  INR 1.9, not on AC/PA in house - repeat INR for tomorrow AM ordered.  Risks and benefits discussed with the patient and his significant other, Miss Gentry Roch including bleeding, infection, damage to adjacent structures, bowel  perforation/fistula connection, and sepsis.  All of the patient and Miss Love's questions were answered, they are  agreeable to proceed. Consent signed and in IR binder.    Thank you for this interesting consult.  I greatly enjoyed meeting Isaiah Patterson and look forward to participating in their care.  A copy of this report was sent to the requesting provider on this date.  Electronically Signed: Tera Mater, PA-C 03/06/2021, 9:38 AM   I spent a total of 40 Minutes    in face to face in clinical consultation, greater than 50% of which was counseling/coordinating care for liver abscess aspiration and drain placement.   This chart was dictated using voice recognition software.  Despite best efforts to proofread,  errors can occur which can change the documentation meaning.

## 2021-03-06 NOTE — Progress Notes (Signed)
PHARMACY - PHYSICIAN COMMUNICATION CRITICAL VALUE ALERT - BLOOD CULTURE IDENTIFICATION (BCID)  Isaiah Patterson is an 74 y.o. male who presented to [facility] on 03/05/2021 with a chief complaint of ?liver abscess, pancreatic CA  Assessment: Tachycardic, hypotensive  Name of physician (or Provider) Contacted: Dr. Oletta Darter  Current antibiotics: Vancomycin/Cefepime/Flagyl  Changes to prescribed antibiotics recommended:  No changes for now  Results for orders placed or performed during the hospital encounter of 03/05/21  Blood Culture ID Panel (Reflexed) (Collected: 03/05/2021  7:03 AM)  Result Value Ref Range   Enterococcus faecalis NOT DETECTED NOT DETECTED   Enterococcus Faecium NOT DETECTED NOT DETECTED   Listeria monocytogenes NOT DETECTED NOT DETECTED   Staphylococcus species NOT DETECTED NOT DETECTED   Staphylococcus aureus (BCID) NOT DETECTED NOT DETECTED   Staphylococcus epidermidis NOT DETECTED NOT DETECTED   Staphylococcus lugdunensis NOT DETECTED NOT DETECTED   Streptococcus species NOT DETECTED NOT DETECTED   Streptococcus agalactiae NOT DETECTED NOT DETECTED   Streptococcus pneumoniae NOT DETECTED NOT DETECTED   Streptococcus pyogenes NOT DETECTED NOT DETECTED   A.calcoaceticus-baumannii NOT DETECTED NOT DETECTED   Bacteroides fragilis NOT DETECTED NOT DETECTED   Enterobacterales DETECTED (A) NOT DETECTED   Enterobacter cloacae complex NOT DETECTED NOT DETECTED   Escherichia coli DETECTED (A) NOT DETECTED   Klebsiella aerogenes NOT DETECTED NOT DETECTED   Klebsiella oxytoca NOT DETECTED NOT DETECTED   Klebsiella pneumoniae NOT DETECTED NOT DETECTED   Proteus species NOT DETECTED NOT DETECTED   Salmonella species NOT DETECTED NOT DETECTED   Serratia marcescens NOT DETECTED NOT DETECTED   Haemophilus influenzae NOT DETECTED NOT DETECTED   Neisseria meningitidis NOT DETECTED NOT DETECTED   Pseudomonas aeruginosa NOT DETECTED NOT DETECTED   Stenotrophomonas maltophilia NOT  DETECTED NOT DETECTED   Candida albicans NOT DETECTED NOT DETECTED   Candida auris NOT DETECTED NOT DETECTED   Candida glabrata NOT DETECTED NOT DETECTED   Candida krusei NOT DETECTED NOT DETECTED   Candida parapsilosis NOT DETECTED NOT DETECTED   Candida tropicalis NOT DETECTED NOT DETECTED   Cryptococcus neoformans/gattii NOT DETECTED NOT DETECTED   CTX-M ESBL NOT DETECTED NOT DETECTED   Carbapenem resistance IMP NOT DETECTED NOT DETECTED   Carbapenem resistance KPC NOT DETECTED NOT DETECTED   Carbapenem resistance NDM NOT DETECTED NOT DETECTED   Carbapenem resist OXA 48 LIKE NOT DETECTED NOT DETECTED   Carbapenem resistance VIM NOT DETECTED NOT DETECTED    Narda Bonds 03/06/2021  12:42 AM

## 2021-03-07 DIAGNOSIS — R6521 Severe sepsis with septic shock: Secondary | ICD-10-CM | POA: Diagnosis not present

## 2021-03-07 DIAGNOSIS — A419 Sepsis, unspecified organism: Secondary | ICD-10-CM

## 2021-03-07 LAB — BPAM FFP
Blood Product Expiration Date: 202211112359
Blood Product Expiration Date: 202211112359
ISSUE DATE / TIME: 202211071253
ISSUE DATE / TIME: 202211071253
Unit Type and Rh: 6200
Unit Type and Rh: 6200

## 2021-03-07 LAB — COMPREHENSIVE METABOLIC PANEL
ALT: 22 U/L (ref 0–44)
AST: 22 U/L (ref 15–41)
Albumin: <1.5 g/dL — ABNORMAL LOW (ref 3.5–5.0)
Alkaline Phosphatase: 230 U/L — ABNORMAL HIGH (ref 38–126)
Anion gap: 10 (ref 5–15)
BUN: 41 mg/dL — ABNORMAL HIGH (ref 8–23)
CO2: 20 mmol/L — ABNORMAL LOW (ref 22–32)
Calcium: 7.5 mg/dL — ABNORMAL LOW (ref 8.9–10.3)
Chloride: 95 mmol/L — ABNORMAL LOW (ref 98–111)
Creatinine, Ser: 1.53 mg/dL — ABNORMAL HIGH (ref 0.61–1.24)
GFR, Estimated: 47 mL/min — ABNORMAL LOW (ref >60)
Glucose, Bld: 145 mg/dL — ABNORMAL HIGH (ref 70–99)
Potassium: 3.6 mmol/L (ref 3.5–5.1)
Sodium: 125 mmol/L — ABNORMAL LOW (ref 135–145)
Total Bilirubin: 2.4 mg/dL — ABNORMAL HIGH (ref 0.3–1.2)
Total Protein: 3.9 g/dL — ABNORMAL LOW (ref 6.5–8.1)

## 2021-03-07 LAB — CBC
HCT: 26.4 % — ABNORMAL LOW (ref 39.0–52.0)
Hemoglobin: 9.6 g/dL — ABNORMAL LOW (ref 13.0–17.0)
MCH: 31.3 pg (ref 26.0–34.0)
MCHC: 36.4 g/dL — ABNORMAL HIGH (ref 30.0–36.0)
MCV: 86 fL (ref 80.0–100.0)
Platelets: 62 10*3/uL — ABNORMAL LOW (ref 150–400)
RBC: 3.07 MIL/uL — ABNORMAL LOW (ref 4.22–5.81)
RDW: 17.7 % — ABNORMAL HIGH (ref 11.5–15.5)
WBC: 11.4 10*3/uL — ABNORMAL HIGH (ref 4.0–10.5)
nRBC: 0 % (ref 0.0–0.2)

## 2021-03-07 LAB — PREPARE FRESH FROZEN PLASMA
Unit division: 0
Unit division: 0

## 2021-03-07 LAB — GLUCOSE, CAPILLARY
Glucose-Capillary: 158 mg/dL — ABNORMAL HIGH (ref 70–99)
Glucose-Capillary: 116 mg/dL — ABNORMAL HIGH (ref 70–99)
Glucose-Capillary: 104 mg/dL — ABNORMAL HIGH (ref 70–99)
Glucose-Capillary: 112 mg/dL — ABNORMAL HIGH (ref 70–99)

## 2021-03-07 LAB — CULTURE, BLOOD (ROUTINE X 2)
Special Requests: ADEQUATE
Special Requests: ADEQUATE

## 2021-03-07 LAB — PROCALCITONIN: Procalcitonin: 31.36 ng/mL

## 2021-03-07 LAB — PROTIME-INR
INR: 1.9 — ABNORMAL HIGH (ref 0.8–1.2)
Prothrombin Time: 22.2 seconds — ABNORMAL HIGH (ref 11.4–15.2)

## 2021-03-07 LAB — MAGNESIUM: Magnesium: 2 mg/dL (ref 1.7–2.4)

## 2021-03-07 LAB — PHOSPHORUS: Phosphorus: 4.9 mg/dL — ABNORMAL HIGH (ref 2.5–4.6)

## 2021-03-07 LAB — CYTOLOGY - NON PAP

## 2021-03-07 MED ORDER — PANCRELIPASE (LIP-PROT-AMYL) 12000-38000 UNITS PO CPEP
24000.0000 [IU] | ORAL_CAPSULE | Freq: Three times a day (TID) | ORAL | Status: DC
  Filled 2021-03-07 (×2): qty 2

## 2021-03-07 MED ORDER — DIGOXIN 0.25 MG/ML IJ SOLN
0.2500 mg | Freq: Once | INTRAMUSCULAR | Status: AC
  Administered 2021-03-07: 0.25 mg via INTRAVENOUS
  Filled 2021-03-07: qty 2

## 2021-03-07 MED ORDER — GLYCOPYRROLATE 0.2 MG/ML IJ SOLN: 0.2000 mg | INTRAMUSCULAR | Status: DC | PRN

## 2021-03-07 MED ORDER — DULOXETINE HCL 30 MG PO CPEP
30.0000 mg | ORAL_CAPSULE | Freq: Every day | ORAL | Status: DC
  Filled 2021-03-07 (×2): qty 1

## 2021-03-07 MED ORDER — ONDANSETRON HCL 4 MG/2ML IJ SOLN
4.0000 mg | Freq: Four times a day (QID) | INTRAMUSCULAR | Status: DC | PRN
  Administered 2021-03-07: 4 mg via INTRAVENOUS
  Filled 2021-03-07: qty 2

## 2021-03-07 MED ORDER — MORPHINE SULFATE (PF) 2 MG/ML IV SOLN
2.0000 mg | INTRAVENOUS | Status: DC | PRN
Start: 2021-03-07 — End: 2021-03-07
  Administered 2021-03-07: 2 mg via INTRAVENOUS
  Filled 2021-03-07: qty 1

## 2021-03-07 MED ORDER — POTASSIUM CHLORIDE 10 MEQ/50ML IV SOLN
10.0000 meq | INTRAVENOUS | Status: DC
  Administered 2021-03-07 (×2): 10 meq via INTRAVENOUS
  Filled 2021-03-07 (×3): qty 50

## 2021-03-07 MED ORDER — TAMSULOSIN HCL 0.4 MG PO CAPS
0.4000 mg | ORAL_CAPSULE | Freq: Every day | ORAL | Status: DC
  Administered 2021-03-07: 0.4 mg via ORAL
  Filled 2021-03-07: qty 1

## 2021-03-07 MED ORDER — BIOTENE DRY MOUTH MT LIQD: 15.0000 mL | OROMUCOSAL | Status: DC | PRN

## 2021-03-07 MED ORDER — GLYCOPYRROLATE 1 MG PO TABS: 1.0000 mg | ORAL_TABLET | ORAL | Status: DC | PRN

## 2021-03-07 MED ORDER — HYDROMORPHONE HCL 1 MG/ML IJ SOLN
0.5000 mg | INTRAMUSCULAR | Status: DC | PRN
  Administered 2021-03-07 – 2021-03-08 (×3): 1 mg via INTRAVENOUS
  Filled 2021-03-07 (×3): qty 1

## 2021-03-07 MED ORDER — LORAZEPAM 2 MG/ML IJ SOLN: 1.0000 mg | INTRAMUSCULAR | Status: DC | PRN

## 2021-03-07 MED ORDER — LORAZEPAM 2 MG/ML PO CONC: 1.0000 mg | ORAL | Status: DC | PRN

## 2021-03-07 MED ORDER — POLYVINYL ALCOHOL 1.4 % OP SOLN
1.0000 [drp] | Freq: Four times a day (QID) | OPHTHALMIC | Status: DC | PRN
  Filled 2021-03-07: qty 15

## 2021-03-07 MED ORDER — AMIODARONE IV BOLUS ONLY 150 MG/100ML
150.0000 mg | Freq: Once | INTRAVENOUS | Status: AC
  Administered 2021-03-07: 150 mg via INTRAVENOUS
  Filled 2021-03-07: qty 100

## 2021-03-07 MED ORDER — OXYCODONE HCL 5 MG/5ML PO SOLN
5.0000 mg | ORAL | Status: DC | PRN
  Administered 2021-03-07 – 2021-03-08 (×2): 5 mg via ORAL
  Filled 2021-03-07 (×2): qty 5

## 2021-03-07 MED ORDER — LORAZEPAM 1 MG PO TABS: 1.0000 mg | ORAL_TABLET | ORAL | Status: DC | PRN

## 2021-03-07 NOTE — Progress Notes (Signed)
Daily Progress Note   Patient Name: Isaiah Patterson       Date: 03/07/2021 DOB: 1945/07/18  Age: 75 y.o. MRN#: [id number] Attending Physician: Laurin Coder, MD Primary Care Physician: [facility] Admit Date: 03/05/2021  Reason for Consultation/Follow-up: symptom management, end of life care  Subjective: I was notified by Marni Griffon NP that this morning patient and family had decided to forego percutaneous abscess drainage, and transition to comfort care.   On my assessment, patient is sleeping and appears comfortable. He recently received pain medication. His significant other [name] is at bedside. I confirmed with [name] that goal is to focus on comfort measures only and proceed with residential hospice. Reviewed that comfort care includes keeping him clean/dry/repositioned, no labs, no artificial hydration or feeding, no antibiotics, minimizing of medications, comfort feeds, and medication for pain and dyspnea.   Created space and opportunity for [name] to express thoughts and feelings regarding patient's current medical situation. Also provided education on trajectory at EOL. Emotional support provided   Length of Stay: 2  Current Medications: Scheduled Meds:  . Chlorhexidine Gluconate Cloth  6 each Topical Daily  . DULoxetine  30 mg Oral Daily    Continuous Infusions: . sodium chloride 10 mL/hr at 03/07/21 0900    PRN Meds: sodium chloride, antiseptic oral rinse, glycopyrrolate **OR** glycopyrrolate **OR** glycopyrrolate, HYDROmorphone (DILAUDID) injection, LORazepam **OR** LORazepam **OR** LORazepam, ondansetron (ZOFRAN) IV, oxyCODONE, polyvinyl alcohol, prochlorperazine  Physical Exam Vitals reviewed.  Constitutional:      General: He is sleeping. He is not  in acute distress.    Appearance: He is ill-appearing.  Pulmonary:     Effort: Pulmonary effort is normal.            Vital Signs: BP (!) 108/59   Pulse 83   Temp 98.4 F (36.9 C) (Oral)   Resp 14   Ht 5\' 6"  (1.676 m)   Wt 58.9 kg   SpO2 94%   BMI 20.96 kg/m  SpO2: SpO2: 94 % O2 Device: O2 Device: Nasal Cannula O2 Flow Rate: O2 Flow Rate (L/min): 2 L/min   LBM: Last BM Date: 03/05/21 Baseline Weight: Weight: 73 kg Most recent weight: Weight: 58.9 kg       Palliative Assessment/Data: PPS 20%       Palliative Care Assessment &  Plan   HPI/Patient Profile: 75 y.o. 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 CT was concerning for hepatic abscess. Patient was admitted on 03/05/2021 with hypotension, aspiration pneumonitis, pulmonary nodules, AKI, stage IV metastatic adenocarcinoma of pancreas, protein calorie malnutrition/FTT.    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%  Recommendations/Plan: Full comfort measures  Transfer to [facility] when bed becomes available  Added orders for symptom management at EOL  PMT will continue to follow and support  Symptom Management:  Dilaudid 0.5-1 mg IV every 2 hours prn for pain or dyspnea Lorazepam (ATIVAN) prn for anxiety Haloperidol (HALDOL) prn for agitation  Glycopyrrolate (ROBINUL) for excessive secretions Ondansetron (ZOFRAN) prn for nausea Polyvinyl alcohol (LIQUIFILM TEARS) prn for dry eyes Antiseptic oral rinse (BIOTENE) prn for dry  mouth    Goals of Care and Additional Recommendations: Limitations on Scope of Treatment: Full Comfort Care  Code Status: DNR/DNI   Prognosis:  < 2 weeks  Discharge Planning: Hospice facility   Thank you for allowing the Palliative Medicine Team to assist in the care of this patient.   Total Time 25 minutes Prolonged Time Billed  no       Greater than 50%  of this time was spent counseling and coordinating care related to the above assessment and plan.  Lavena Bullion, NP  Please contact Palliative Medicine Team phone at [phone] for questions and concerns.

## 2021-03-07 NOTE — Progress Notes (Signed)
Patient had consistent episodes of atrial fibrillation RVR  At 2300 with rates in the 150s and E-link  MD was notified at 2300. 1 Amio bolus was given as well as 2 doses of digoxin were adminstered through out the night. Patient stabilized around 4:45 AM. No further concerns.

## 2021-03-07 NOTE — Discharge Summary (Addendum)
Physician Discharge Summary         Patient ID: Isaiah Patterson MRN: [id number] DOB/AGE: 1946/03/06 75 y.o.  Admit date: 03/05/2021 Discharge date: 03/08/2021  Discharge Diagnoses:    Septic shock due to GNR bacteremia from hepatic abscess +/- aspiration PNA. 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 coli bacteremia secondary to hepatic abscess.  Hospital Course  11/6 admitted with hypotension, suspected septic shock, Started on Vanc/cefepime and flagyl. Cultures sent. IVFs administered. Started on Norepi. [place] 11/6 > mild cerebral atrophy and chronic small vessel disease, otherwise non acute. CTA PE 11/6 > neg for PE, small right and small to moderate left pleural effusions with compressive atelectasis, multiple new solid and sub-solid subpleural nodules in RUL and RML, T7 and T8 compression fractures, ascites and multiple hepatic masses (see CT a/p) CT abd/pelvis 11/6 > Conglomerate of multi-septated hypodense masses occupying nearly the entirety of left lobe liver, may be concerning for hepatic abscesses in the setting of sepsis.  Focal severe narrowing of main portal vein at the confluence of the splenic vein  and SMV.  Vague soft tissue at the focal area of narrowing as well as surrounding the common hepatic artery concerning for recurrent malignany.  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  Multiple small wedge-shaped infarcts in the superior and posterior aspects of spleen.  Large volume simple abdominopelvic ascites. Seen by cards. Seen by [facility] family deciding of goals of care. DNR established.  11/7 GNR in blood. Vanc stopped. Hypotensive overnight. Got fluid. Treated for cancer related pain. Transfuse 2 units FFP for volume and treating coagulopathy  11/8: Frequent episodes of atrial fibrillation with RVR requiring amiodarone boluses overnight.  Blood cultures growing E. coli.  INR remaining 1.9 in spite of FFP.  Stemler discussion with patient and his daughter Isaiah Patterson at bedside.  Ultimately patient decided to proceed with comfort care measures and forego percutaneous drainage of abscess.  Requesting transfer to residential hospice. Discharge Plan by Active Problems    advanced widespread metastatic adenocarcinoma of the pancreas, now complicated by severe sepsis with E. coli bacteremia secondary to hepatic abscess Comfort Care Plan Liberalize analgesia Diet as tolerated Full DNR Comfort care focus  Family support     Discharge Exam: BP (!) 145/83 (BP Location: Right Leg)   Pulse 87   Temp 98.1 F (36.7 C) (Oral)   Resp 19   Ht 5\' 6"  (1.676 m)   Wt 58.9 kg   SpO2 96%   BMI 20.96 kg/m   General: This is a debilitated 75 year old white male he is lying in bed, he is having more pain today than compared with yesterday particularly over the abdomen HEENT mucous membranes dry and cracked, temporal wasting, sclera nonicteric.  No JVD. Pulmonary: Diminished bases no accessory use currently 4 L/min Cardiac: Regular  rate and rhythm Abdomen: Distended tender to palpation dressing over right lower quadrant from previous paracentesis GU: Clear yellow Neuro: Awake oriented no  focal deficits.  Labs at discharge   Lab Results  Component Value Date   CREATININE 1.53 (H) 03/07/2021   BUN 41 (H) 03/07/2021   NA 125 (L) 03/07/2021   K 3.6 03/07/2021   CL 95 (L) 03/07/2021   CO2 20 (L) 03/07/2021   Lab Results  Component Value Date   WBC 11.4 (H) 03/07/2021   HGB 9.6 (L) 03/07/2021   HCT 26.4 (L) 03/07/2021   MCV 86.0 03/07/2021   PLT 62 (L) 03/07/2021   Lab Results  Component Value Date   ALT 22 03/07/2021   AST 22 03/07/2021   ALKPHOS 230 (H) 03/07/2021   BILITOT 2.4 (H) 03/07/2021   Lab Results  Component Value Date   INR 1.9 (H) 03/07/2021   INR 1.9 (H) 03/06/2021   INR 2.4 (H) 03/05/2021    Current radiological studies    No results found.  Disposition:    Discharge disposition: 51-Hospice/Medical Facility        Allergies as of 03/08/2021       Reactions   Shellfish Allergy Hives   Trazodone And Nefazodone Other (See Comments)   Unknown        Medication List     STOP taking these medications    amiodarone 400 MG tablet Commonly known as: PACERONE   apixaban 5 MG Tabs tablet Commonly known as: ELIQUIS   aspirin EC 81 MG tablet   atorvastatin 40 MG tablet Commonly known as: LIPITOR   atorvastatin 80 MG tablet Commonly known as: LIPITOR   clopidogrel 75 MG tablet Commonly known as: PLAVIX   DULoxetine 30 MG capsule Commonly known as: CYMBALTA   empagliflozin 10 MG Tabs tablet Commonly known as: JARDIANCE   ferrous sulfate 325 (65 FE) MG tablet   furosemide 20 MG tablet Commonly known as: LASIX   isosorbide dinitrate 30 MG tablet Commonly known as: ISORDIL   Levothyroxine Sodium 37.5 MCG/ML Soln   losartan 25 MG tablet Commonly known as: COZAAR   Magnesium Oxide 400 MG Caps   metoprolol succinate 25 MG 24 hr tablet Commonly known as: TOPROL-XL   metoprolol succinate 50 MG 24 hr tablet Commonly known as: TOPROL-XL   nitroGLYCERIN 0.4 MG SL tablet Commonly known as: Nitrostat    ondansetron 8 MG tablet Commonly known as: ZOFRAN Replaced by: ondansetron 4 MG/2ML Soln injection   Pancrelipase (Lip-Prot-Amyl) 24000-76000 units Cpep   pantoprazole 40 MG tablet Commonly known as: PROTONIX   potassium chloride 10 MEQ tablet Commonly known as: KLOR-CON   spironolactone 25 MG tablet Commonly known as: ALDACTONE   tamsulosin 0.4 MG Caps capsule Commonly known as: FLOMAX       TAKE these medications    antiseptic oral rinse Liqd Apply 15 mLs topically as needed for dry mouth.   glycopyrrolate 1 MG tablet Commonly known as: ROBINUL Take 1 tablet (1 mg total) by mouth every 4 (four) hours as needed (excessive secretions).   HYDROmorphone 1 MG/ML injection Commonly known as: DILAUDID Inject 0.5-1 mLs (0.5-1 mg total) into the vein every 2 (two) hours as needed for severe pain or moderate pain (or dyspnea).   LORazepam 1 MG tablet Commonly known as: ATIVAN Take 1 tablet (1 mg total) by mouth every 4 (four) hours as needed for anxiety.   ondansetron 4 MG/2ML Soln injection Commonly known as: ZOFRAN Inject 2 mLs (  4 mg total) into the vein every 6 (six) hours as needed for nausea or vomiting. Replaces: ondansetron 8 MG tablet   oxyCODONE 5 MG/5ML solution Commonly known as: ROXICODONE Take 5 mLs (5 mg total) by mouth every 3 (three) hours as needed for moderate pain.   polyvinyl alcohol 1.4 % ophthalmic solution Commonly known as: LIQUIFILM TEARS Place 1 drop into both eyes 4 (four) times daily as needed for dry eyes.   prochlorperazine 10 MG/2ML injection Commonly known as: COMPAZINE Inject 2 mLs (10 mg total) into the vein every 6 (six) hours as needed.         Follow-up appointment   none Discharge Condition:    poor  Physician Statement:   The Patient was personally examined, the discharge assessment and plan has been personally reviewed and I agree with ACNP Babcock's assessment and plan. 30 minutes of time have been dedicated to  discharge assessment, planning and discharge instructions.   Signed: Omar Person 03/08/2021, 10:44 AM

## 2021-03-07 NOTE — Progress Notes (Signed)
[facility] Progress Note Patient Name: VINSON TIETZE DOB: 12-28-1945 MRN: [id number]   Date of Service  03/07/2021  HPI/Events of Note  Patient with persistent RVR, rate in the 140's, QTC 514, will order one dose of Amiodarone 150 mg.  eICU Interventions  See above. Will also give 20 meq of KCL iv over 2 hours to bring the K+ closer to 4 meq. Mg+ 2.0.        Kerry Kass Earl Zellmer 03/07/2021, 3:21 AM

## 2021-03-07 NOTE — Progress Notes (Addendum)
[facility] Progress Note Patient Name: Isaiah Patterson DOB: [birth year] MRN: [id number]   Date of Service  03/07/2021  HPI/Events of Note  Patient with atrial fibrillation with RVR, it persists despite weaning pressors and giving a dose of Digoxin. BP now lower but MAP is 73.  eICU Interventions  Second dose of Digoxin 0.25 mg iv ordered. BMP, Mag results still pending.        Stassi Fadely U Shalay Carder 03/07/2021, 12:12 AM

## 2021-03-07 NOTE — Progress Notes (Signed)
   Noted plans for hospice.  Comfort measures so no further management of atrial fib.  We will sign off.

## 2021-03-07 NOTE — Progress Notes (Signed)
[facility] Progress Note Patient Name: Isaiah Patterson DOB: 14-Jan-1946 MRN: [id number]   Date of Service  03/07/2021  HPI/Events of Note  Patient with persistent atrial fibrillation with RVR despite  2 doses of Digoxin, heart rate is 135-145, BP 80/67, MAP 73.  eICU Interventions  Will order a 12 lead EKG to check if the QTC has normalized, permitting the ordering of an Amiodarone bolus, otherwise he may require cardioversion in the AM (or sooner if his blood pressure becomes unstable). BMP and Mg+ just now being drawn.        Dionta Larke U Raynald Rouillard 03/07/2021, 1:16 AM

## 2021-03-07 NOTE — Progress Notes (Addendum)
[facility]) Hospital Liaison Note  Received request from [place] for family interest in [facility] with request to transfer. Chart reviewed and eligibility confirmed. Met with patient and family [name](fiance) and Teresa(daughter) to confirm interest and explain services. Family agreeable to transfer when bed is available. [facility] will continue to follow and update.  Bed offered and accepted with plans to transfer tomorrow 03/08/2021.   TOC can set up transport once confirmation is received that consents are complete.   Thank you. Please call with any questions.    Zollie Pee [facility] Liaison  [phone]

## 2021-03-07 NOTE — Progress Notes (Signed)
NAME:  Isaiah Patterson, MRN:  [id number], DOB:  November 05, 1945, LOS: 2 ADMISSION DATE:  03/05/2021, CONSULTATION DATE:  03/05/2021 REFERRING MD:  Dr. Ronnald Nian, CHIEF COMPLAINT:  weakness   History of Present Illness:  75 year old male with PMH as below, significant for but not limited to metastatic adenocarcinoma of pancreas stage IV and combined HF.    He is currently being treated at the [facility] for his metastatic pancreatic cancer.  Currently on palliative chemotherapy with gemcitabine and paclitaxel, however has not had chemo in the last month due to weakness and low blood counts.    Most recently, he was admitted at [facility] in October with afib with RVR, decompensated HF with EF <20%, and anemia found to have new RLE DVT, changed from DAPT to Eliquis.   Most of information provided by patient's fiance, Isaiah Patterson at bedside as he sleeps.  They have been together for 10 years and they live in [place].  He has two daughters, one lives locally.  Isaiah Patterson states that since hospital discharge, he has not been the same.   He sleeps most of the time, poor appetite with poor PO intake.  Generalized weakness, which has progressed to him sliding off the couch on Thursday and again this morning to where she could not get him up having to call EMS.  He has become so weak, he often coughs and chokes on food or drink.  He has not complained of any pain.  Noticed some confusion today.  Has had a cough, sometimes productive, and had an episode of chills last night with two possible low grade temps.  His blood pressure has been running lower, in which his hypertensive medications were recently decreased.  Denies any vomiting, diarrhea, or SOB.    In ER, afebrile 98.8, BP 87/56, NSR 84, rr 20, and 93% on room air.  Labs noted for WBC 14.8, Hgb 9.8, INR 2.4, BNP 1348, trop hs 1161, normal lactate 1.4, ammonia 16, Na 126, CL 93, BUN 26, sCr 1.39, albumin 1.7, AST 57, alk phos 429, t. Bili 3.4, protein 4.7, SARS/ flu negative.  ABG  7.569/ 27.9/ 113/ 25.5.   CXR with low lung volumes, patchy opacities, L>R, possible small effusions with atelectasis.  CTA chest, CT head, and CT abd/ pelvis imaging as below-  possible concerning for hepatic abscess in the setting of sepsis vs advanced malignancy.  EKG with NSR, prolonged Qtc (560), and no acute changes.  Empirically started on cefepime, flagyl, and vancomycin.  Given 2 LR bolus with some improvement in blood pressure but since has been started on low dose levophed.  Cardiology consulted given positive troponin and elevated BNP.  Pending surgery, IR, and palliative care consult by EDP.  PCCM asked to admit.   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 Cultures sent. IVFs administered. Started on Norepi. [place] 11/6 > mild cerebral atrophy and chronic small vessel disease, otherwise non acute.  CTA PE 11/6 > neg for PE, small right and small to moderate left pleural effusions with compressive atelectasis, multiple new solid and sub-solid subpleural nodules in RUL and RML, T7 and T8 compression fractures, ascites and multiple hepatic masses (see CT a/p) CT abd/pelvis 11/6 > Conglomerate of multi-septated hypodense masses occupying nearly the entirety of left lobe liver, may be concerning for hepatic abscesses in the setting of sepsis.  Focal severe narrowing of main portal vein at the confluence of the  splenic vein and SMV.  Vague soft tissue at the focal area of narrowing as well as surrounding the common hepatic artery concerning for recurrent malignany.  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  Multiple small wedge-shaped infarcts in the superior and posterior aspects of spleen.  Large volume simple abdominopelvic ascites. Seen by cards. Seen by [facility] family deciding of goals of care. DNR established.  11/7 GNR in blood. Vanc stopped. Hypotensive overnight. Got fluid. Treated for cancer related pain. Transfuse 2 units FFP for volume and treating coagulopathy  11/8: Frequent episodes of atrial fibrillation with RVR requiring amiodarone boluses overnight.  Blood cultures growing E. coli.  INR remaining 1.9 in spite of FFP.  [place] discussion with patient and his daughter Isaiah Patterson at bedside.  Ultimately patient decided to proceed with comfort care measures and forego percutaneous drainage of abscess.  Requesting transfer to residential hospice.  Interim History / Subjective:  Having pain in abdomen Objective   Blood pressure (Abnormal) 159/90, pulse 81, temperature 97.8 F (36.6 C), temperature source Oral, resp. rate (Abnormal) 22, height '5\' 6"'  (1.676 m), weight 58.9 kg, SpO2 100 %.        Intake/Output Summary (Last 24 hours) at 03/07/2021 0803 Last data filed at 03/07/2021 0500 Gross per 24 hour  Intake 1533.81 ml  Output 650 ml  Net 883.81 ml   Filed Weights   03/05/21 1545 03/06/21 0500 03/07/21 0450  Weight: 66.8 kg 64.1 kg 58.9 kg   Examination:  General: This is a debilitated 75 year old white male he is lying in bed, he is having more pain today than compared with yesterday particularly over the abdomen HEENT mucous membranes dry and cracked, temporal wasting, sclera nonicteric.  No JVD. Pulmonary: Diminished bases no accessory use currently 4 L/min Cardiac: Regular rate and rhythm Abdomen: Distended tender to palpation dressing over right lower quadrant from  previous paracentesis GU: Clear yellow Neuro: Awake oriented no focal deficits.  Resolved Hospital Problem list    Assessment & Plan:   Septic shock due to GNR bacteremia from hepatic abscess +/- aspiration PNA. Suspect c/b hypovolemia  Suspected aspiration pneumonitis Pulmonary nodules Bilateral pulmonary effusions,R>L  Stage IV Metastatic Adenocarcinoma of Pancreas w/  Severe protein calorie malnutrition and Failure to thrive 2/2 advanced cancer Cancer related pain AKI  Coagulopathy 2/2 sepsis/hepatic abscess + liver mets; also was on DOAC Hx combined HF, ICM, CAD s/p remote CABG, HTN, HLD Positive troponin  Elevated BNP Prolonged QTc PAF, with rapid ventricular response Hyponatremia None anion gap metabolic acidosis RLE DVT (01/2021)-> off AC Normocytic anemia: no evidence of bleeding  DO NOT RESUSCITATE status Comfort care status effective 11/8  Discussion 75 year old male with advanced widespread metastatic adenocarcinoma of the pancreas, now admitted with severe sepsis with E. coli bacteremia secondary to hepatic abscess.  Suspect infected liver mets, further complicated by aspiration pneumonia.  He has been on appropriate antimicrobial coverage but clinically has deteriorated over the course of his hospitalization as evidenced by worsening and more  frequent episodes of atrial fibrillation with RVR, Ongoing renal insufficiency, slight increase in oxygen need, and persistent coagulopathy.  We had a [place] discussion in regards to goals of care once again this morning.  His only chance at survival from infection would be to proceed with percutaneous drainage of abscess, however were now more than 48 hours into the infection without adequate source control, and given his underlying comorbidities there is no guarantee at this point this would be successful.  Yvone Neu and his daughter were present for this discussion.  Yvone Neu is indicated he would prefer at this point to forego intervention, and  proceed with comfort care.  I discussed with him that at this point we would focus solely on comfort.  We would liberalize his diet and let him eat what ever he wants, discontinue antibiotics, and initiate symptom/comfort care.  He indicated to me that this is indeed consistent with his values, and he and his daughter both would like to go forward with residential hospice.  Plan Discontinue antibiotics Discontinue IR consultation KVO IV fluids Liberalize analgesia Diet as tolerated DC telemetry Will contact palliative/hospice care to help with arranging residential  hospice transfer Best Practice (right click and "Reselect all SmartList Selections" daily)   Diet/type: Regular consistency (see orders) DVT prophylaxis: SCD GI prophylaxis: N/A Lines: N/A, pta right chest port > accessed 11/6 > Foley:  N/A Code Status:  Full DNR Last date of multidisciplinary goals of care discussion: Full comfort care    Critical care time: 35 min  Erick Colace ACNP-BC [place] Pager # [phone] OR # [phone] if no answer

## 2021-03-07 NOTE — Progress Notes (Signed)
Pt transitioned to comfort care with plans to transfer to a residential hospice facility.  Pt resting comfortably with family at bedside.

## 2021-03-08 DIAGNOSIS — Z515 Encounter for palliative care: Secondary | ICD-10-CM | POA: Diagnosis not present

## 2021-03-08 DIAGNOSIS — I959 Hypotension, unspecified: Secondary | ICD-10-CM

## 2021-03-08 DIAGNOSIS — R7881 Bacteremia: Secondary | ICD-10-CM | POA: Diagnosis not present

## 2021-03-08 LAB — TRIGLYCERIDES, BODY FLUIDS: Triglycerides, Fluid: 26 mg/dL

## 2021-03-08 LAB — GLUCOSE, CAPILLARY: Glucose-Capillary: 112 mg/dL — ABNORMAL HIGH (ref 70–99)

## 2021-03-08 NOTE — Progress Notes (Signed)
Report given to [facility] at [facility]. Transport came to pick patient up. Family at bedside. Belongings taken with family. No distress noted at this time.

## 2021-03-08 NOTE — Progress Notes (Signed)
[facility]) Hospital Liaison Note  Consents complete. Bed available and family aware. TOC can arrange transport. Unit RN please call report to [phone] prior to patient leaving the unit. Please send signed DNR and paperwork with patient. Thank you.   Please call with any questions or concerns.   Zollie Pee [facility] Liaison  [phone]

## 2021-03-08 NOTE — Progress Notes (Signed)
Patient vomited after oral solution oxycodone was given. PRN IV medication was given to help with pain. Pharmacy and MD notified.

## 2021-03-08 NOTE — Progress Notes (Signed)
NAME:  Isaiah Patterson, MRN:  [id number], DOB:  05/29/1945, LOS: 3 ADMISSION DATE:  03/05/2021, CONSULTATION DATE:  03/05/2021 REFERRING MD:  Dr. Ronnald Nian, CHIEF COMPLAINT:  weakness   History of Present Illness:  75 year old male with PMH as below, significant for but not limited to metastatic adenocarcinoma of pancreas stage IV and combined HF.    He is currently being treated at the [facility] for his metastatic pancreatic cancer.  Currently on palliative chemotherapy with gemcitabine and paclitaxel, however has not had chemo in the last month due to weakness and low blood counts.    Most recently, he was admitted at [facility] in October with afib with RVR, decompensated HF with EF <20%, and anemia found to have new RLE DVT, changed from DAPT to Eliquis.   Most of information provided by patient's fiance, Isaiah Patterson at bedside as he sleeps.  They have been together for 10 years and they live in [place].  He has two daughters, one lives locally.  Isaiah Patterson states that since hospital discharge, he has not been the same.   He sleeps most of the time, poor appetite with poor PO intake.  Generalized weakness, which has progressed to him sliding off the couch on Thursday and again this morning to where she could not get him up having to call EMS.  He has become so weak, he often coughs and chokes on food or drink.  He has not complained of any pain.  Noticed some confusion today.  Has had a cough, sometimes productive, and had an episode of chills last night with two possible low grade temps.  His blood pressure has been running lower, in which his hypertensive medications were recently decreased.  Denies any vomiting, diarrhea, or SOB.    In ER, afebrile 98.8, BP 87/56, NSR 84, rr 20, and 93% on room air.  Labs noted for WBC 14.8, Hgb 9.8, INR 2.4, BNP 1348, trop hs 1161, normal lactate 1.4, ammonia 16, Na 126, CL 93, BUN 26, sCr 1.39, albumin 1.7, AST 57, alk phos 429, t. Bili 3.4, protein 4.7, SARS/ flu negative.  ABG  7.569/ 27.9/ 113/ 25.5.   CXR with low lung volumes, patchy opacities, L>R, possible small effusions with atelectasis.  CTA chest, CT head, and CT abd/ pelvis imaging as below-  possible concerning for hepatic abscess in the setting of sepsis vs advanced malignancy.  EKG with NSR, prolonged Qtc (560), and no acute changes.  Empirically started on cefepime, flagyl, and vancomycin.  Given 2 LR bolus with some improvement in blood pressure but since has been started on low dose levophed.  Cardiology consulted given positive troponin and elevated BNP.  Pending surgery, IR, and palliative care consult by EDP.  PCCM asked to admit.  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 Cultures sent. IVFs administered. Started on Norepi. [place] 11/6 > mild  cerebral atrophy and chronic small vessel disease, otherwise non acute. CTA PE 11/6 > neg for PE, small right and small to moderate left pleural effusions with compressive atelectasis, multiple new solid and sub-solid subpleural nodules in RUL and RML, T7 and T8 compression fractures, ascites and multiple hepatic masses (see CT a/p) CT abd/pelvis 11/6 > Conglomerate of multi-septated hypodense masses occupying nearly the entirety of left lobe liver, may be concerning for hepatic abscesses in the setting of  sepsis.  Focal severe narrowing of main portal vein at the confluence of the splenic vein and SMV.  Vague soft tissue at the focal area of narrowing as well as surrounding the common hepatic artery concerning for recurrent malignany.  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  Multiple small wedge-shaped infarcts in the superior and posterior aspects of spleen.  Large volume simple abdominopelvic ascites. Seen by cards. Seen by [facility] family deciding of goals of care. DNR established.  11/7 GNR in blood. Vanc stopped. Hypotensive overnight. Got fluid. Treated for cancer related pain. Transfuse 2 units FFP for volume and treating coagulopathy  11/8: Frequent episodes of atrial fibrillation with RVR requiring amiodarone boluses overnight.  Blood cultures growing E. coli.  INR remaining 1.9 in spite of FFP.  [place] discussion with patient and his daughter Isaiah Patterson at bedside.  Ultimately patient decided to proceed with comfort care measures and forego percutaneous drainage of abscess.  Requesting transfer to residential hospice. Slight discomfort  Interim History / Subjective:  Having pain in abdomen Appears a little uncomfortable according to family at bedside Voices no specific complaint Objective   Blood pressure (!) 148/70, pulse 82, temperature 98.1 F (36.7 C), temperature source Oral, resp. rate 19, height '5\' 6"'  (1.676 m), weight 58.9 kg, SpO2 94 %.        Intake/Output Summary (Last 24 hours) at 03/08/2021 3154 Last data filed at 03/07/2021 0900 Gross per 24 hour  Intake 30.19 ml  Output --  Net 30.19 ml   Filed Weights   03/05/21 1545 03/06/21 0500 03/07/21 0450  Weight: 66.8 kg 64.1 kg 58.9 kg   Examination:  General: Debilitated, alert HEENT: Dry mucous membranes Pulmonary: Diminished breath sounds Cardiac: Regular rate and rhythm Abdomen: Distended  GU: Clear yellow Neuro: Awake oriented no focal deficits.  Resolved Hospital Problem list    Assessment & Plan:    Septic shock due to gram-negative bacteremia Hepatic abscess  Aspiration pneumonia Pulmonary nodules Bilateral pleural effusions Cancer-related abdominal pain Paroxysmal atrial fibrillation Prolonged QTC syndrome Hyponatremia Right lower extremity DVT Normocytic anemia  DO NOT RESUSCITATE status  Comfort care status 11/8  For transfer to residential hospice-[facility]  Discussion by Marni Griffon 75 year old male with advanced widespread metastatic adenocarcinoma of the pancreas, now admitted with severe sepsis with E. coli bacteremia secondary to hepatic abscess.  Suspect infected liver mets, further complicated by aspiration pneumonia.  He has been on appropriate antimicrobial coverage but clinically has deteriorated over the course of his hospitalization as evidenced by worsening and more frequent episodes of atrial fibrillation with RVR, Ongoing renal insufficiency, slight increase in oxygen need, and persistent coagulopathy.  We had a [place] discussion in regards to goals of care once again this morning.  His only chance at survival from infection would be to proceed with percutaneous drainage of abscess, however were now more than 48 hours into the infection without adequate source control, and given his underlying comorbidities there is no guarantee at this point this would be successful.  Yvone Neu and his daughter were present for this discussion.  Yvone Neu is indicated he would prefer at this point to forego intervention, and proceed with comfort care.  I discussed with him that at this point we would focus solely on comfort.  We would liberalize his diet and let him eat what ever he wants, discontinue antibiotics, and initiate symptom/comfort care.  He indicated to me that this is indeed consistent with his values, and he and his daughter both would like to go forward with residential hospice.  Plan  Off antibiotics Liberalize analgesia DC telemetry  For transfer to residential hospice  to be facilitated today  Discussed with daughter and fianc at bedside  Best Practice (right click and "Reselect all SmartList Selections" daily)   Diet/type: Regular consistency (see orders) DVT prophylaxis: SCD GI prophylaxis: N/A Lines: N/A, pta right chest port > accessed 11/6 > Foley:  N/A Code Status:  Full DNR Last date of multidisciplinary goals of care discussion: Full comfort care  Sherrilyn Rist, MD [place] PCCM Pager: See Shea Evans

## 2021-03-08 NOTE — TOC Transition Note (Addendum)
Transition of Care [facility]) - CM/SW Discharge Note   Patient Details  Name: Isaiah Patterson MRN: [id number] Date of Birth: 05-20-1945  Transition of Care [facility]) CM/SW Contact:  Sable Feil, LCSW Phone Number: 03/08/2021, 12:02 PM   Clinical Narrative:  Patient discharging to [facility] facility today, transported by non-emergency ambulance transport. Family members at bedside and were informed that transport arranged. Hospice representative informed that d/c summary completed and would be sent in discharge packet.     Final next level of care: [place] ([place]) Barriers to Discharge: Barriers Resolved   Patient Goals and CMS Choice Patient states their goals for this hospitalization and ongoing recovery are:: Family decided on Hospice placement (Family agreeable to Hospice placement) CMS Medicare.gov Compare Post Acute Care list provided to:: Other (Comment Required) (n/a) Choice offered to / list presented to : NA  Discharge Placement              Patient chooses bed at: Other - please specify in the comment section below: ([place]) Patient to be transferred to facility by: Non-emergency ambulance transport Name of family member notified: CSW talked with family members at bedside Patient and family notified of of transfer: 03/08/21  Discharge Plan and [place]                                   Social Determinants of Health ([place]) Interventions  Patient discharging to [place] hospice facility   Readmission Risk Interventions No flowsheet data found.

## 2021-03-10 LAB — CHOLESTEROL, BODY FLUID: Cholesterol, Fluid: 32 mg/dL

## 2021-03-11 LAB — CULTURE, BODY FLUID W GRAM STAIN -BOTTLE: Culture: NO GROWTH

## 2021-03-30 DEATH — deceased

## 2022-08-21 IMAGING — CT CT HEAD W/O CM
3 series · 16 of 47 positions shown, 19 images · non-contrast
Comparison: 12/26/2019

CLINICAL DATA: [place].

EXAM:
CT HEAD WITHOUT CONTRAST
TECHNIQUE: Contiguous axial images were obtained from the base of the skull
through the vertex without intravenous contrast.

[Series 3: head 5.0 h30s · axial · 0.43mm/px · z∈[-59,+66]mm · 10 of 30 slices shown, 13 images]
[im 3/30  brain]
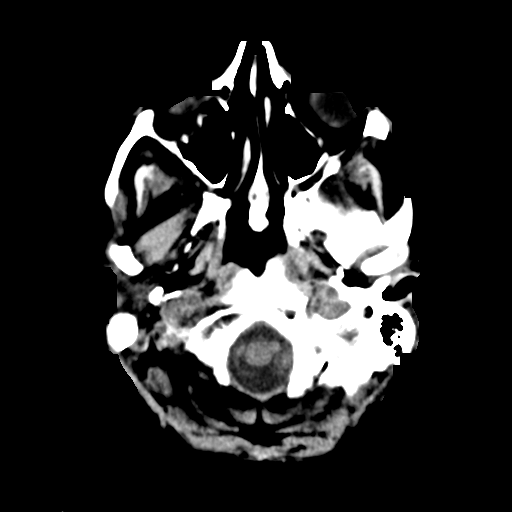
[im 3/30  bone]
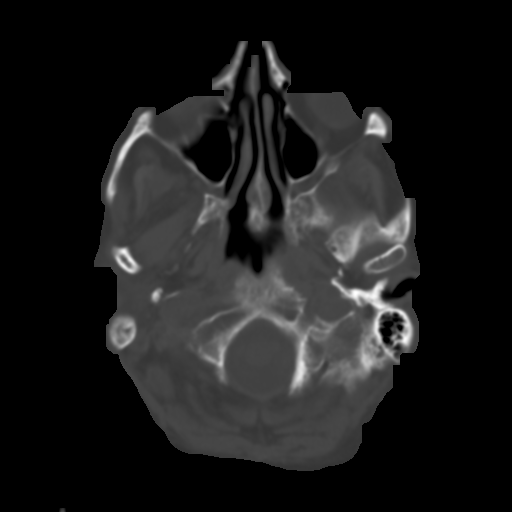
[im 6/30  brain]
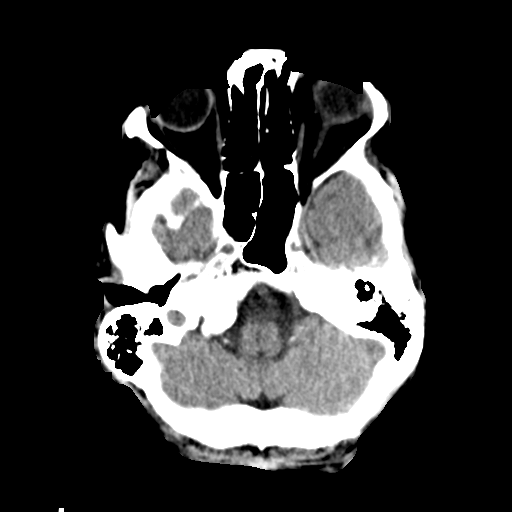
[im 9/30  brain]
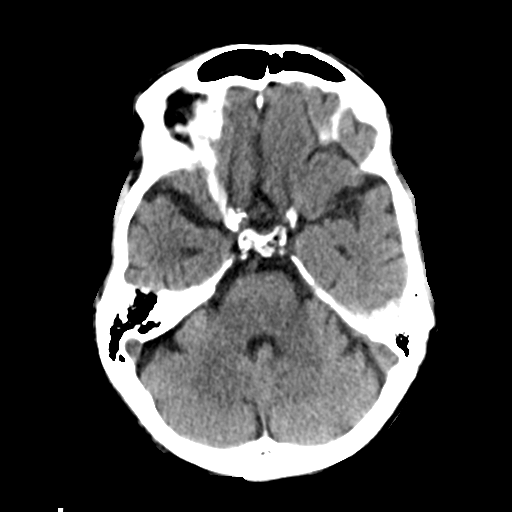
[im 11/30  brain]
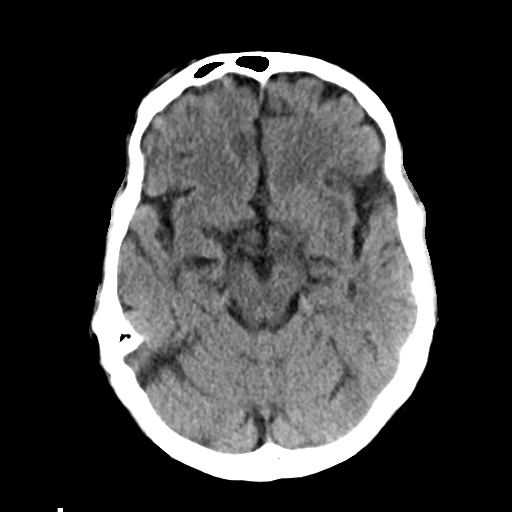
[im 14/30  brain]
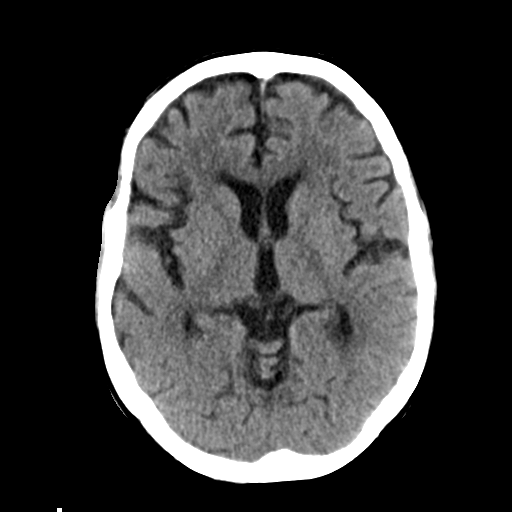
[im 14/30  bone]
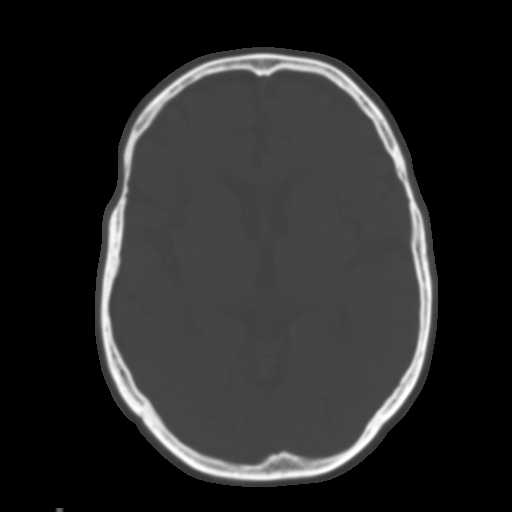
[im 17/30  brain]
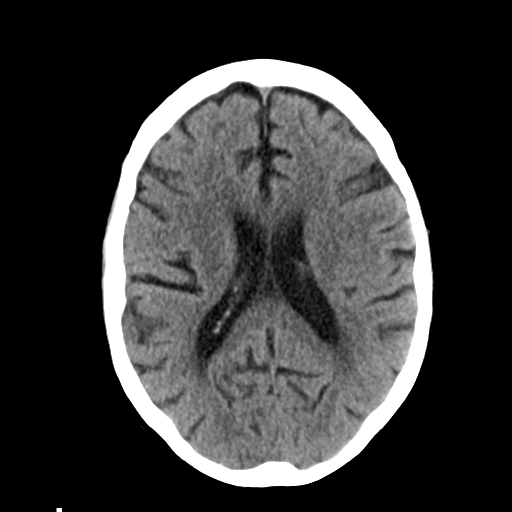
[im 20/30  brain]
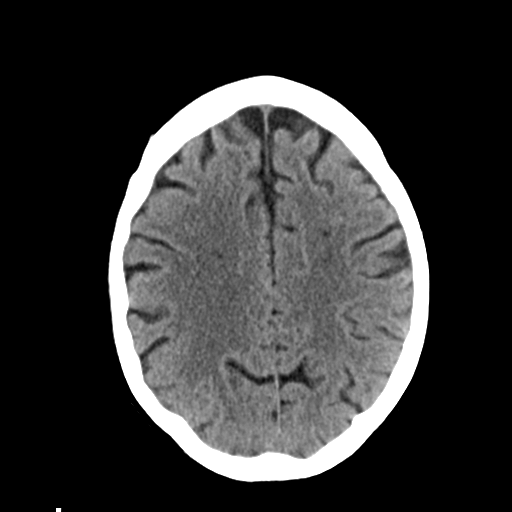
[im 23/30  brain]
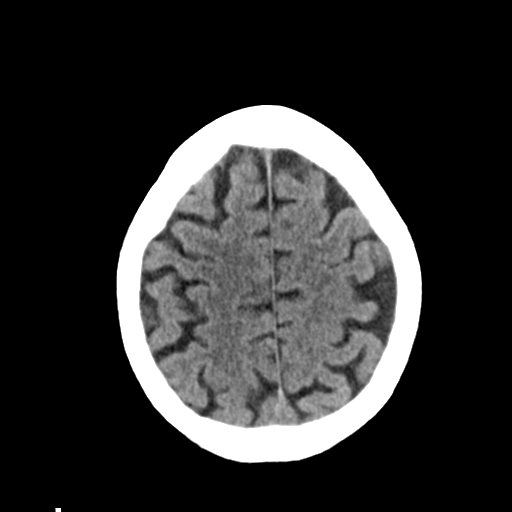
[im 25/30  brain]
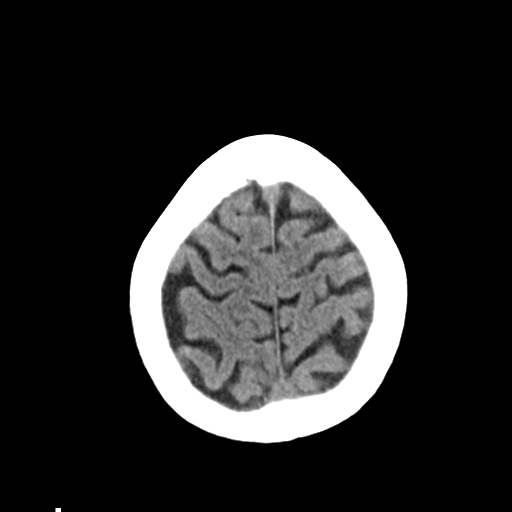
[im 25/30  bone]
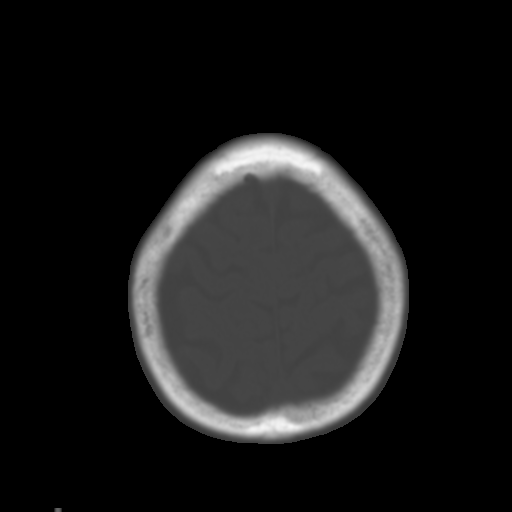
[im 28/30  brain]
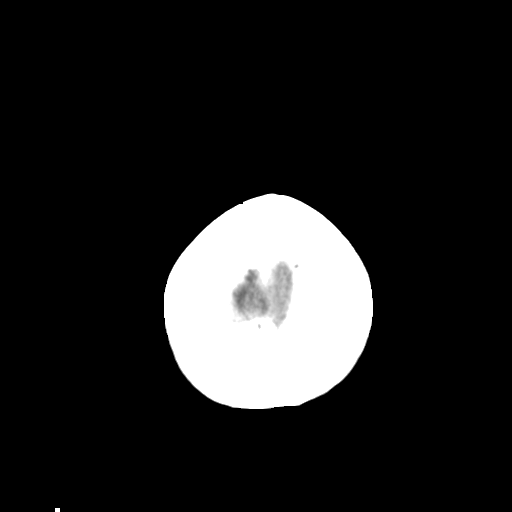

[Series 5: head 3.0 mpr cor · coronal · 0.29mm/px · 3 of 67 slices shown]
[im 23/67  brain]
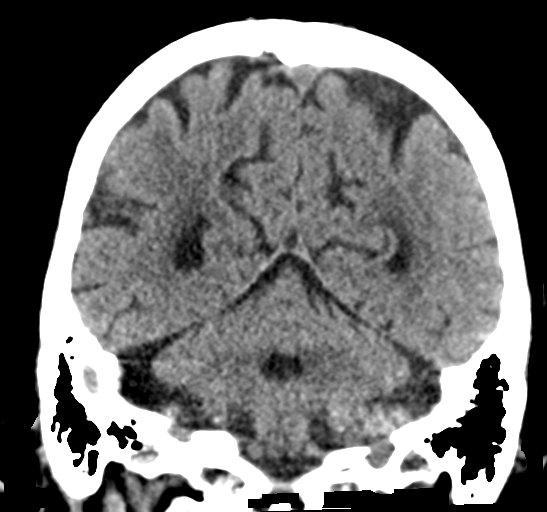
[im 30/67  brain]
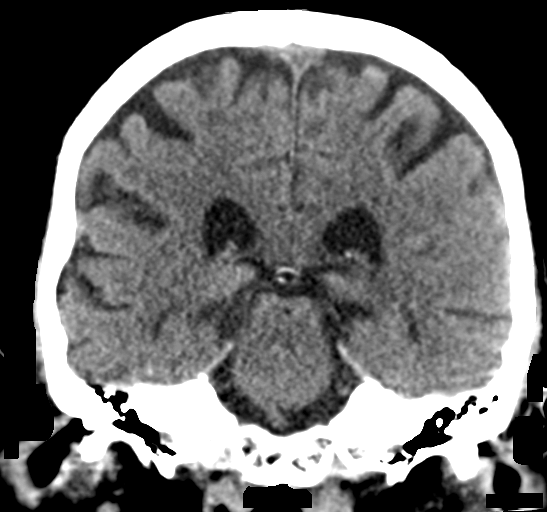
[im 37/67  brain]
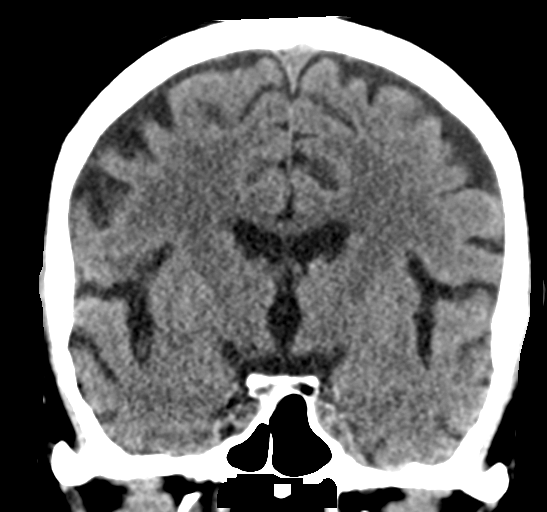

[Series 6: head 3.0 mpr sag · sagittal · 0.31mm/px · 3 of 51 slices shown]
[im 17/51  brain]
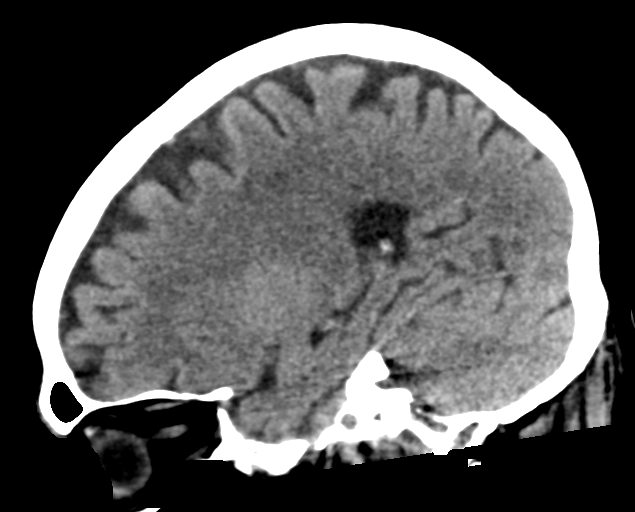
[im 26/51  brain]
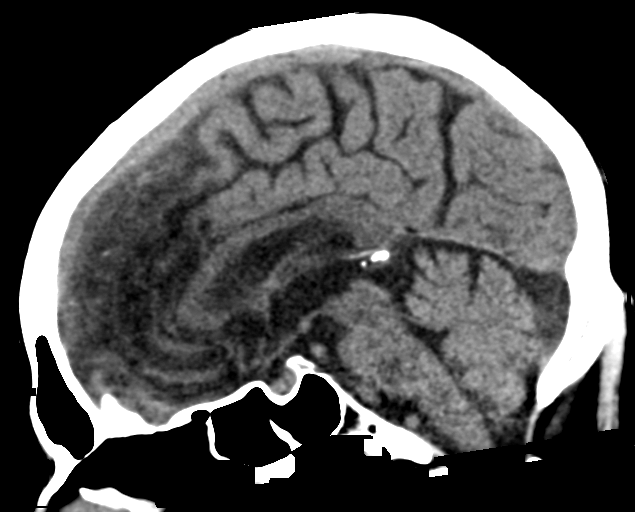
[im 34/51  brain]
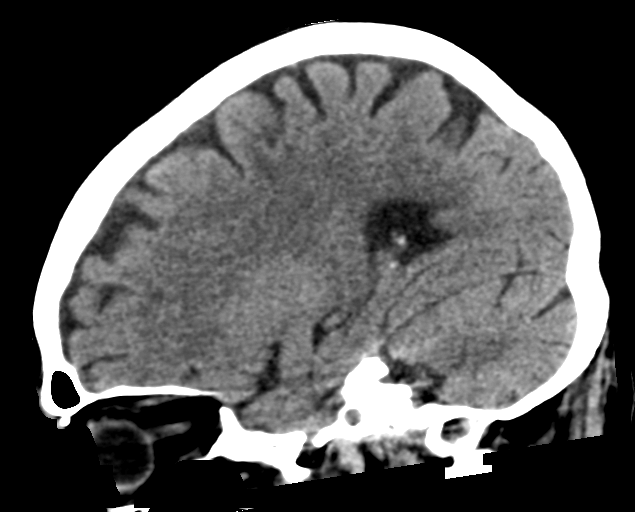

[16 of 47 positions shown; findings below may reference images not displayed]

FINDINGS: Brain: No evidence of acute infarction, hemorrhage, hydrocephalus,
extra-axial collection, or mass lesion/mass effect. Mild generalized
cerebral atrophy and chronic small vessel disease show no
significant change.

Vascular:  No hyperdense vessel or other acute findings.

Skull: No evidence of fracture or other significant bone
abnormality.

Sinuses/Orbits:  No acute findings.

Other: None.
IMPRESSION: No acute intracranial abnormality.

Stable mild cerebral atrophy and chronic small vessel disease.

## 2022-08-22 IMAGING — US IR PARACENTESIS
1 series · 4 of 4 positions shown · non-contrast
Comparison: none

INDICATION: History of pancreatic cancer, ascites seen on previous CT scan.
Request for therapeutic and diagnostic paracentesis.

[Series 1: ir (id) (id)/(id)/(id) ir · 4 of 4 slices shown]
[im 1/4]
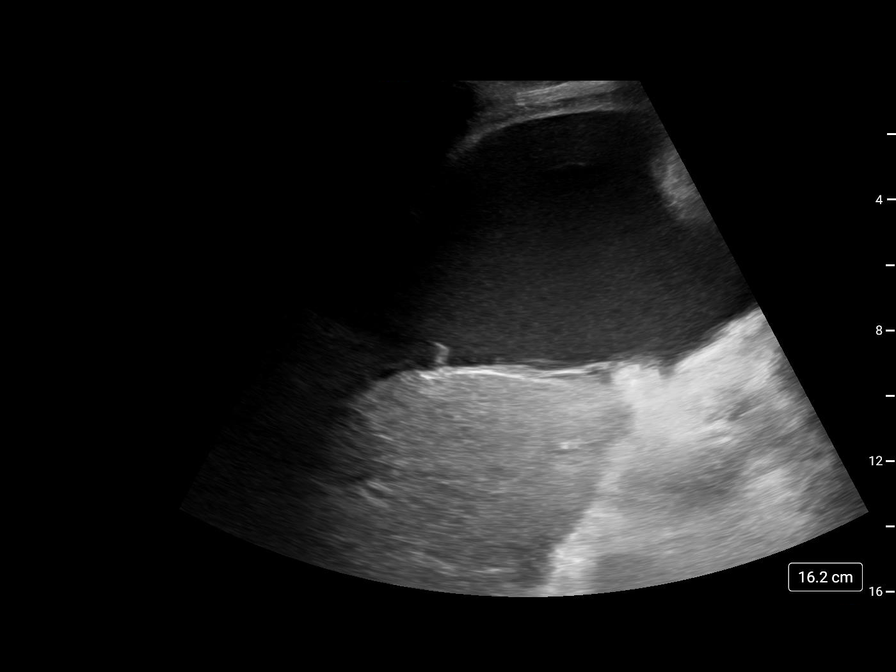
[im 2/4]
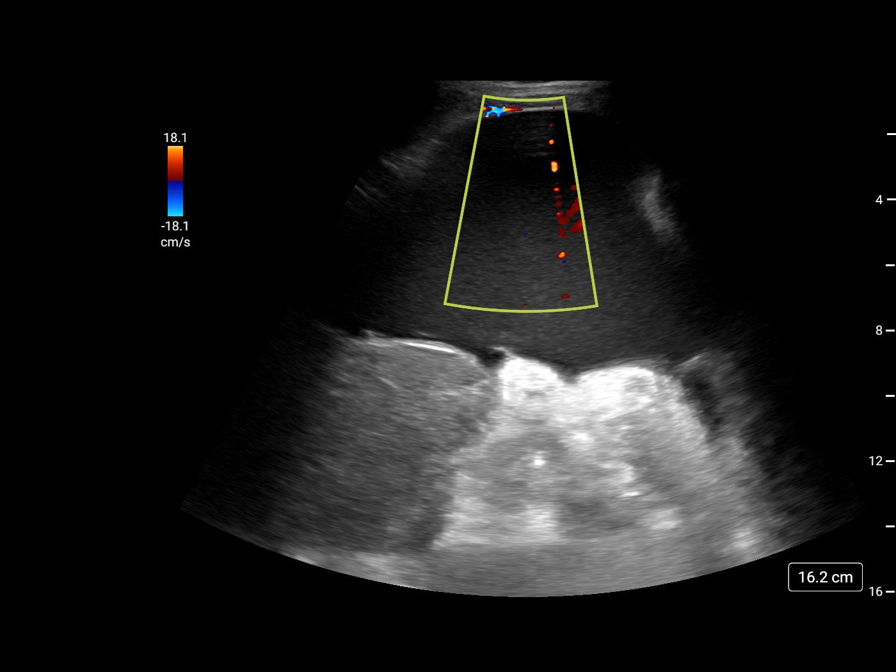
[im 3/4]
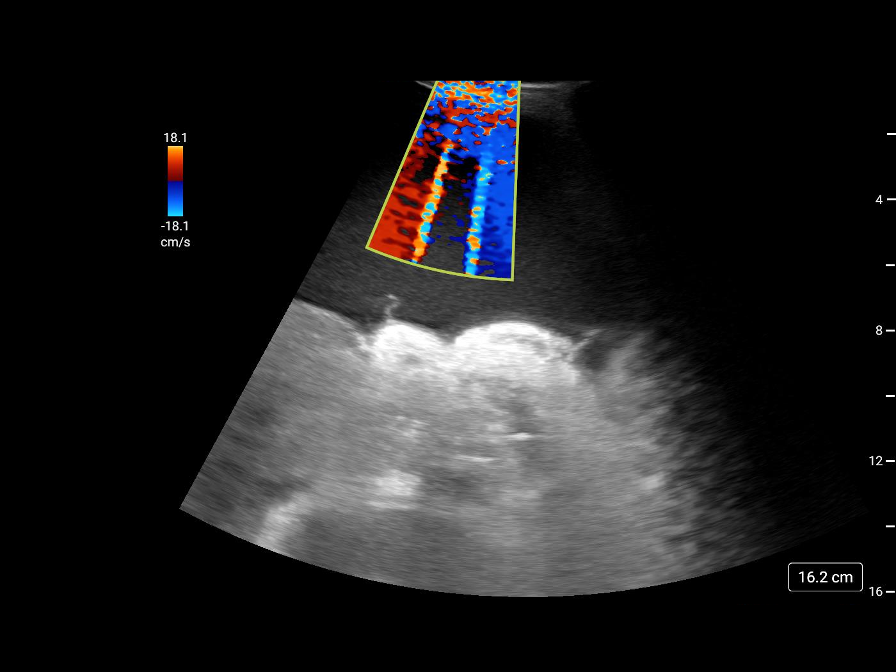
[im 4/4]
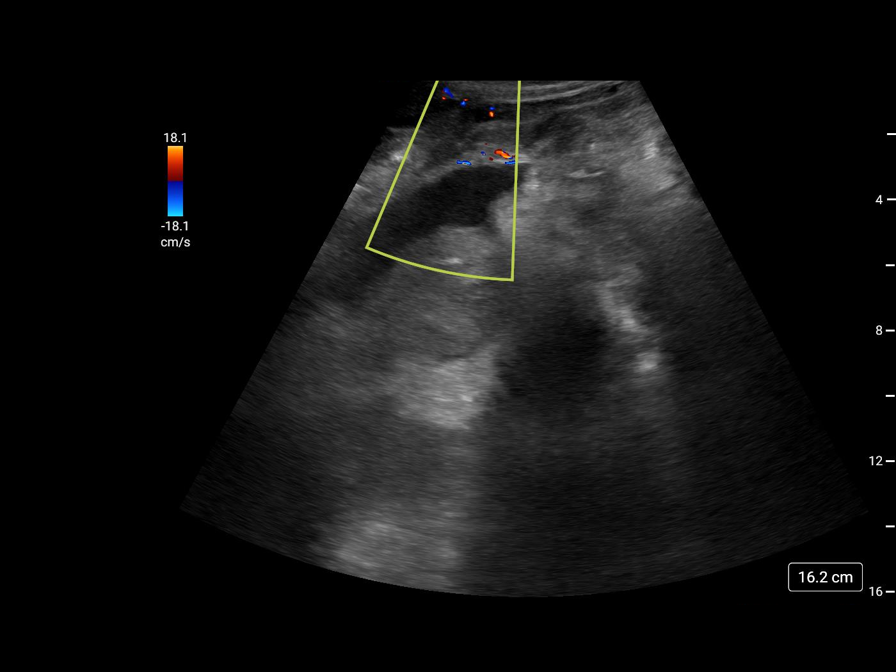

[4 of 4 positions shown; findings below may reference images not displayed]

EXAM:
ULTRASOUND GUIDED  PARACENTESIS

MEDICATIONS:
10 mL 1% lidocaine

COMPLICATIONS:
None immediate.

PROCEDURE:
Informed written consent was obtained from the patient after a
discussion of the risks, benefits and alternatives to treatment. A
timeout was performed prior to the initiation of the procedure.

Initial ultrasound scanning demonstrates a large amount of ascites
within the right lower abdominal quadrant. The right lower abdomen
was prepped and draped in the usual sterile fashion. 1% lidocaine
was used for local anesthesia.

Following this, a 19 gauge, 7-cm, Yueh catheter was introduced. An
ultrasound image was saved for documentation purposes. The
paracentesis was performed. The catheter was removed and a dressing
was applied. The patient tolerated the procedure well without
immediate post procedural complication.
FINDINGS: A total of approximately 3.2 L of hazy yellow fluid was removed.
Samples were sent to the laboratory as requested by the clinical
team.
IMPRESSION: Successful ultrasound-guided paracentesis yielding 3.2 liters of
peritoneal fluid.
